# Patient Record
Sex: Male | Born: 1940
Health system: Southern US, Community
[De-identification: ages and names within clinical notes are randomized; demographics above are authoritative.]

## PROBLEM LIST (undated history)

## (undated) DIAGNOSIS — C61 Malignant neoplasm of prostate: Secondary | ICD-10-CM

## (undated) DIAGNOSIS — N200 Calculus of kidney: Secondary | ICD-10-CM

## (undated) DIAGNOSIS — I4891 Unspecified atrial fibrillation: Secondary | ICD-10-CM

## (undated) DIAGNOSIS — G4730 Sleep apnea, unspecified: Secondary | ICD-10-CM

## (undated) DIAGNOSIS — Z720 Tobacco use: Secondary | ICD-10-CM

## (undated) DIAGNOSIS — J449 Chronic obstructive pulmonary disease, unspecified: Secondary | ICD-10-CM

## (undated) DIAGNOSIS — N320 Bladder-neck obstruction: Secondary | ICD-10-CM

## (undated) DIAGNOSIS — Z9889 Other specified postprocedural states: Secondary | ICD-10-CM

## (undated) DIAGNOSIS — I10 Essential (primary) hypertension: Secondary | ICD-10-CM

## (undated) DIAGNOSIS — E7800 Pure hypercholesterolemia, unspecified: Secondary | ICD-10-CM

## (undated) DIAGNOSIS — Z8679 Personal history of other diseases of the circulatory system: Secondary | ICD-10-CM

## (undated) DIAGNOSIS — I7090 Unspecified atherosclerosis: Secondary | ICD-10-CM

## (undated) HISTORY — PX: PROSTATE BIOPSY: SHX241

## (undated) HISTORY — PX: ABDOMINAL AORTIC ANEURYSM REPAIR: SUR1152

## (undated) HISTORY — PX: CHOLECYSTECTOMY: SHX55

---

## 2014-06-04 ENCOUNTER — Ambulatory Visit (HOSPITAL_COMMUNITY)
Admission: RE | Admit: 2014-06-04 | Discharge: 2014-06-04 | Disposition: A | Discharge status: Home / Self Care | Payer: Medicare PPO | Source: Ambulatory Visit | Attending: Family Medicine | Admitting: Family Medicine

## 2014-06-04 ENCOUNTER — Other Ambulatory Visit (HOSPITAL_COMMUNITY): Payer: Self-pay | Admitting: Family Medicine

## 2014-06-04 DIAGNOSIS — Z87891 Personal history of nicotine dependence: Secondary | ICD-10-CM | POA: Insufficient documentation

## 2014-06-04 DIAGNOSIS — R05 Cough: Secondary | ICD-10-CM

## 2014-06-04 DIAGNOSIS — J439 Emphysema, unspecified: Secondary | ICD-10-CM | POA: Diagnosis not present

## 2014-06-04 DIAGNOSIS — F17200 Nicotine dependence, unspecified, uncomplicated: Secondary | ICD-10-CM

## 2014-06-04 DIAGNOSIS — R059 Cough, unspecified: Secondary | ICD-10-CM

## 2014-10-31 DIAGNOSIS — I10 Essential (primary) hypertension: Secondary | ICD-10-CM | POA: Diagnosis not present

## 2014-10-31 DIAGNOSIS — G4730 Sleep apnea, unspecified: Secondary | ICD-10-CM | POA: Diagnosis not present

## 2014-10-31 DIAGNOSIS — J449 Chronic obstructive pulmonary disease, unspecified: Secondary | ICD-10-CM | POA: Diagnosis not present

## 2014-10-31 DIAGNOSIS — F17200 Nicotine dependence, unspecified, uncomplicated: Secondary | ICD-10-CM | POA: Diagnosis not present

## 2014-12-28 DIAGNOSIS — G4733 Obstructive sleep apnea (adult) (pediatric): Secondary | ICD-10-CM | POA: Diagnosis not present

## 2015-01-27 DIAGNOSIS — G4733 Obstructive sleep apnea (adult) (pediatric): Secondary | ICD-10-CM | POA: Diagnosis not present

## 2015-02-06 ENCOUNTER — Emergency Department (HOSPITAL_COMMUNITY): Payer: Commercial Managed Care - HMO

## 2015-02-06 ENCOUNTER — Encounter (HOSPITAL_COMMUNITY): Payer: Self-pay

## 2015-02-06 ENCOUNTER — Emergency Department (HOSPITAL_COMMUNITY)
Admission: EM | Admit: 2015-02-06 | Discharge: 2015-02-06 | Disposition: A | Discharge status: Home / Self Care | Payer: Commercial Managed Care - HMO | Attending: Emergency Medicine | Admitting: Emergency Medicine

## 2015-02-06 DIAGNOSIS — R05 Cough: Secondary | ICD-10-CM | POA: Diagnosis not present

## 2015-02-06 DIAGNOSIS — J40 Bronchitis, not specified as acute or chronic: Secondary | ICD-10-CM | POA: Diagnosis not present

## 2015-02-06 DIAGNOSIS — Z7982 Long term (current) use of aspirin: Secondary | ICD-10-CM | POA: Diagnosis not present

## 2015-02-06 DIAGNOSIS — J449 Chronic obstructive pulmonary disease, unspecified: Secondary | ICD-10-CM | POA: Diagnosis not present

## 2015-02-06 DIAGNOSIS — J441 Chronic obstructive pulmonary disease with (acute) exacerbation: Secondary | ICD-10-CM | POA: Diagnosis not present

## 2015-02-06 DIAGNOSIS — R000 Tachycardia, unspecified: Secondary | ICD-10-CM | POA: Diagnosis not present

## 2015-02-06 DIAGNOSIS — R0602 Shortness of breath: Secondary | ICD-10-CM | POA: Diagnosis not present

## 2015-02-06 DIAGNOSIS — Z79899 Other long term (current) drug therapy: Secondary | ICD-10-CM | POA: Insufficient documentation

## 2015-02-06 DIAGNOSIS — E780 Pure hypercholesterolemia: Secondary | ICD-10-CM | POA: Diagnosis not present

## 2015-02-06 DIAGNOSIS — Z720 Tobacco use: Secondary | ICD-10-CM | POA: Diagnosis not present

## 2015-02-06 DIAGNOSIS — I10 Essential (primary) hypertension: Secondary | ICD-10-CM | POA: Insufficient documentation

## 2015-02-06 DIAGNOSIS — F17210 Nicotine dependence, cigarettes, uncomplicated: Secondary | ICD-10-CM | POA: Diagnosis not present

## 2015-02-06 HISTORY — DX: Essential (primary) hypertension: I10

## 2015-02-06 HISTORY — DX: Chronic obstructive pulmonary disease, unspecified: J44.9

## 2015-02-06 HISTORY — DX: Pure hypercholesterolemia, unspecified: E78.00

## 2015-02-06 LAB — BASIC METABOLIC PANEL
Anion gap: 12 (ref 5–15)
BUN: 40 mg/dL — ABNORMAL HIGH (ref 6–20)
CO2: 33 mmol/L — ABNORMAL HIGH (ref 22–32)
Calcium: 9.3 mg/dL (ref 8.9–10.3)
Chloride: 93 mmol/L — ABNORMAL LOW (ref 101–111)
Creatinine, Ser: 0.79 mg/dL (ref 0.61–1.24)
GFR calc Af Amer: >60 mL/min (ref >60)
GFR calc non Af Amer: >60 mL/min (ref >60)
Glucose, Bld: 132 mg/dL — ABNORMAL HIGH (ref 65–99)
Potassium: 3.7 mmol/L (ref 3.5–5.1)
Sodium: 138 mmol/L (ref 135–145)

## 2015-02-06 LAB — CBC
HCT: 40.6 % (ref 39.0–52.0)
HEMOGLOBIN: 13.5 g/dL (ref 13.0–17.0)
MCH: 30.2 pg (ref 26.0–34.0)
MCHC: 33.3 g/dL (ref 30.0–36.0)
MCV: 90.8 fL (ref 78.0–100.0)
Platelets: 259 10*3/uL (ref 150–400)
RBC: 4.47 MIL/uL (ref 4.22–5.81)
RDW: 13.7 % (ref 11.5–15.5)
WBC: 23.7 10*3/uL — AB (ref 4.0–10.5)

## 2015-02-06 LAB — TROPONIN I: TROPONIN I: 0.03 ng/mL (ref <0.031)

## 2015-02-06 MED ORDER — PREDNISONE 50 MG PO TABS: ORAL_TABLET | ORAL | Status: DC

## 2015-02-06 MED ORDER — IPRATROPIUM-ALBUTEROL 0.5-2.5 (3) MG/3ML IN SOLN
3.0000 mL | Freq: Once | RESPIRATORY_TRACT | Status: AC
  Administered 2015-02-06: 3 mL via RESPIRATORY_TRACT
  Filled 2015-02-06: qty 3

## 2015-02-06 MED ORDER — AZITHROMYCIN 250 MG PO TABS: ORAL_TABLET | ORAL | Status: DC

## 2015-02-06 MED ORDER — ALBUTEROL SULFATE HFA 108 (90 BASE) MCG/ACT IN AERS: 1.0000 | INHALATION_SPRAY | Freq: Four times a day (QID) | RESPIRATORY_TRACT | Status: DC | PRN

## 2015-02-06 NOTE — Discharge Instructions (Signed)
X-ray shows no obvious pneumonia. Stop smoking. Prescription for antibiotic, prednisone, inhaler. Follow-up your care doctor.

## 2015-02-06 NOTE — ED Notes (Signed)
Patient reports that he started coughing Monday. Some SOB. Productive cough. Went to urgent care this am and was sent here due to sats in 80's

## 2015-02-06 NOTE — ED Provider Notes (Signed)
CSN: [id number]     Arrival date & time 02/06/15  1108 History  This chart was scribed for Nat Christen, MD by Rayna Sexton, ED scribe. This patient was seen in room APA03/APA03 and the patient's care was started at 12:01 PM.    Chief Complaint  Patient presents with  . Shortness of Breath  . Cough    The history is provided by the patient and the spouse. No language interpreter was used.    HPI Comments: Steve Gomez is a 74 y.o. male, with a history of COPD, smoking and HTN, who presents to the Emergency Department complaining of an intermittent, moderate, productive cough with onset 4 days ago.  Pt confirms intermittent SOB. Pt's wife notes he uses oxygen when sleeping but denies use of any at home breathing treatments or his prescribed inhalers. Pt's wife additionally mentioned that he is still a regular smoker. Pt denies wheezing or any other associated symptoms.    Past Medical History  Diagnosis Date  . COPD (chronic obstructive pulmonary disease)   . Hypertension   . High cholesterol    History reviewed. No pertinent past surgical history. No family history on file. History  Substance Use Topics  . Smoking status: Current Every Day Smoker -- 1.00 packs/day    Types: Cigarettes  . Smokeless tobacco: Not on file  . Alcohol Use: No    Review of Systems  Respiratory: Positive for cough and shortness of breath.   A complete 10 system review of systems was obtained and all systems are negative except as noted in the HPI and PMH.      Allergies  Review of patient's allergies indicates no known allergies.  Home Medications   Prior to Admission medications   Medication Sig Start Date End Date Taking? Authorizing Provider  aspirin EC 81 MG tablet Take 81 mg by mouth daily.   Yes Historical Provider, MD  chlorthalidone (HYGROTON) 25 MG tablet Take 1 tablet by mouth daily. 12/01/14  Yes Historical Provider, MD  Coenzyme Q10 (COQ10 PO) Take 1 tablet by mouth daily.   Yes  Historical Provider, MD  labetalol (NORMODYNE) 300 MG tablet Take 1 tablet by mouth 2 (two) times daily. 12/01/14  Yes Historical Provider, MD  Multiple Vitamins-Minerals (MULTIVITAMINS THER. W/MINERALS) TABS tablet Take 1 tablet by mouth daily. 11/30/14  Yes Historical Provider, MD  simvastatin (ZOCOR) 40 MG tablet Take 1 tablet by mouth daily. 12/01/14  Yes Historical Provider, MD  albuterol (PROVENTIL HFA;VENTOLIN HFA) 108 (90 BASE) MCG/ACT inhaler Inhale 1-2 puffs into the lungs every 6 (six) hours as needed for wheezing or shortness of breath. 02/06/15   Nat Christen, MD  azithromycin (ZITHROMAX Z-PAK) 250 MG tablet Two tabs day one, one tab day 2-5 02/06/15   Nat Christen, MD  predniSONE (DELTASONE) 50 MG tablet 1 tablet for 5 days, one half tablet for 5 days 02/06/15   Nat Christen, MD   BP 120/64 mmHg  Pulse 75  Temp(Src) 98.4 F (36.9 C) (Oral)  Resp 33  Ht 5\' 4"  (1.626 m)  Wt 130 lb (58.968 kg)  BMI 22.30 kg/m2  SpO2 92% Physical Exam  Constitutional: He is oriented to person, place, and time.  HENT:  Head: Normocephalic and atraumatic.  Eyes: Conjunctivae and EOM are normal. Pupils are equal, round, and reactive to light.  Neck: Normal range of motion. Neck supple.  Cardiovascular: Normal rate and regular rhythm.   Pulmonary/Chest:  Slight tachypnea and dyspnea Scattered rhonchi   Abdominal: Soft.  Bowel sounds are normal.  Musculoskeletal: Normal range of motion.  Neurological: He is alert and oriented to person, place, and time.  Skin: Skin is warm and dry.  Psychiatric: He has a normal mood and affect. His behavior is normal.  Nursing note and vitals reviewed.   ED Course  Procedures  DIAGNOSTIC STUDIES:  Oxygen Saturation is 84% on RA, low by my interpretation.    COORDINATION OF CARE: 12:05 PM Discussed treatment plan with pt at bedside including a breathing treatment and a CXR and pt agreed to plan.  Labs Review Labs Reviewed  BASIC METABOLIC PANEL - Abnormal; Notable for  the following:    Chloride 93 (*)    CO2 33 (*)    Glucose, Bld 132 (*)    BUN 40 (*)    All other components within normal limits  CBC - Abnormal; Notable for the following:    WBC 23.7 (*)    All other components within normal limits  TROPONIN I    Imaging Review Dg Chest 2 View (if Patient Has Fever And/or Copd)  02/06/2015   CLINICAL DATA:  Shortness of breath and productive cough. History of COPD. Current smoker.  EXAM: CHEST - 2 VIEW  COMPARISON:  06/04/2014  FINDINGS: Lungs show marked hyperinflation. Changes of COPD/emphysema appear slightly progressive since the prior chest x-ray. There is no evidence of pulmonary edema, consolidation, pneumothorax, nodule or pleural fluid. The heart size is stable and within normal limits. There is stable tortuosity of the thoracic aorta. Stable spondylosis and scoliosis of the thoracic spine.  IMPRESSION: Marked pulmonary hyperinflation with some probable progression of COPD/emphysema since the prior study. No focal infiltrate or evidence of pneumothorax.   Electronically Signed   By: Aletta Edouard M.D.   On: 02/06/2015 12:38     EKG Interpretation   Date/Time:  Thursday February 06 2015 11:30:24 EDT Ventricular Rate:  81 PR Interval:  171 QRS Duration: 126 QT Interval:  399 QTC Calculation: 463 R Axis:   -81 Text Interpretation:  Sinus rhythm Atrial premature complexes Nonspecific  IVCD with LAD Nonspecific T abnormalities, lateral leads ST elevation,  consider inferior injury Baseline wander in lead(s) II III aVF V3  Confirmed by Tajon Moring  MD, Musette Kisamore ([id number]) on 02/06/2015 12:59:42 PM      MDM   Final diagnoses:  Bronchitis  COPD exacerbation    Patient has known severe COPD. He is normally on home oxygen as needed. Chest x-ray shows no obvious pneumonia. His wife reports him to be at baseline. Will Rx Zithromax, prednisone, albuterol inhaler.   I personally performed the services described in this documentation, which was scribed in my  presence. The recorded information has been reviewed and is accurate.    Nat Christen, MD 02/06/15 1436

## 2015-02-11 DIAGNOSIS — G4733 Obstructive sleep apnea (adult) (pediatric): Secondary | ICD-10-CM | POA: Diagnosis not present

## 2015-02-27 DIAGNOSIS — G4733 Obstructive sleep apnea (adult) (pediatric): Secondary | ICD-10-CM | POA: Diagnosis not present

## 2015-03-24 ENCOUNTER — Other Ambulatory Visit (HOSPITAL_COMMUNITY): Payer: Self-pay | Admitting: Pulmonary Disease

## 2015-03-24 DIAGNOSIS — R0602 Shortness of breath: Secondary | ICD-10-CM

## 2015-03-24 DIAGNOSIS — R634 Abnormal weight loss: Secondary | ICD-10-CM | POA: Diagnosis not present

## 2015-03-27 ENCOUNTER — Encounter (HOSPITAL_COMMUNITY): Payer: Self-pay

## 2015-03-27 ENCOUNTER — Ambulatory Visit (HOSPITAL_COMMUNITY)
Admission: RE | Admit: 2015-03-27 | Discharge: 2015-03-27 | Disposition: A | Discharge status: Home / Self Care | Payer: Commercial Managed Care - HMO | Source: Ambulatory Visit | Attending: Pulmonary Disease | Admitting: Pulmonary Disease

## 2015-03-27 DIAGNOSIS — I2510 Atherosclerotic heart disease of native coronary artery without angina pectoris: Secondary | ICD-10-CM | POA: Diagnosis not present

## 2015-03-27 DIAGNOSIS — I700 Atherosclerosis of aorta: Secondary | ICD-10-CM | POA: Diagnosis not present

## 2015-03-27 DIAGNOSIS — R918 Other nonspecific abnormal finding of lung field: Secondary | ICD-10-CM | POA: Diagnosis not present

## 2015-03-27 DIAGNOSIS — R0602 Shortness of breath: Secondary | ICD-10-CM | POA: Diagnosis not present

## 2015-03-27 DIAGNOSIS — J984 Other disorders of lung: Secondary | ICD-10-CM | POA: Diagnosis not present

## 2015-03-27 HISTORY — DX: Other specified postprocedural states: Z86.79

## 2015-03-27 HISTORY — DX: Other specified postprocedural states: Z98.890

## 2015-03-27 LAB — POCT I-STAT CREATININE: CREATININE: 0.7 mg/dL (ref 0.61–1.24)

## 2015-03-27 MED ORDER — IOHEXOL 300 MG/ML  SOLN
75.0000 mL | Freq: Once | INTRAMUSCULAR | Status: AC | PRN
  Administered 2015-03-27: 75 mL via INTRAVENOUS

## 2015-03-29 DIAGNOSIS — G4733 Obstructive sleep apnea (adult) (pediatric): Secondary | ICD-10-CM | POA: Diagnosis not present

## 2015-04-24 DIAGNOSIS — G4733 Obstructive sleep apnea (adult) (pediatric): Secondary | ICD-10-CM | POA: Diagnosis not present

## 2015-04-29 DIAGNOSIS — G4733 Obstructive sleep apnea (adult) (pediatric): Secondary | ICD-10-CM | POA: Diagnosis not present

## 2015-05-07 DIAGNOSIS — I10 Essential (primary) hypertension: Secondary | ICD-10-CM | POA: Diagnosis not present

## 2015-05-07 DIAGNOSIS — J449 Chronic obstructive pulmonary disease, unspecified: Secondary | ICD-10-CM | POA: Diagnosis not present

## 2015-05-07 DIAGNOSIS — R634 Abnormal weight loss: Secondary | ICD-10-CM | POA: Diagnosis not present

## 2015-05-07 DIAGNOSIS — G4733 Obstructive sleep apnea (adult) (pediatric): Secondary | ICD-10-CM | POA: Diagnosis not present

## 2015-05-30 DIAGNOSIS — G4733 Obstructive sleep apnea (adult) (pediatric): Secondary | ICD-10-CM | POA: Diagnosis not present

## 2015-06-19 DIAGNOSIS — G4733 Obstructive sleep apnea (adult) (pediatric): Secondary | ICD-10-CM | POA: Diagnosis not present

## 2015-06-29 DIAGNOSIS — G4733 Obstructive sleep apnea (adult) (pediatric): Secondary | ICD-10-CM | POA: Diagnosis not present

## 2015-07-07 DIAGNOSIS — S42293A Other displaced fracture of upper end of unspecified humerus, initial encounter for closed fracture: Secondary | ICD-10-CM | POA: Diagnosis not present

## 2015-07-14 ENCOUNTER — Ambulatory Visit (INDEPENDENT_AMBULATORY_CARE_PROVIDER_SITE_OTHER): Payer: Commercial Managed Care - HMO | Admitting: Orthopedic Surgery

## 2015-07-14 ENCOUNTER — Ambulatory Visit (INDEPENDENT_AMBULATORY_CARE_PROVIDER_SITE_OTHER): Payer: Commercial Managed Care - HMO

## 2015-07-14 VITALS — BP 148/87 | Ht 64.0 in | Wt 130.0 lb

## 2015-07-14 DIAGNOSIS — M25512 Pain in left shoulder: Secondary | ICD-10-CM

## 2015-07-14 NOTE — Patient Instructions (Signed)
Do exercises daily   Take sling off Thursday

## 2015-07-14 NOTE — Addendum Note (Signed)
Addended by: Arther Abbott E on: 07/14/2015 12:00 PM   Modules accepted: Level of Service

## 2015-07-14 NOTE — Progress Notes (Signed)
Patient ID: Steve Gomez, male   DOB: [birth year], 74 y.o.   MRN: [id number]  Chief Complaint  Patient presents with  . Shoulder Injury    left shoulder fracture, DOI 07/06/15 referred by Susann Givens    HPI Steve Gomez is a 74 y.o. male.  Left shoulder injury one week ago on November 6. Patient fell while helping his relatives put a roof on a house. He was on the ground fell backwards put his left arm back and felt pain and swelling. He was seen at urgent care and was diagnosed with a nondisplaced proximal humerus fracture of the left shoulder.  Now complains of a dull aching pain 2 out of 10 left proximal humerus with no radiation but pain with movement  Review of systems neurologic denies numbness or tingling skin no lacerations or rashes fever constitutional negative    Review of Systems Review of Systems  Past Medical History  Diagnosis Date  . COPD (chronic obstructive pulmonary disease)   . Hypertension   . High cholesterol   . S/P AAA repair     Social History Social History  Substance Use Topics  . Smoking status: Current Every Day Smoker -- 1.00 packs/day    Types: Cigarettes  . Smokeless tobacco: Not on file  . Alcohol Use: No    No Known Allergies  Current Outpatient Prescriptions  Medication Sig Dispense Refill  . aspirin EC 81 MG tablet Take 81 mg by mouth daily.    . chlorthalidone (HYGROTON) 25 MG tablet Take 1 tablet by mouth daily.    . Coenzyme Q10 (COQ10 PO) Take 1 tablet by mouth daily.    Marland Kitchen labetalol (NORMODYNE) 300 MG tablet Take 1 tablet by mouth 2 (two) times daily.    . Multiple Vitamins-Minerals (MULTIVITAMINS THER. W/MINERALS) TABS tablet Take 1 tablet by mouth daily.    . simvastatin (ZOCOR) 40 MG tablet Take 1 tablet by mouth daily.     No current facility-administered medications for this visit.       Physical Exam Physical Exam Blood pressure 148/87, height 5\' 4"  (1.626 m), weight 130 lb (58.968 kg). Appearance, there are no  abnormalities in terms of appearance the patient was well-developed and well-nourished. The grooming and hygiene were normal.  Mental status orientation, there was normal alertness and orientation Mood pleasant Ambulatory status normal with no assistive devices  Examination of the left shoulder and proximal humerus Inspection small effusion left shoulder Range of motion very limited range of motion in all planes Tests for stability inferior subluxation test was normal Motor strength  no gross motor atrophy Skin warm dry and intact without laceration or ulceration or erythema Neurologic examination normal sensation Vascular examination normal pulses with warm extremity and normal capillary refill  The opposite extremity no tenderness or swelling    Data Reviewed He had 3 x-rays on a disc AP with internal and extra rotation and they showed glenohumeral arthritis, reports this fracture I don't see  Scapular Y view orthogonal views and still don't see a fracture  Assessment  Possible fracture left proximal humerus   Plan  Recommend active assisted range of motion exercises and four-week follow-up, he can remove the sling  Will not charge for fracture

## 2015-07-17 NOTE — Addendum Note (Signed)
Addended by: Carole Civil on: 07/17/2015 07:32 AM   Modules accepted: Level of Service

## 2015-07-18 ENCOUNTER — Other Ambulatory Visit (HOSPITAL_COMMUNITY): Payer: Self-pay | Admitting: Pulmonary Disease

## 2015-07-18 DIAGNOSIS — R911 Solitary pulmonary nodule: Secondary | ICD-10-CM

## 2015-07-20 DIAGNOSIS — G4733 Obstructive sleep apnea (adult) (pediatric): Secondary | ICD-10-CM | POA: Diagnosis not present

## 2015-07-23 ENCOUNTER — Ambulatory Visit (HOSPITAL_COMMUNITY)
Admission: RE | Admit: 2015-07-23 | Discharge: 2015-07-23 | Disposition: A | Discharge status: Home / Self Care | Payer: Commercial Managed Care - HMO | Source: Ambulatory Visit | Attending: Pulmonary Disease | Admitting: Pulmonary Disease

## 2015-07-23 DIAGNOSIS — R911 Solitary pulmonary nodule: Secondary | ICD-10-CM | POA: Diagnosis not present

## 2015-07-23 DIAGNOSIS — I2510 Atherosclerotic heart disease of native coronary artery without angina pectoris: Secondary | ICD-10-CM | POA: Diagnosis not present

## 2015-07-23 DIAGNOSIS — R918 Other nonspecific abnormal finding of lung field: Secondary | ICD-10-CM | POA: Insufficient documentation

## 2015-07-23 DIAGNOSIS — J432 Centrilobular emphysema: Secondary | ICD-10-CM | POA: Diagnosis not present

## 2015-07-23 DIAGNOSIS — N200 Calculus of kidney: Secondary | ICD-10-CM | POA: Diagnosis not present

## 2015-07-30 DIAGNOSIS — G4733 Obstructive sleep apnea (adult) (pediatric): Secondary | ICD-10-CM | POA: Diagnosis not present

## 2015-08-18 ENCOUNTER — Ambulatory Visit (INDEPENDENT_AMBULATORY_CARE_PROVIDER_SITE_OTHER): Payer: Commercial Managed Care - HMO | Admitting: Orthopedic Surgery

## 2015-08-18 ENCOUNTER — Encounter: Payer: Self-pay | Admitting: Orthopedic Surgery

## 2015-08-18 VITALS — BP 132/75 | Ht 64.0 in | Wt 130.0 lb

## 2015-08-18 DIAGNOSIS — M25512 Pain in left shoulder: Secondary | ICD-10-CM | POA: Diagnosis not present

## 2015-08-18 NOTE — Progress Notes (Signed)
Regular follow up shoulder pain left side  Chief Complaint  Patient presents with  . Follow-up    4 WEEK FOLLOW UP LEFT SHOULDER, DOI 07/06/15    The patient is left shoulder back in November was questionable fracture I didn't really think the x-ray showed fracture so we treated him for shoulder pain with injury and contusion  He comes in complaining of no discomfort at this time  Review of systems he denies any numbness or tingling in the left arm  BP 132/75 mmHg  Ht 5\' 4"  (1.626 m)  Wt 130 lb (58.968 kg)  BMI 22.30 kg/m2 He has no tenderness in the left shoulder he has no deformity compared right to left he has regained his preinjury range of motion which includes 150 of forward elevation and 50 of external rotation. His motor exam for his rotator cuff is grade 5 over 5 for the supraspinatus. His shoulder remain stable and his neurovascular exam is normal  Encounter Diagnosis  Name Primary?  . Left shoulder pain Yes    He can resume normal activities and follow-up with [place] as needed

## 2015-08-19 DIAGNOSIS — G4733 Obstructive sleep apnea (adult) (pediatric): Secondary | ICD-10-CM | POA: Diagnosis not present

## 2015-08-29 DIAGNOSIS — G4733 Obstructive sleep apnea (adult) (pediatric): Secondary | ICD-10-CM | POA: Diagnosis not present

## 2015-09-03 DIAGNOSIS — H5210 Myopia, unspecified eye: Secondary | ICD-10-CM | POA: Diagnosis not present

## 2015-09-03 DIAGNOSIS — H524 Presbyopia: Secondary | ICD-10-CM | POA: Diagnosis not present

## 2015-09-19 DIAGNOSIS — G4733 Obstructive sleep apnea (adult) (pediatric): Secondary | ICD-10-CM | POA: Diagnosis not present

## 2015-09-29 DIAGNOSIS — G4733 Obstructive sleep apnea (adult) (pediatric): Secondary | ICD-10-CM | POA: Diagnosis not present

## 2015-10-20 DIAGNOSIS — G4733 Obstructive sleep apnea (adult) (pediatric): Secondary | ICD-10-CM | POA: Diagnosis not present

## 2015-10-28 DIAGNOSIS — G4733 Obstructive sleep apnea (adult) (pediatric): Secondary | ICD-10-CM | POA: Diagnosis not present

## 2015-11-17 DIAGNOSIS — G4733 Obstructive sleep apnea (adult) (pediatric): Secondary | ICD-10-CM | POA: Diagnosis not present

## 2015-11-27 DIAGNOSIS — G4733 Obstructive sleep apnea (adult) (pediatric): Secondary | ICD-10-CM | POA: Diagnosis not present

## 2015-12-18 DIAGNOSIS — G4733 Obstructive sleep apnea (adult) (pediatric): Secondary | ICD-10-CM | POA: Diagnosis not present

## 2015-12-28 DIAGNOSIS — G4733 Obstructive sleep apnea (adult) (pediatric): Secondary | ICD-10-CM | POA: Diagnosis not present

## 2016-01-11 ENCOUNTER — Encounter (HOSPITAL_COMMUNITY): Payer: Self-pay | Admitting: Emergency Medicine

## 2016-01-11 ENCOUNTER — Inpatient Hospital Stay (HOSPITAL_COMMUNITY)
Admission: EM | Admit: 2016-01-11 | Discharge: 2016-01-17 | DRG: 699 | Disposition: A | Discharge status: Home / Self Care | Payer: Commercial Managed Care - HMO | Attending: Internal Medicine | Admitting: Internal Medicine

## 2016-01-11 ENCOUNTER — Emergency Department (HOSPITAL_COMMUNITY): Payer: Commercial Managed Care - HMO

## 2016-01-11 DIAGNOSIS — R338 Other retention of urine: Secondary | ICD-10-CM | POA: Diagnosis not present

## 2016-01-11 DIAGNOSIS — I10 Essential (primary) hypertension: Secondary | ICD-10-CM | POA: Diagnosis not present

## 2016-01-11 DIAGNOSIS — K5710 Diverticulosis of small intestine without perforation or abscess without bleeding: Secondary | ICD-10-CM | POA: Diagnosis present

## 2016-01-11 DIAGNOSIS — N202 Calculus of kidney with calculus of ureter: Secondary | ICD-10-CM | POA: Diagnosis present

## 2016-01-11 DIAGNOSIS — E876 Hypokalemia: Secondary | ICD-10-CM | POA: Diagnosis not present

## 2016-01-11 DIAGNOSIS — R1084 Generalized abdominal pain: Secondary | ICD-10-CM | POA: Diagnosis not present

## 2016-01-11 DIAGNOSIS — F17210 Nicotine dependence, cigarettes, uncomplicated: Secondary | ICD-10-CM | POA: Diagnosis not present

## 2016-01-11 DIAGNOSIS — K559 Vascular disorder of intestine, unspecified: Secondary | ICD-10-CM | POA: Diagnosis present

## 2016-01-11 DIAGNOSIS — R1030 Lower abdominal pain, unspecified: Secondary | ICD-10-CM | POA: Diagnosis not present

## 2016-01-11 DIAGNOSIS — N3090 Cystitis, unspecified without hematuria: Secondary | ICD-10-CM | POA: Diagnosis present

## 2016-01-11 DIAGNOSIS — N320 Bladder-neck obstruction: Secondary | ICD-10-CM | POA: Diagnosis not present

## 2016-01-11 DIAGNOSIS — R109 Unspecified abdominal pain: Secondary | ICD-10-CM | POA: Diagnosis not present

## 2016-01-11 DIAGNOSIS — N200 Calculus of kidney: Secondary | ICD-10-CM | POA: Diagnosis not present

## 2016-01-11 DIAGNOSIS — R1010 Upper abdominal pain, unspecified: Secondary | ICD-10-CM | POA: Diagnosis not present

## 2016-01-11 DIAGNOSIS — I708 Atherosclerosis of other arteries: Secondary | ICD-10-CM | POA: Diagnosis not present

## 2016-01-11 DIAGNOSIS — E7800 Pure hypercholesterolemia, unspecified: Secondary | ICD-10-CM | POA: Diagnosis not present

## 2016-01-11 DIAGNOSIS — R0602 Shortness of breath: Secondary | ICD-10-CM | POA: Diagnosis not present

## 2016-01-11 DIAGNOSIS — Z79899 Other long term (current) drug therapy: Secondary | ICD-10-CM | POA: Diagnosis not present

## 2016-01-11 DIAGNOSIS — J449 Chronic obstructive pulmonary disease, unspecified: Secondary | ICD-10-CM | POA: Diagnosis present

## 2016-01-11 DIAGNOSIS — D72829 Elevated white blood cell count, unspecified: Secondary | ICD-10-CM | POA: Diagnosis not present

## 2016-01-11 DIAGNOSIS — G4733 Obstructive sleep apnea (adult) (pediatric): Secondary | ICD-10-CM | POA: Diagnosis not present

## 2016-01-11 DIAGNOSIS — R1032 Left lower quadrant pain: Secondary | ICD-10-CM | POA: Diagnosis not present

## 2016-01-11 DIAGNOSIS — I959 Hypotension, unspecified: Secondary | ICD-10-CM | POA: Diagnosis not present

## 2016-01-11 DIAGNOSIS — R1011 Right upper quadrant pain: Secondary | ICD-10-CM | POA: Diagnosis present

## 2016-01-11 DIAGNOSIS — R1012 Left upper quadrant pain: Secondary | ICD-10-CM | POA: Diagnosis not present

## 2016-01-11 DIAGNOSIS — R112 Nausea with vomiting, unspecified: Secondary | ICD-10-CM | POA: Diagnosis not present

## 2016-01-11 DIAGNOSIS — I4891 Unspecified atrial fibrillation: Secondary | ICD-10-CM | POA: Diagnosis present

## 2016-01-11 DIAGNOSIS — R5381 Other malaise: Secondary | ICD-10-CM | POA: Diagnosis not present

## 2016-01-11 DIAGNOSIS — Z7982 Long term (current) use of aspirin: Secondary | ICD-10-CM | POA: Diagnosis not present

## 2016-01-11 HISTORY — DX: Bladder-neck obstruction: N32.0

## 2016-01-11 HISTORY — DX: Unspecified atherosclerosis: I70.90

## 2016-01-11 HISTORY — DX: Calculus of kidney: N20.0

## 2016-01-11 HISTORY — DX: Tobacco use: Z72.0

## 2016-01-11 HISTORY — DX: Unspecified atrial fibrillation: I48.91

## 2016-01-11 LAB — CBC WITH DIFFERENTIAL/PLATELET
BASOS ABS: 0 10*3/uL (ref 0.0–0.1)
BASOS PCT: 0 %
EOS ABS: 0.1 10*3/uL (ref 0.0–0.7)
Eosinophils Relative: 1 %
HCT: 32.4 % — ABNORMAL LOW (ref 39.0–52.0)
Hemoglobin: 11 g/dL — ABNORMAL LOW (ref 13.0–17.0)
Lymphocytes Relative: 8 %
Lymphs Abs: 1.6 10*3/uL (ref 0.7–4.0)
MCH: 29.6 pg (ref 26.0–34.0)
MCHC: 34 g/dL (ref 30.0–36.0)
MCV: 87.3 fL (ref 78.0–100.0)
MONOS PCT: 7 %
Monocytes Absolute: 1.4 10*3/uL — ABNORMAL HIGH (ref 0.1–1.0)
NEUTROS ABS: 16.7 10*3/uL — AB (ref 1.7–7.7)
Neutrophils Relative %: 84 %
Platelets: 332 10*3/uL (ref 150–400)
RBC: 3.71 MIL/uL — ABNORMAL LOW (ref 4.22–5.81)
RDW: 14.3 % (ref 11.5–15.5)
WBC: 19.8 10*3/uL — ABNORMAL HIGH (ref 4.0–10.5)

## 2016-01-11 LAB — COMPREHENSIVE METABOLIC PANEL
ALK PHOS: 124 U/L (ref 38–126)
ALT: 12 U/L — AB (ref 17–63)
ANION GAP: 9 (ref 5–15)
AST: 20 U/L (ref 15–41)
Albumin: 3.5 g/dL (ref 3.5–5.0)
BILIRUBIN TOTAL: 0.5 mg/dL (ref 0.3–1.2)
BUN: 24 mg/dL — ABNORMAL HIGH (ref 6–20)
CALCIUM: 9.2 mg/dL (ref 8.9–10.3)
CO2: 31 mmol/L (ref 22–32)
CREATININE: 0.78 mg/dL (ref 0.61–1.24)
Chloride: 94 mmol/L — ABNORMAL LOW (ref 101–111)
GFR calc Af Amer: >60 mL/min (ref >60)
GFR calc non Af Amer: >60 mL/min (ref >60)
Glucose, Bld: 129 mg/dL — ABNORMAL HIGH (ref 65–99)
Potassium: 2.8 mmol/L — ABNORMAL LOW (ref 3.5–5.1)
SODIUM: 134 mmol/L — AB (ref 135–145)
TOTAL PROTEIN: 6.9 g/dL (ref 6.5–8.1)

## 2016-01-11 LAB — URINALYSIS, ROUTINE W REFLEX MICROSCOPIC
BILIRUBIN URINE: NEGATIVE
Glucose, UA: NEGATIVE mg/dL
Hgb urine dipstick: NEGATIVE
KETONES UR: NEGATIVE mg/dL
LEUKOCYTES UA: NEGATIVE
NITRITE: NEGATIVE
Protein, ur: NEGATIVE mg/dL
Specific Gravity, Urine: 1.015 (ref 1.005–1.030)
pH: 6.5 (ref 5.0–8.0)

## 2016-01-11 LAB — PROTIME-INR
INR: 1.24 (ref 0.00–1.49)
Prothrombin Time: 15.8 seconds — ABNORMAL HIGH (ref 11.6–15.2)

## 2016-01-11 LAB — APTT: aPTT: 32 seconds (ref 24–37)

## 2016-01-11 LAB — TROPONIN I

## 2016-01-11 LAB — LIPASE, BLOOD: Lipase: 31 U/L (ref 11–51)

## 2016-01-11 MED ORDER — SODIUM CHLORIDE 0.9 % IV BOLUS (SEPSIS)
500.0000 mL | Freq: Once | INTRAVENOUS | Status: AC
  Administered 2016-01-11: 500 mL via INTRAVENOUS

## 2016-01-11 MED ORDER — SODIUM CHLORIDE 0.9 % IV BOLUS (SEPSIS)
1000.0000 mL | Freq: Once | INTRAVENOUS | Status: AC
  Administered 2016-01-11: 1000 mL via INTRAVENOUS

## 2016-01-11 MED ORDER — IOPAMIDOL (ISOVUE-300) INJECTION 61%
100.0000 mL | Freq: Once | INTRAVENOUS | Status: AC | PRN
  Administered 2016-01-11: 100 mL via INTRAVENOUS

## 2016-01-11 MED ORDER — DILTIAZEM HCL 100 MG IV SOLR
5.0000 mg/h | Freq: Once | INTRAVENOUS | Status: AC
  Administered 2016-01-12: 5 mg/h via INTRAVENOUS
  Filled 2016-01-11: qty 100

## 2016-01-11 MED ORDER — ONDANSETRON HCL 4 MG/2ML IJ SOLN
4.0000 mg | Freq: Once | INTRAMUSCULAR | Status: AC
  Administered 2016-01-11: 4 mg via INTRAVENOUS
  Filled 2016-01-11: qty 2

## 2016-01-11 MED ORDER — MORPHINE SULFATE (PF) 4 MG/ML IV SOLN
4.0000 mg | Freq: Once | INTRAVENOUS | Status: AC
  Administered 2016-01-11: 4 mg via INTRAVENOUS
  Filled 2016-01-11: qty 1

## 2016-01-11 MED ORDER — HEPARIN BOLUS VIA INFUSION: 3400.0000 [IU] | Freq: Once | INTRAVENOUS | Status: DC

## 2016-01-11 MED ORDER — POTASSIUM CHLORIDE 10 MEQ/100ML IV SOLN
10.0000 meq | Freq: Once | INTRAVENOUS | Status: AC
  Administered 2016-01-11: 10 meq via INTRAVENOUS
  Filled 2016-01-11: qty 100

## 2016-01-11 MED ORDER — HEPARIN (PORCINE) IN NACL 100-0.45 UNIT/ML-% IJ SOLN: 850.0000 [IU]/h | INTRAMUSCULAR | Status: DC

## 2016-01-11 MED ORDER — HEPARIN SODIUM (PORCINE) 5000 UNIT/ML IJ SOLN: 60.0000 [IU]/kg | Freq: Once | INTRAMUSCULAR | Status: DC

## 2016-01-11 NOTE — ED Notes (Signed)
Patient complaining of abdominal pain x 1 week. States he vomited x 1 today.

## 2016-01-11 NOTE — ED Provider Notes (Signed)
CSN: [id number]     Arrival date & time 01/11/16  1859 History   First MD Initiated Contact with Patient 01/11/16 1909     Chief Complaint  Patient presents with  . Abdominal Pain   PT SAID THAT HE HAS HAD ABD PAIN FOR THE PAST WEEK.  PT DID VOMIT ONCE TODAY.  PT DENIES ANY PAIN RIGHT NOW.  (Consider location/radiation/quality/duration/timing/severity/associated sxs/prior Treatment) Patient is a 75 y.o. male presenting with abdominal pain. The history is provided by the patient.  Abdominal Pain Pain location:  Generalized Pain quality: aching   Pain radiates to:  Does not radiate Pain severity:  Mild Onset quality:  Gradual Timing:  Intermittent Chronicity:  New Associated symptoms: nausea and vomiting     Past Medical History  Diagnosis Date  . COPD (chronic obstructive pulmonary disease) ([place])   . Hypertension   . High cholesterol   . S/P AAA repair    History reviewed. No pertinent past surgical history. History reviewed. No pertinent family history. Social History  Substance Use Topics  . Smoking status: Current Every Day Smoker -- 1.00 packs/day    Types: Cigarettes  . Smokeless tobacco: None  . Alcohol Use: Yes     Comment: "just a couple of swallows of wine every night"    Review of Systems  Gastrointestinal: Positive for nausea, vomiting and abdominal pain.  All other systems reviewed and are negative.     Allergies  Review of patient's allergies indicates no known allergies.  Home Medications   Prior to Admission medications   Medication Sig Start Date End Date Taking? Authorizing Provider  acetaminophen (TYLENOL) 500 MG tablet Take 500 mg by mouth every 6 (six) hours as needed for mild pain or moderate pain.   Yes Historical Provider, MD  aspirin EC 81 MG tablet Take 81 mg by mouth daily.   Yes Historical Provider, MD  chlorthalidone (HYGROTON) 25 MG tablet Take 1 tablet by mouth daily. 12/01/14  Yes Historical Provider, MD  labetalol (NORMODYNE) 300  MG tablet Take 1 tablet by mouth 2 (two) times daily. 12/01/14  Yes Historical Provider, MD  Multiple Vitamins-Minerals (MULTIVITAMINS THER. W/MINERALS) TABS tablet Take 1 tablet by mouth daily. 11/30/14  Yes Historical Provider, MD  OXYGEN Inhale 2 L into the lungs at bedtime.   Yes Historical Provider, MD  simvastatin (ZOCOR) 40 MG tablet Take 1 tablet by mouth daily. 12/01/14  Yes Historical Provider, MD   BP 122/76 mmHg  Pulse 102  Temp(Src) 98.4 F (36.9 C) (Oral)  Resp 20  Ht 5\' 4"  (1.626 m)  Wt 116 lb 13.5 oz (53 kg)  BMI 20.05 kg/m2  SpO2 95% Physical Exam  Constitutional: He is oriented to person, place, and time. He appears well-developed and well-nourished.  HENT:  Head: Normocephalic and atraumatic.  Right Ear: External ear normal.  Left Ear: External ear normal.  Nose: Nose normal.  Mouth/Throat: Oropharynx is clear and moist.  Eyes: Conjunctivae and EOM are normal. Pupils are equal, round, and reactive to light.  Neck: Normal range of motion. Neck supple.  Cardiovascular: Normal rate, regular rhythm, normal heart sounds and intact distal pulses.   Pulmonary/Chest: Effort normal and breath sounds normal.  Abdominal: Soft. Bowel sounds are normal.  Musculoskeletal: Normal range of motion.  Neurological: He is alert and oriented to person, place, and time.  Skin: Skin is warm and dry.  Psychiatric: He has a normal mood and affect. His behavior is normal. Judgment and thought content normal.  Nursing note and vitals reviewed.   ED Course  Procedures (including critical care time) Labs Review Labs Reviewed  COMPREHENSIVE METABOLIC PANEL - Abnormal; Notable for the following:    Sodium 134 (*)    Potassium 2.8 (*)    Chloride 94 (*)    Glucose, Bld 129 (*)    BUN 24 (*)    ALT 12 (*)    All other components within normal limits  CBC WITH DIFFERENTIAL/PLATELET - Abnormal; Notable for the following:    WBC 19.8 (*)    RBC 3.71 (*)    Hemoglobin 11.0 (*)    HCT 32.4  (*)    Neutro Abs 16.7 (*)    Monocytes Absolute 1.4 (*)    All other components within normal limits  PROTIME-INR - Abnormal; Notable for the following:    Prothrombin Time 15.8 (*)    All other components within normal limits  BASIC METABOLIC PANEL - Abnormal; Notable for the following:    Potassium 3.4 (*)    Glucose, Bld 104 (*)    Calcium 8.5 (*)    All other components within normal limits  CBC - Abnormal; Notable for the following:    WBC 26.7 (*)    RBC 3.69 (*)    Hemoglobin 10.2 (*)    HCT 32.1 (*)    All other components within normal limits  HEPARIN LEVEL (UNFRACTIONATED) - Abnormal; Notable for the following:    Heparin Unfractionated 0.18 (*)    All other components within normal limits  HEPARIN LEVEL (UNFRACTIONATED) - Abnormal; Notable for the following:    Heparin Unfractionated 0.19 (*)    All other components within normal limits  CBC - Abnormal; Notable for the following:    WBC 37.1 (*)    RBC 3.64 (*)    Hemoglobin 10.1 (*)    HCT 31.6 (*)    All other components within normal limits  BASIC METABOLIC PANEL - Abnormal; Notable for the following:    Potassium 3.1 (*)    Chloride 99 (*)    Glucose, Bld 106 (*)    BUN 26 (*)    GFR calc non Af Amer 60 (*)    All other components within normal limits  URINALYSIS, ROUTINE W REFLEX MICROSCOPIC (NOT AT [facility]) - Abnormal; Notable for the following:    Hgb urine dipstick SMALL (*)    All other components within normal limits  HEPARIN LEVEL (UNFRACTIONATED) - Abnormal; Notable for the following:    Heparin Unfractionated 0.13 (*)    All other components within normal limits  URINE MICROSCOPIC-ADD ON - Abnormal; Notable for the following:    Squamous Epithelial / LPF 6-30 (*)    Bacteria, UA FEW (*)    All other components within normal limits  HEPARIN LEVEL (UNFRACTIONATED) - Abnormal; Notable for the following:    Heparin Unfractionated 0.29 (*)    All other components within normal limits  BASIC METABOLIC  PANEL - Abnormal; Notable for the following:    Potassium 3.0 (*)    BUN 32 (*)    Calcium 8.6 (*)    All other components within normal limits  CBC - Abnormal; Notable for the following:    WBC 26.8 (*)    RBC 3.51 (*)    Hemoglobin 9.7 (*)    HCT 30.1 (*)    All other components within normal limits  HEPARIN LEVEL (UNFRACTIONATED) - Abnormal; Notable for the following:    Heparin Unfractionated 0.22 (*)  All other components within normal limits  BASIC METABOLIC PANEL - Abnormal; Notable for the following:    Potassium 3.0 (*)    BUN 21 (*)    Calcium 8.3 (*)    All other components within normal limits  MAGNESIUM - Abnormal; Notable for the following:    Magnesium 1.6 (*)    All other components within normal limits  CBC - Abnormal; Notable for the following:    WBC 18.3 (*)    RBC 3.30 (*)    Hemoglobin 9.1 (*)    HCT 28.2 (*)    All other components within normal limits  HEPARIN LEVEL (UNFRACTIONATED) - Abnormal; Notable for the following:    Heparin Unfractionated 0.27 (*)    All other components within normal limits  LIPID PANEL - Abnormal; Notable for the following:    HDL 20 (*)    All other components within normal limits  CBC - Abnormal; Notable for the following:    WBC 12.8 (*)    RBC 3.41 (*)    Hemoglobin 9.3 (*)    HCT 30.0 (*)    All other components within normal limits  BASIC METABOLIC PANEL - Abnormal; Notable for the following:    Glucose, Bld 137 (*)    Calcium 8.3 (*)    All other components within normal limits  MRSA PCR SCREENING  C DIFFICILE QUICK SCREEN W PCR REFLEX  URINALYSIS, ROUTINE W REFLEX MICROSCOPIC (NOT AT [facility])  LIPASE, BLOOD  TROPONIN I  LACTIC ACID, PLASMA  APTT  MAGNESIUM  HEPARIN LEVEL (UNFRACTIONATED)  HEPARIN LEVEL (UNFRACTIONATED)  TSH  HEPARIN LEVEL (UNFRACTIONATED)  HEPARIN LEVEL (UNFRACTIONATED)    Imaging Review No results found. I have personally reviewed and evaluated these images and lab results as  part of my medical decision-making.   EKG Interpretation   Date/Time:  Sunday Jan 11 2016 19:29:17 EDT Ventricular Rate:  128 PR Interval:  107 QRS Duration: 122 QT Interval:  347 QTC Calculation: 506 R Axis:   -78 Text Interpretation:  Atrial fibrillation with rvr RBBB and LAFB LVH with  secondary repolarization abnormality Baseline wander in lead(s) V3 V4  Confirmed by POLLINA  MD, CHRISTOPHER [phone]) on 01/12/2016 3:09:49 PM      MDM   Final diagnoses:  Hypokalemia  New onset atrial fibrillation (HCC)  Leukocytosis  Generalized abdominal pain        Isla Pence, MD 01/16/16 1307

## 2016-01-11 NOTE — ED Provider Notes (Signed)
Patient noted to be in atrial fibrillation with RVR. Rate ranges from 110 to 130. Blood pressure stable. Repeat abdominal exam with no peritoneal signs. CT with evidence of venous portal gas. Question ischemic bowel. Patient also has a right ureteral stone. Elevation in white blood cell counts. Lactic acid is pending. Discussed with Dr. Arnoldo Morale, general surgery. He thinks that the patient is too complicated to be admitted in [place]. He suggests transfer to [facility]. Discussed with Dr. Hulen Skains. Agrees with transfer but asked to have medicine admit and they will consult. States heparin can be started but does not believe that it is emergent at this point. Would not start any longer-term anticoagulants. Discussed with Dr. Arnoldo Morale, hospitalist. Will admit and facilitate transport. Dr. Hal Hope will be accepting physician. Patient given heparin bolus and started on diltiazem drip in the emergency department.  Julianne Rice, MD 01/11/16 [phone]

## 2016-01-11 NOTE — Progress Notes (Addendum)
ANTICOAGULATION CONSULT NOTE - Preliminary  Pharmacy Consult for heparin Indication: atrial fibrillation  No Known Allergies  Patient Measurements: Height: 5\' 4"  (162.6 cm) Weight: 125 lb (56.7 kg) IBW/kg (Calculated) : 59.2 HEPARIN DW (KG): 56.7   Vital Signs: Temp: 98 F (36.7 C) (05/14 2144) Temp Source: Oral (05/14 2144) BP: 122/89 mmHg (05/14 2230) Pulse Rate: 100 (05/14 2230)  Labs:  Recent Labs  01/11/16 1933  HGB 11.0*  HCT 32.4*  PLT 332  CREATININE 0.78  TROPONINI <0.03   Estimated Creatinine Clearance: 65 mL/min (by C-G formula based on Cr of 0.78).  Medical History: Past Medical History  Diagnosis Date  . COPD (chronic obstructive pulmonary disease) ([place])   . Hypertension   . High cholesterol   . S/P AAA repair     Medications:  Scheduled:  . heparin  3,400 Units Intravenous Once   Infusions:  . diltiazem (CARDIZEM) infusion    . heparin    . sodium chloride 500 mL (01/11/16 2327)   PRN:  Anti-infectives    None      Assessment: Pt c/o abdominal pain with N/V. Pt noted to be in atrial fibrillation, starting heparin. Pt is being transferred to [facility].  Goal of Therapy:  Heparin level 0.3-0.7 units/ml   Plan:  Give 3400 units bolus x 1 Start heparin infusion at 850 units/hr Check anti-Xa level in 8 hours and daily while on heparin Continue to monitor H&H and platelets Preliminary review of pertinent patient information completed.  Forestine Na clinical pharmacist will complete review during morning rounds to assess the patient and finalize treatment regimen.  Nyra Capes, RPH 01/11/2016,11:42 PM   Cancelled bolus from infusion and infusion after speaking with [facility].  Physician wishes to only give bolus of heparin 3400 units IV x 1 at [facility] prior to [facility] picking up patient for transfer to [facility].  Thank you.

## 2016-01-12 ENCOUNTER — Encounter (HOSPITAL_COMMUNITY): Payer: Self-pay | Admitting: *Deleted

## 2016-01-12 DIAGNOSIS — K559 Vascular disorder of intestine, unspecified: Secondary | ICD-10-CM | POA: Diagnosis present

## 2016-01-12 DIAGNOSIS — I10 Essential (primary) hypertension: Secondary | ICD-10-CM | POA: Diagnosis present

## 2016-01-12 DIAGNOSIS — Z720 Tobacco use: Secondary | ICD-10-CM

## 2016-01-12 DIAGNOSIS — E7800 Pure hypercholesterolemia, unspecified: Secondary | ICD-10-CM | POA: Diagnosis present

## 2016-01-12 DIAGNOSIS — R1084 Generalized abdominal pain: Secondary | ICD-10-CM | POA: Insufficient documentation

## 2016-01-12 DIAGNOSIS — J449 Chronic obstructive pulmonary disease, unspecified: Secondary | ICD-10-CM | POA: Diagnosis present

## 2016-01-12 DIAGNOSIS — E876 Hypokalemia: Secondary | ICD-10-CM | POA: Diagnosis present

## 2016-01-12 DIAGNOSIS — I4891 Unspecified atrial fibrillation: Secondary | ICD-10-CM | POA: Diagnosis present

## 2016-01-12 LAB — CBC
HCT: 32.1 % — ABNORMAL LOW (ref 39.0–52.0)
Hemoglobin: 10.2 g/dL — ABNORMAL LOW (ref 13.0–17.0)
MCH: 27.6 pg (ref 26.0–34.0)
MCHC: 31.8 g/dL (ref 30.0–36.0)
MCV: 87 fL (ref 78.0–100.0)
PLATELETS: 300 10*3/uL (ref 150–400)
RBC: 3.69 MIL/uL — AB (ref 4.22–5.81)
RDW: 14.4 % (ref 11.5–15.5)
WBC: 26.7 10*3/uL — AB (ref 4.0–10.5)

## 2016-01-12 LAB — BASIC METABOLIC PANEL
Anion gap: 9 (ref 5–15)
BUN: 13 mg/dL (ref 6–20)
CHLORIDE: 102 mmol/L (ref 101–111)
CO2: 26 mmol/L (ref 22–32)
CREATININE: 0.65 mg/dL (ref 0.61–1.24)
Calcium: 8.5 mg/dL — ABNORMAL LOW (ref 8.9–10.3)
Glucose, Bld: 104 mg/dL — ABNORMAL HIGH (ref 65–99)
POTASSIUM: 3.4 mmol/L — AB (ref 3.5–5.1)
SODIUM: 137 mmol/L (ref 135–145)

## 2016-01-12 LAB — MAGNESIUM: MAGNESIUM: 1.7 mg/dL (ref 1.7–2.4)

## 2016-01-12 LAB — HEPARIN LEVEL (UNFRACTIONATED)
HEPARIN UNFRACTIONATED: 0.18 [IU]/mL — AB (ref 0.30–0.70)
HEPARIN UNFRACTIONATED: 0.33 [IU]/mL (ref 0.30–0.70)

## 2016-01-12 LAB — MRSA PCR SCREENING: MRSA by PCR: NEGATIVE

## 2016-01-12 LAB — LACTIC ACID, PLASMA: Lactic Acid, Venous: 1.3 mmol/L (ref 0.5–2.0)

## 2016-01-12 MED ORDER — POTASSIUM CHLORIDE 10 MEQ/100ML IV SOLN: 10.0000 meq | INTRAVENOUS | Status: DC

## 2016-01-12 MED ORDER — POTASSIUM CHLORIDE 10 MEQ/100ML IV SOLN
INTRAVENOUS | Status: AC
  Administered 2016-01-12: 10 meq via INTRAVENOUS
  Filled 2016-01-12: qty 100

## 2016-01-12 MED ORDER — HYDROMORPHONE HCL 1 MG/ML IJ SOLN
0.5000 mg | INTRAMUSCULAR | Status: DC | PRN
  Administered 2016-01-12: 0.5 mg via INTRAVENOUS
  Administered 2016-01-12: 1 mg via INTRAVENOUS
  Administered 2016-01-13 (×3): 0.5 mg via INTRAVENOUS
  Filled 2016-01-12 (×5): qty 1

## 2016-01-12 MED ORDER — ONDANSETRON HCL 4 MG/2ML IJ SOLN: 4.0000 mg | Freq: Four times a day (QID) | INTRAMUSCULAR | Status: DC | PRN

## 2016-01-12 MED ORDER — LABETALOL HCL 200 MG PO TABS
300.0000 mg | ORAL_TABLET | Freq: Two times a day (BID) | ORAL | Status: DC
  Administered 2016-01-13: 300 mg via ORAL
  Filled 2016-01-12: qty 1

## 2016-01-12 MED ORDER — ACETAMINOPHEN 650 MG RE SUPP: 650.0000 mg | Freq: Four times a day (QID) | RECTAL | Status: DC | PRN

## 2016-01-12 MED ORDER — HEPARIN (PORCINE) IN NACL 100-0.45 UNIT/ML-% IJ SOLN
1550.0000 [IU]/h | INTRAMUSCULAR | Status: AC
  Administered 2016-01-12: 850 [IU]/h via INTRAVENOUS
  Administered 2016-01-13: 950 [IU]/h via INTRAVENOUS
  Administered 2016-01-15: 1550 [IU]/h via INTRAVENOUS
  Filled 2016-01-12 (×5): qty 250

## 2016-01-12 MED ORDER — ONDANSETRON HCL 4 MG PO TABS: 4.0000 mg | ORAL_TABLET | Freq: Four times a day (QID) | ORAL | Status: DC | PRN

## 2016-01-12 MED ORDER — OFF THE BEAT BOOK
Freq: Once | Status: AC
  Administered 2016-01-12: 07:00:00
  Filled 2016-01-12: qty 1

## 2016-01-12 MED ORDER — SODIUM CHLORIDE 0.9 % IV SOLN: 250.0000 mL | INTRAVENOUS | Status: DC | PRN

## 2016-01-12 MED ORDER — SODIUM CHLORIDE 0.9% FLUSH: 3.0000 mL | INTRAVENOUS | Status: DC | PRN

## 2016-01-12 MED ORDER — HEPARIN SODIUM (PORCINE) 5000 UNIT/ML IJ SOLN: 1000.0000 [IU] | Freq: Once | INTRAMUSCULAR | Status: DC

## 2016-01-12 MED ORDER — HEPARIN SODIUM (PORCINE) 5000 UNIT/ML IJ SOLN
60.0000 [IU]/kg | Freq: Once | INTRAMUSCULAR | Status: AC
  Administered 2016-01-12: 3400 [IU] via INTRAVENOUS
  Filled 2016-01-12: qty 1

## 2016-01-12 MED ORDER — DEXTROSE 5 % IV SOLN
5.0000 mg/h | INTRAVENOUS | Status: DC
  Administered 2016-01-12 – 2016-01-13 (×5): 12.5 mg/h via INTRAVENOUS
  Filled 2016-01-12 (×5): qty 100

## 2016-01-12 MED ORDER — DILTIAZEM HCL 100 MG IV SOLR
INTRAVENOUS | Status: AC
  Filled 2016-01-12: qty 100

## 2016-01-12 MED ORDER — SODIUM CHLORIDE 0.9% FLUSH
3.0000 mL | Freq: Two times a day (BID) | INTRAVENOUS | Status: DC
  Administered 2016-01-15 – 2016-01-17 (×5): 3 mL via INTRAVENOUS

## 2016-01-12 MED ORDER — METOPROLOL TARTRATE 5 MG/5ML IV SOLN: 2.5000 mg | Freq: Four times a day (QID) | INTRAVENOUS | Status: DC

## 2016-01-12 MED ORDER — HEPARIN BOLUS VIA INFUSION
1000.0000 [IU] | Freq: Once | INTRAVENOUS | Status: AC
  Administered 2016-01-12: 1000 [IU] via INTRAVENOUS
  Filled 2016-01-12: qty 1000

## 2016-01-12 MED ORDER — ACETAMINOPHEN 325 MG PO TABS: 650.0000 mg | ORAL_TABLET | Freq: Four times a day (QID) | ORAL | Status: DC | PRN

## 2016-01-12 MED ORDER — SODIUM CHLORIDE 0.9 % IV BOLUS (SEPSIS)
250.0000 mL | Freq: Once | INTRAVENOUS | Status: AC
  Administered 2016-01-12: 250 mL via INTRAVENOUS

## 2016-01-12 MED ORDER — NICOTINE 14 MG/24HR TD PT24
14.0000 mg | MEDICATED_PATCH | Freq: Every day | TRANSDERMAL | Status: DC
  Filled 2016-01-12 (×3): qty 1

## 2016-01-12 MED ORDER — POTASSIUM CHLORIDE 10 MEQ/100ML IV SOLN
10.0000 meq | Freq: Once | INTRAVENOUS | Status: AC
  Administered 2016-01-12: 10 meq via INTRAVENOUS
  Filled 2016-01-12: qty 100

## 2016-01-12 MED ORDER — PIPERACILLIN-TAZOBACTAM 3.375 G IVPB
3.3750 g | Freq: Three times a day (TID) | INTRAVENOUS | Status: DC
  Administered 2016-01-12 – 2016-01-17 (×16): 3.375 g via INTRAVENOUS
  Filled 2016-01-12 (×18): qty 50

## 2016-01-12 MED ORDER — POTASSIUM CHLORIDE 10 MEQ/100ML IV SOLN
10.0000 meq | INTRAVENOUS | Status: AC
  Administered 2016-01-12 (×4): 10 meq via INTRAVENOUS
  Filled 2016-01-12 (×3): qty 100

## 2016-01-12 NOTE — Consult Note (Signed)
[facility] Surgery Consult Note  Steve Gomez Apr 23, 1941  [id number].    Requesting MD: Dr. Arnoldo Morale Chief Complaint/Reason for Consult: Abdominal pain with N/V  HPI:  75 y/o white male with PMH COPD, HTN, HLD, s/p AAA repair who presented to [name] yesterday due to acute onset of upper abdominal pain with N/V.  The vomiting occurred several hours after lunch and included food, NB/NB.  He hasn't felt anything like this since his AAA repair.  8/10 pain originally.  Of note the patient was pending an appointment with his primary secondary to N/V/D over the last few weeks.  No CP, has some SOB at baseline, some weight loss and anorexia over the last year.  No hematochezia, melena, or hematemesis.  No radicular pain, no precipitating/alleviating factors.  No h/o abdominal problems.  Pain and N/V much improved today.  H/o laparoscopic hernia repair with mesh and cholecystectomy.    CT findings concerning for portal venous gas, Dr. Arnoldo Morale at [facility] deteremined that the patient need a higher level of care thus the patient was transferred here from [facility] for further evaluation/management.  The hospitalist have admitted the patient and we have been asked to determine if the patient has a surgical problem.  The patient is in AFIB with RVR on cardizem/heparin drip.  ROS: All systems reviewed and otherwise negative except for as above  History reviewed. No pertinent family history.  Past Medical History  Diagnosis Date  . COPD (chronic obstructive pulmonary disease) ([place])   . Hypertension   . High cholesterol   . S/P AAA repair     History reviewed. No pertinent past surgical history.  Social History:  reports that he has been smoking Cigarettes.  He has been smoking about 1.00 pack per day. He does not have any smokeless tobacco history on file. He reports that he drinks alcohol. He reports that he does not use illicit drugs.  Allergies: No Known Allergies  Medications Prior to Admission   Medication Sig Dispense Refill  . acetaminophen (TYLENOL) 500 MG tablet Take 500 mg by mouth every 6 (six) hours as needed for mild pain or moderate pain.    Marland Kitchen aspirin EC 81 MG tablet Take 81 mg by mouth daily.    . chlorthalidone (HYGROTON) 25 MG tablet Take 1 tablet by mouth daily.    Marland Kitchen labetalol (NORMODYNE) 300 MG tablet Take 1 tablet by mouth 2 (two) times daily.    . Multiple Vitamins-Minerals (MULTIVITAMINS THER. W/MINERALS) TABS tablet Take 1 tablet by mouth daily.    . OXYGEN Inhale 2 L into the lungs at bedtime.    . simvastatin (ZOCOR) 40 MG tablet Take 1 tablet by mouth daily.      Blood pressure 106/56, pulse 45, temperature 98.6 F (37 C), temperature source Oral, resp. rate 20, height 5' 4"  (1.626 m), weight 117 lb 3.2 oz (53.162 kg), SpO2 97 %. Physical Exam: General: pleasant, WD/WN white male who is laying in bed in NAD HEENT: head is normocephalic, atraumatic.  Sclera are noninjected.  PERRL.  Ears and nose without any masses or lesions.  Mouth is pink and moist Heart: tachycardic with irregular rhythm.  No obvious murmurs, gallops, or rubs noted.  Palpable pedal pulses bilaterally Lungs: CTAB, no wheezes, rhonchi, or rales noted.  Respiratory effort nonlabored, [place] O2 on. Abd: soft, mildly distended/not tender, +BS, no masses, hernias, or organomegaly, midline surgical scar noted which is well healed, laparoscopic scars are not viable on my exam MS: all 4  extremities are symmetrical with no cyanosis, clubbing, or edema, B/L LE nearly hairless Skin: warm and dry with no masses, lesions, or rashes Psych: A&Ox3 with an appropriate affect.   Results for orders placed or performed during the hospital encounter of 01/11/16 (from the past 48 hour(s))  Comprehensive metabolic panel     Status: Abnormal   Collection Time: 01/11/16  7:33 PM  Result Value Ref Range   Sodium 134 (L) 135 - 145 mmol/L   Potassium 2.8 (L) 3.5 - 5.1 mmol/L   Chloride 94 (L) 101 - 111 mmol/L   CO2 31  22 - 32 mmol/L   Glucose, Bld 129 (H) 65 - 99 mg/dL   BUN 24 (H) 6 - 20 mg/dL   Creatinine, Ser 0.78 0.61 - 1.24 mg/dL   Calcium 9.2 8.9 - 10.3 mg/dL   Total Protein 6.9 6.5 - 8.1 g/dL   Albumin 3.5 3.5 - 5.0 g/dL   AST 20 15 - 41 U/L   ALT 12 (L) 17 - 63 U/L   Alkaline Phosphatase 124 38 - 126 U/L   Total Bilirubin 0.5 0.3 - 1.2 mg/dL   GFR calc non Af Amer >60 >60 mL/min   GFR calc Af Amer >60 >60 mL/min    Comment: (NOTE) The eGFR has been calculated using the CKD EPI equation. This calculation has not been validated in all clinical situations. eGFR's persistently <60 mL/min signify possible Chronic Kidney Disease.    Anion gap 9 5 - 15  CBC with Differential     Status: Abnormal   Collection Time: 01/11/16  7:33 PM  Result Value Ref Range   WBC 19.8 (H) 4.0 - 10.5 K/uL   RBC 3.71 (L) 4.22 - 5.81 MIL/uL   Hemoglobin 11.0 (L) 13.0 - 17.0 g/dL   HCT 32.4 (L) 39.0 - 52.0 %   MCV 87.3 78.0 - 100.0 fL   MCH 29.6 26.0 - 34.0 pg   MCHC 34.0 30.0 - 36.0 g/dL   RDW 14.3 11.5 - 15.5 %   Platelets 332 150 - 400 K/uL   Neutrophils Relative % 84 %   Neutro Abs 16.7 (H) 1.7 - 7.7 K/uL   Lymphocytes Relative 8 %   Lymphs Abs 1.6 0.7 - 4.0 K/uL   Monocytes Relative 7 %   Monocytes Absolute 1.4 (H) 0.1 - 1.0 K/uL   Eosinophils Relative 1 %   Eosinophils Absolute 0.1 0.0 - 0.7 K/uL   Basophils Relative 0 %   Basophils Absolute 0.0 0.0 - 0.1 K/uL  Lipase, blood     Status: None   Collection Time: 01/11/16  7:33 PM  Result Value Ref Range   Lipase 31 11 - 51 U/L  Troponin I     Status: None   Collection Time: 01/11/16  7:33 PM  Result Value Ref Range   Troponin I <0.03 <0.031 ng/mL    Comment:        NO INDICATION OF MYOCARDIAL INJURY.   APTT     Status: None   Collection Time: 01/11/16  7:33 PM  Result Value Ref Range   aPTT 32 24 - 37 seconds  Protime-INR     Status: Abnormal   Collection Time: 01/11/16  7:33 PM  Result Value Ref Range   Prothrombin Time 15.8 (H) 11.6 -  15.2 seconds   INR 1.24 0.00 - 1.49  Magnesium     Status: None   Collection Time: 01/11/16  7:33 PM  Result Value Ref Range  Magnesium 1.7 1.7 - 2.4 mg/dL  Urinalysis, Routine w reflex microscopic     Status: None   Collection Time: 01/11/16  8:00 PM  Result Value Ref Range   Color, Urine YELLOW YELLOW   APPearance CLEAR CLEAR   Specific Gravity, Urine 1.015 1.005 - 1.030   pH 6.5 5.0 - 8.0   Glucose, UA NEGATIVE NEGATIVE mg/dL   Hgb urine dipstick NEGATIVE NEGATIVE   Bilirubin Urine NEGATIVE NEGATIVE   Ketones, ur NEGATIVE NEGATIVE mg/dL   Protein, ur NEGATIVE NEGATIVE mg/dL   Nitrite NEGATIVE NEGATIVE   Leukocytes, UA NEGATIVE NEGATIVE    Comment: MICROSCOPIC NOT DONE ON URINES WITH NEGATIVE PROTEIN, BLOOD, LEUKOCYTES, NITRITE, OR GLUCOSE <1000 mg/dL.  Lactic acid, plasma     Status: None   Collection Time: 01/11/16 11:10 PM  Result Value Ref Range   Lactic Acid, Venous 1.3 0.5 - 2.0 mmol/L  MRSA PCR Screening     Status: None   Collection Time: 01/12/16  2:32 AM  Result Value Ref Range   MRSA by PCR NEGATIVE NEGATIVE    Comment:        The GeneXpert MRSA Assay (FDA approved for NASAL specimens only), is one component of a comprehensive MRSA colonization surveillance program. It is not intended to diagnose MRSA infection nor to guide or monitor treatment for MRSA infections.   Basic metabolic panel     Status: Abnormal   Collection Time: 01/12/16  6:14 AM  Result Value Ref Range   Sodium 137 135 - 145 mmol/L   Potassium 3.4 (L) 3.5 - 5.1 mmol/L   Chloride 102 101 - 111 mmol/L   CO2 26 22 - 32 mmol/L   Glucose, Bld 104 (H) 65 - 99 mg/dL   BUN 13 6 - 20 mg/dL   Creatinine, Ser 0.65 0.61 - 1.24 mg/dL   Calcium 8.5 (L) 8.9 - 10.3 mg/dL   GFR calc non Af Amer >60 >60 mL/min   GFR calc Af Amer >60 >60 mL/min    Comment: (NOTE) The eGFR has been calculated using the CKD EPI equation. This calculation has not been validated in all clinical situations. eGFR's  persistently <60 mL/min signify possible Chronic Kidney Disease.    Anion gap 9 5 - 15  CBC     Status: Abnormal   Collection Time: 01/12/16  6:14 AM  Result Value Ref Range   WBC 26.7 (H) 4.0 - 10.5 K/uL   RBC 3.69 (L) 4.22 - 5.81 MIL/uL   Hemoglobin 10.2 (L) 13.0 - 17.0 g/dL   HCT 32.1 (L) 39.0 - 52.0 %   MCV 87.0 78.0 - 100.0 fL   MCH 27.6 26.0 - 34.0 pg   MCHC 31.8 30.0 - 36.0 g/dL   RDW 14.4 11.5 - 15.5 %   Platelets 300 150 - 400 K/uL   Ct Abdomen Pelvis W Contrast  01/11/2016  ADDENDUM REPORT: 01/11/2016 22:37 ADDENDUM: These results were called by telephone at the time of interpretation on <CurrentDate> at <CurrentTime> to Dr. Shanon Brow , who verbally acknowledged these results. Electronically Signed   By: Anner Crete M.D.   On: 01/11/2016 22:37  01/11/2016  CLINICAL DATA:  75 year old male with bilateral lower quadrant abdominal pain. EXAM: CT ABDOMEN AND PELVIS WITH CONTRAST TECHNIQUE: Multidetector CT imaging of the abdomen and pelvis was performed using the standard protocol following bolus administration of intravenous contrast. CONTRAST:  166m ISOVUE-300 IOPAMIDOL (ISOVUE-300) INJECTION 61% COMPARISON:  None. FINDINGS: The visualized lung bases are clear. There is  coronary vascular calcification. No intra-abdominal free air or free fluid. Cholecystectomy. Linear and branching air noted within the peripheral left lobe of the liver concerning for of the portal venous gas. The liver is otherwise unremarkable. The pancreas, spleen, and the right adrenal gland appear unremarkable. There is mild thickening of the left adrenal gland. Multiple small nonobstructing bilateral renal calculi measuring up to 4 mm. There is mild fullness of the renal collecting systems bilaterally. There is a 5 mm calculus in the proximal right ureter. There is a 2.6 cm exophytic hypodense lesion arising from the posterior cortex of the right kidney most compatible with a cyst. The urinary bladder is distended.  There is trabecular appearance of the bladder wall compatible with chronic bladder outlet obstruction. The prostate gland is mildly enlarged and measures 4.5 cm in transverse diameter. There is apparent thickening of the distal stomach in the region of the antrum and pylorus which may be related to underdistention or represent gastritis. Clinical correlation is recommended. Multiple small duodenal diverticula without definite active inflammation. There is no evidence of bowel obstruction. The visualized appendix appears unremarkable. There is aortoiliac atherosclerotic disease. There is focal area of advanced atherosclerotic plaque with narrowing of the aorta just inferior to the origins of the renal arteries. The aorta is tortuous. There is atherosclerotic calcifications of the origins of the celiac axis and SMA. The origins the celiac axis, SMA appear patent. The SMV, and main portal vein are patent. No portal venous gas identified. There is no adenopathy. Set the abdominal wall soft tissues appear unremarkable. There is osteopenia with degenerative changes of the spine. No acute fracture. IMPRESSION: Linear branching air in the left lobe of the liver concerning for portal venous gas. This is of indeterminate etiology, however the possibility of ischemic bowel is not excluded. Clinical correlation is recommended. Underdistention of the stomach versus gastritis. No evidence of bowel obstruction. No CT evidence for acute appendicitis. A 5 mm proximal right ureteral calculus with small nonobstructing bilateral renal calculi. Mild fullness of the renal collecting systems bilaterally. Trabecular appearance of the urinary bladder compatible with chronic bladder outlet obstruction. Electronically Signed: By: Anner Crete M.D. On: 01/11/2016 22:26      Assessment/Plan Abdominal pain - CT findings concerning for ?Portal venous gas, ?Antrum/pylorus gastritis Nausea/vomiting Duodenal diverticula without  inflammation Celiac and SMA athersclerosis - non obstructive Leukocytosis with normal lactic acid - 26.7 AFIB with RVR Hypokalemia - supplemented  HTN/HLD H/o hernia repair with mesh, cholecystectomy, and AAA repair  Plan: 1.  The patients pain has much improved since admission.  He does not have an acute abdomen.  His vitals are stable.  Would recommend conservative management for now with serial exams. 2.  NPO, bowel rest, IVF, pain control, antiemetics 3.  SCD's and on heparin drip 4.  Ambulate and IS  5.  Will follow for now   Nat Christen, [facility] Surgery 01/12/2016, 8:32 AM Pager: [phone] (7am - 4:30pm M-F; 7am - 11:30am Sa/Su)

## 2016-01-12 NOTE — Progress Notes (Signed)
[facility] for heparin Indication: atrial fibrillation  No Known Allergies  Patient Measurements: Height: 5\' 4"  (162.6 cm) Weight: 117 lb 3.2 oz (53.162 kg) IBW/kg (Calculated) : 59.2 HEPARIN DW (KG): 53.2   Vital Signs: Temp: 98.2 F (36.8 C) (05/15 0227) Temp Source: Oral (05/15 0227) BP: 122/78 mmHg (05/15 0030) Pulse Rate: 100 (05/15 0030)  Labs:  Recent Labs  01/11/16 1933  HGB 11.0*  HCT 32.4*  PLT 332  APTT 32  LABPROT 15.8*  INR 1.24  CREATININE 0.78  TROPONINI <0.03   Estimated Creatinine Clearance: 61 mL/min (by C-G formula based on Cr of 0.78).  Assessment: 75 y.o. male with new onset Afib for heparin.  Heparin 3400 units IV bolus given at [facility] at 0030  Goal of Therapy:  Heparin level 0.3-0.7 units/ml   Plan:  Start heparin 850 units/hr Check heparin level in 8 hours.  Phillis Knack, PharmD, BCPS

## 2016-01-12 NOTE — Progress Notes (Signed)
Patient seen and evaluated earlier this a.m. but my associate. Please refer to H&P for details regarding assessment and plan  In elevation and WBC will place on Zosyn for now  General surgery has been consulted secondary to CT of abdomen findings.  Gen.: Patient in no acute distress, alert and awake Pulmonary: No increased work of breathing, equal chest rise, no wheezes Abdomen: No guarding, nondistended  Will reassess next am or sooner should patient's medical condition change  Eveny Anastas, Linward Foster

## 2016-01-12 NOTE — H&P (Addendum)
[facility] Admission History and Physical       Steve Gomez [id number] DOB: Feb 10, 1941 DOA: 01/11/2016  Referring physician: EDP PCP: Alonza Bogus, MD  Specialists:   Chief Complaint: ABD Pain  HPI: Steve Gomez is a 75 y.o. male with a history of COPD, HTN, Hyperlipidemia who presents to the ED with complaints of 8/10 RUQ ABD Pain for 2 weeks worsening over the past 24 hours with Nauses and vomiting today.   He was evaluated in the ED and found to be in Atrial fibrillation with RVR rate in the 130's, and a CT scan of the ABD Pelvis revealed Findings of Gas in the Left Lobe of Liver concerning for Portal Venous gas.    CCS surgery Dr Hulen Skains was consulted and arrangements were made to transfer to [facility] to an SDU Bed.      Review of Systems:  Constitutional: No Weight Loss, No Weight Gain, Night Sweats, Fevers, Chills, Dizziness, Light Headedness, Fatigue, or Generalized Weakness HEENT: No Headaches, Difficulty Swallowing,Tooth/Dental Problems,Sore Throat,  No Sneezing, Rhinitis, Ear Ache, Nasal Congestion, or Post Nasal Drip,  Cardio-vascular:  No Chest pain, Orthopnea, PND, Edema in Lower Extremities, Anasarca, Dizziness, Palpitations  Resp: No Dyspnea, No DOE, No Productive Cough, No Non-Productive Cough, No Hemoptysis, No Wheezing.    GI: No Heartburn, Indigestion, +Abdominal Pain, +Nausea, +Vomiting, Diarrhea, Constipation, Hematemesis, Hematochezia, Melena, Change in Bowel Habits,  Loss of Appetite  GU: No Dysuria, No Change in Color of Urine, No Urgency or Urinary Frequency, No Flank pain.  Musculoskeletal: No Joint Pain or Swelling, No Decreased Range of Motion, No Back Pain.  Neurologic: No Syncope, No Seizures, Muscle Weakness, Paresthesia, Vision Disturbance or Loss, No Diplopia, No Vertigo, No Difficulty Walking,  Skin: No Rash or Lesions. Psych: No Change in Mood or Affect, No Depression or Anxiety, No Memory loss, No Confusion, or  Hallucinations   Past Medical History  Diagnosis Date  . COPD (chronic obstructive pulmonary disease) ([place])   . Hypertension   . High cholesterol   . S/P AAA repair      History reviewed. No pertinent past surgical history.    Prior to Admission medications   Medication Sig Start Date End Date Taking? Authorizing Provider  acetaminophen (TYLENOL) 500 MG tablet Take 500 mg by mouth every 6 (six) hours as needed for mild pain or moderate pain.   Yes Historical Provider, MD  aspirin EC 81 MG tablet Take 81 mg by mouth daily.   Yes Historical Provider, MD  chlorthalidone (HYGROTON) 25 MG tablet Take 1 tablet by mouth daily. 12/01/14  Yes Historical Provider, MD  labetalol (NORMODYNE) 300 MG tablet Take 1 tablet by mouth 2 (two) times daily. 12/01/14  Yes Historical Provider, MD  Multiple Vitamins-Minerals (MULTIVITAMINS THER. W/MINERALS) TABS tablet Take 1 tablet by mouth daily. 11/30/14  Yes Historical Provider, MD  OXYGEN Inhale 2 L into the lungs at bedtime.   Yes Historical Provider, MD  simvastatin (ZOCOR) 40 MG tablet Take 1 tablet by mouth daily. 12/01/14  Yes Historical Provider, MD     No Known Allergies  Social History:  reports that he has been smoking Cigarettes.  He has been smoking about 1.00 pack per day. He does not have any smokeless tobacco history on file. He reports that he drinks alcohol. He reports that he does not use illicit drugs.    History reviewed. No pertinent family history.     Physical Exam:  GEN:  Pleasant Well Nourished and Well  Developed 75 y.o. Caucasian male examined and in no acute distress; cooperative with exam Filed Vitals:   01/11/16 2100 01/11/16 2130 01/11/16 2144 01/11/16 2230  BP: 129/88 125/77 125/77 122/89  Pulse:  95 88 100  Temp:   98 F (36.7 C)   TempSrc:   Oral   Resp: 23 23 19 22   Height:      Weight:      SpO2:  97% 98% 96%   Blood pressure 122/89, pulse 100, temperature 98 F (36.7 C), temperature source Oral, resp. rate  22, height 5\' 4"  (1.626 m), weight 56.7 kg (125 lb), SpO2 96 %. PSYCH: He is alert and oriented x4; does not appear anxious does not appear depressed; affect is normal HEENT: Normocephalic and Atraumatic, Mucous membranes pink; PERRLA; EOM intact; Fundi:  Benign;  No scleral icterus, Nares: Patent, Oropharynx: Clear, Edentulous,    Neck:  FROM, No Cervical Lymphadenopathy nor Thyromegaly or Carotid Bruit; No JVD; Breasts:: Not examined CHEST WALL: No tenderness CHEST: Normal respiration, clear to auscultation bilaterally HEART: Regular rate and rhythm; no murmurs rubs or gallops BACK: No kyphosis or scoliosis; No CVA tenderness ABDOMEN: Positive Bowel Sounds, Soft +tenderness in RUQ, No Rebound NO Guarding, No Hepatosplenomegaly Rectal Exam: Not done EXTREMITIES: No cyanosis , Clubbing or Edema Genitalia: not examined PULSES: 2+ and symmetric SKIN: Normal hydration no rash or ulceration CNS:  Alert and Oriented x 4, No Focal Deficits Vascular: pulses palpable throughout    Labs on Admission:  Basic Metabolic Panel:  Recent Labs Lab 01/11/16 1933  NA 134*  K 2.8*  CL 94*  CO2 31  GLUCOSE 129*  BUN 24*  CREATININE 0.78  CALCIUM 9.2   Liver Function Tests:  Recent Labs Lab 01/11/16 1933  AST 20  ALT 12*  ALKPHOS 124  BILITOT 0.5  PROT 6.9  ALBUMIN 3.5    Recent Labs Lab 01/11/16 1933  LIPASE 31   No results for input(s): AMMONIA in the last 168 hours. CBC:  Recent Labs Lab 01/11/16 1933  WBC 19.8*  NEUTROABS 16.7*  HGB 11.0*  HCT 32.4*  MCV 87.3  PLT 332   Cardiac Enzymes:  Recent Labs Lab 01/11/16 1933  TROPONINI <0.03    BNP (last 3 results) No results for input(s): BNP in the last 8760 hours.  ProBNP (last 3 results) No results for input(s): PROBNP in the last 8760 hours.  CBG: No results for input(s): GLUCAP in the last 168 hours.  Radiological Exams on Admission: Ct Abdomen Pelvis W Contrast  01/11/2016  ADDENDUM REPORT: 01/11/2016  22:37 ADDENDUM: These results were called by telephone at the time of interpretation on <CurrentDate> at <CurrentTime> to Dr. Shanon Brow , who verbally acknowledged these results. Electronically Signed   By: Anner Crete M.D.   On: 01/11/2016 22:37  01/11/2016  CLINICAL DATA:  75 year old male with bilateral lower quadrant abdominal pain. EXAM: CT ABDOMEN AND PELVIS WITH CONTRAST TECHNIQUE: Multidetector CT imaging of the abdomen and pelvis was performed using the standard protocol following bolus administration of intravenous contrast. CONTRAST:  179mL ISOVUE-300 IOPAMIDOL (ISOVUE-300) INJECTION 61% COMPARISON:  None. FINDINGS: The visualized lung bases are clear. There is coronary vascular calcification. No intra-abdominal free air or free fluid. Cholecystectomy. Linear and branching air noted within the peripheral left lobe of the liver concerning for of the portal venous gas. The liver is otherwise unremarkable. The pancreas, spleen, and the right adrenal gland appear unremarkable. There is mild thickening of the left adrenal gland. Multiple small  nonobstructing bilateral renal calculi measuring up to 4 mm. There is mild fullness of the renal collecting systems bilaterally. There is a 5 mm calculus in the proximal right ureter. There is a 2.6 cm exophytic hypodense lesion arising from the posterior cortex of the right kidney most compatible with a cyst. The urinary bladder is distended. There is trabecular appearance of the bladder wall compatible with chronic bladder outlet obstruction. The prostate gland is mildly enlarged and measures 4.5 cm in transverse diameter. There is apparent thickening of the distal stomach in the region of the antrum and pylorus which may be related to underdistention or represent gastritis. Clinical correlation is recommended. Multiple small duodenal diverticula without definite active inflammation. There is no evidence of bowel obstruction. The visualized appendix appears  unremarkable. There is aortoiliac atherosclerotic disease. There is focal area of advanced atherosclerotic plaque with narrowing of the aorta just inferior to the origins of the renal arteries. The aorta is tortuous. There is atherosclerotic calcifications of the origins of the celiac axis and SMA. The origins the celiac axis, SMA appear patent. The SMV, and main portal vein are patent. No portal venous gas identified. There is no adenopathy. Set the abdominal wall soft tissues appear unremarkable. There is osteopenia with degenerative changes of the spine. No acute fracture. IMPRESSION: Linear branching air in the left lobe of the liver concerning for portal venous gas. This is of indeterminate etiology, however the possibility of ischemic bowel is not excluded. Clinical correlation is recommended. Underdistention of the stomach versus gastritis. No evidence of bowel obstruction. No CT evidence for acute appendicitis. A 5 mm proximal right ureteral calculus with small nonobstructing bilateral renal calculi. Mild fullness of the renal collecting systems bilaterally. Trabecular appearance of the urinary bladder compatible with chronic bladder outlet obstruction. Electronically Signed: By: Anner Crete M.D. On: 01/11/2016 22:26     EKG: Independently reviewed. Atrial Fibrillation rate = 128, +RBBB, +LAFB    Assessment/Plan:     75 y.o. male with  Active Problems:    Atrial fibrillation with RVR ([place])- with ChadS2Vasc Score of 2.     IV Cardizem drip   Cardiac Monitoring   IV Heparin drip        Ischemia of small intestine ([place])- rule out, ? Etiology of Portal Venous Gas of Left Liver Lobe   CCS consulted, Dr Hulen Skains to see      Abdominal pain- due to Ischemia of SMA    CCS Surgery Consulted   Pain Control with IV Dilaudid PRN   NPO exept for Sips with Meds      Hypokalemia   Replace K+   Check Magnesium , Replace PRN      COPD (chronic obstructive pulmonary disease) ([facility])   DuoNebs  PRN      Hypertension   On Normadyne Rx      High cholesterol   On Simvastatin Rx    Tobacco Abuse   Nicotine Patch daily    DVT Prophylaxis   IV Heparin Bolus x 1  Given prior to Transport   Heparin per Pharmacy on arrival to [employer] SDU    Code Status:     FULL CODE  Family Communication:   Wife at Bedside  Disposition Plan:    Inpatient  Status        Time spent: [facility] Pager [phone]   If 7AM -7PM Please Contact the Day Rounding Team MD for [facility]  If 7PM-7AM, Please Contact Night-Floor Coverage  [email or web address] Password [email or web address] 01/12/2016, 12:07 AM     ADDENDUM:   Patient was seen and examined on 01/12/2016

## 2016-01-12 NOTE — Progress Notes (Signed)
ANTICOAGULATION CONSULT NOTE - Follow Up Consult  Pharmacy Consult for heparin Indication: atrial fibrillation  No Known Allergies  Patient Measurements: Height: 5\' 4"  (162.6 cm) Weight: 117 lb 3.2 oz (53.162 kg) IBW/kg (Calculated) : 59.2 Heparin Dosing Weight: 53 kg  Vital Signs: Temp: 98.9 F (37.2 C) (05/15 1251) Temp Source: Oral (05/15 0800) BP: 120/67 mmHg (05/15 1200) Pulse Rate: 62 (05/15 1200)  Labs:  Recent Labs  01/11/16 1933 01/12/16 0614 01/12/16 1101  HGB 11.0* 10.2*  --   HCT 32.4* 32.1*  --   PLT 332 300  --   APTT 32  --   --   LABPROT 15.8*  --   --   INR 1.24  --   --   HEPARINUNFRC  --   --  0.18*  CREATININE 0.78 0.65  --   TROPONINI <0.03  --   --     Estimated Creatinine Clearance: 61 mL/min (by C-G formula based on Cr of 0.65).  Assessment: 75 yo male transferred from [facility] for abd pain x 2 weeks  PMH: COPD, HTN, HLD  Anticoagulation: new onset afib? On iv hep gtt. On 850 units/hr, initial HL low at 0.18  Nephrology: SCr 0.65  Heme: H&H 10.2/32.1, Plt 300  Goal of Therapy:  Heparin level 0.3-0.7 units/ml Monitor platelets by anticoagulation protocol: Yes   Plan:  Bolus 1000 units/hr Increase drip to Heparin 950 units/hr 2200 HL Daily HL, CBC Monitor s/sx of bleeding F/U plans for [facility]  Levester Fresh, PharmD, BCPS, [facility] Clinical Pharmacist Pager [phone] 01/12/2016 1:29 PM

## 2016-01-12 NOTE — Care Management Important Message (Signed)
Important Message  Patient Details  Name: Steve Gomez MRN: [id number] Date of Birth: 1941-01-26   Medicare Important Message Given:  Yes    Sharin Mons, RN 01/12/2016, 2:17 PM

## 2016-01-12 NOTE — Progress Notes (Signed)
Pharmacy Antibiotic Note  Steve Gomez is a 75 y.o. male admitted on 01/11/2016 with possible abd infection.  Pharmacy has been consulted for zosyn dosing.  Plan: Zosyn 3.375g IV q8h (4 hour infusion).  Height: 5\' 4"  (162.6 cm) Weight: 117 lb 3.2 oz (53.162 kg) IBW/kg (Calculated) : 59.2  Temp (24hrs), Avg:98.4 F (36.9 C), Min:98 F (36.7 C), Max:98.9 F (37.2 C)   Recent Labs Lab 01/11/16 1933 01/11/16 2310 01/12/16 0614  WBC 19.8*  --  26.7*  CREATININE 0.78  --  0.65  LATICACIDVEN  --  1.3  --     Estimated Creatinine Clearance: 61 mL/min (by C-G formula based on Cr of 0.65).    No Known Allergies  Antimicrobials this admission: Zosyn 5/15>>  Dose adjustments this admission: None  Microbiology results: MRSA PCR neg  Levester Fresh, PharmD, BCPS, [facility] Clinical Pharmacist Pager [phone] 01/12/2016 8:59 AM

## 2016-01-12 NOTE — Progress Notes (Signed)
Pt heart rate 105-120 while in bed. With any activity to stand up/urinate, heart rate increases to 150s nonsustained.  IV diltiazem currently at 12.5 mg/ hr with SBP 102-104.  Will continue to monitor closely.

## 2016-01-12 NOTE — Progress Notes (Signed)
Pt arrived from [facility] via [facility] in no acute distress.  Pt without c/o.  VSS, afib.  Wife at bedside.  [facility] notified of pt arrival.

## 2016-01-12 NOTE — Progress Notes (Signed)
ANTICOAGULATION CONSULT NOTE - Follow Up Consult  Pharmacy Consult for heparin Indication: atrial fibrillation  No Known Allergies  Patient Measurements: Height: 5\' 4"  (162.6 cm) Weight: 117 lb 3.2 oz (53.162 kg) IBW/kg (Calculated) : 59.2 Heparin Dosing Weight: 53 kg  Vital Signs: Temp: 100 F (37.8 C) (05/15 2012) Temp Source: Oral (05/15 2012) BP: 114/81 mmHg (05/15 2034) Pulse Rate: 78 (05/15 2034)  Labs:  Recent Labs  01/11/16 1933 01/12/16 0614 01/12/16 1101 01/12/16 2130  HGB 11.0* 10.2*  --   --   HCT 32.4* 32.1*  --   --   PLT 332 300  --   --   APTT 32  --   --   --   LABPROT 15.8*  --   --   --   INR 1.24  --   --   --   HEPARINUNFRC  --   --  0.18* 0.33  CREATININE 0.78 0.65  --   --   TROPONINI <0.03  --   --   --     Estimated Creatinine Clearance: 61 mL/min (by C-G formula based on Cr of 0.65).  Assessment: 75 yo male transferred from [facility] for abd pain x 2 weeks, He is noted with afib on heparin at 950 units/hr and heparin level is at goal (HL= 0.33)  PMH: COPD, HTN, HLD  Goal of Therapy:  Heparin level 0.3-0.7 units/ml Monitor platelets by anticoagulation protocol: Yes   Plan:   -No heparin changes needed -Daily heparin level and CBC  Hildred Laser, Pharm D 01/12/2016 10:31 PM

## 2016-01-13 ENCOUNTER — Inpatient Hospital Stay (HOSPITAL_COMMUNITY): Payer: Commercial Managed Care - HMO

## 2016-01-13 DIAGNOSIS — R109 Unspecified abdominal pain: Secondary | ICD-10-CM

## 2016-01-13 LAB — URINE MICROSCOPIC-ADD ON

## 2016-01-13 LAB — URINALYSIS, ROUTINE W REFLEX MICROSCOPIC
BILIRUBIN URINE: NEGATIVE
Glucose, UA: NEGATIVE mg/dL
KETONES UR: NEGATIVE mg/dL
LEUKOCYTES UA: NEGATIVE
NITRITE: NEGATIVE
PROTEIN: NEGATIVE mg/dL
Specific Gravity, Urine: 1.019 (ref 1.005–1.030)
pH: 6 (ref 5.0–8.0)

## 2016-01-13 LAB — CBC
HEMATOCRIT: 31.6 % — AB (ref 39.0–52.0)
Hemoglobin: 10.1 g/dL — ABNORMAL LOW (ref 13.0–17.0)
MCH: 27.7 pg (ref 26.0–34.0)
MCHC: 32 g/dL (ref 30.0–36.0)
MCV: 86.8 fL (ref 78.0–100.0)
PLATELETS: 274 10*3/uL (ref 150–400)
RBC: 3.64 MIL/uL — ABNORMAL LOW (ref 4.22–5.81)
RDW: 14.6 % (ref 11.5–15.5)
WBC: 37.1 10*3/uL — AB (ref 4.0–10.5)

## 2016-01-13 LAB — HEPARIN LEVEL (UNFRACTIONATED)
HEPARIN UNFRACTIONATED: 0.19 [IU]/mL — AB (ref 0.30–0.70)
Heparin Unfractionated: 0.13 IU/mL — ABNORMAL LOW (ref 0.30–0.70)

## 2016-01-13 LAB — BASIC METABOLIC PANEL
ANION GAP: 14 (ref 5–15)
BUN: 26 mg/dL — ABNORMAL HIGH (ref 6–20)
CALCIUM: 9 mg/dL (ref 8.9–10.3)
CO2: 26 mmol/L (ref 22–32)
CREATININE: 1.17 mg/dL (ref 0.61–1.24)
Chloride: 99 mmol/L — ABNORMAL LOW (ref 101–111)
GFR, EST NON AFRICAN AMERICAN: 60 mL/min — AB (ref >60)
Glucose, Bld: 106 mg/dL — ABNORMAL HIGH (ref 65–99)
Potassium: 3.1 mmol/L — ABNORMAL LOW (ref 3.5–5.1)
SODIUM: 139 mmol/L (ref 135–145)

## 2016-01-13 MED ORDER — LIDOCAINE HCL 2 % EX GEL
1.0000 "application " | Freq: Once | CUTANEOUS | Status: AC
  Administered 2016-01-13: 1 via URETHRAL
  Filled 2016-01-13: qty 5

## 2016-01-13 MED ORDER — LABETALOL HCL 100 MG PO TABS
100.0000 mg | ORAL_TABLET | Freq: Two times a day (BID) | ORAL | Status: DC
  Administered 2016-01-14: 100 mg via ORAL
  Filled 2016-01-13: qty 1

## 2016-01-13 MED ORDER — HEPARIN BOLUS VIA INFUSION
1500.0000 [IU] | Freq: Once | INTRAVENOUS | Status: AC
  Administered 2016-01-13: 1500 [IU] via INTRAVENOUS
  Filled 2016-01-13: qty 1500

## 2016-01-13 MED ORDER — SODIUM CHLORIDE 0.9 % IV BOLUS (SEPSIS)
250.0000 mL | Freq: Once | INTRAVENOUS | Status: AC
  Administered 2016-01-13: 250 mL via INTRAVENOUS

## 2016-01-13 MED ORDER — LEVALBUTEROL HCL 1.25 MG/0.5ML IN NEBU
1.2500 mg | INHALATION_SOLUTION | Freq: Four times a day (QID) | RESPIRATORY_TRACT | Status: DC | PRN
  Administered 2016-01-13: 1.25 mg via RESPIRATORY_TRACT
  Filled 2016-01-13: qty 0.5

## 2016-01-13 MED ORDER — SODIUM CHLORIDE 0.9 % IV BOLUS (SEPSIS)
250.0000 mL | Freq: Once | INTRAVENOUS | Status: AC
Start: 2016-01-13 — End: 2016-01-13
  Administered 2016-01-13: 250 mL via INTRAVENOUS

## 2016-01-13 MED ORDER — OFF THE BEAT BOOK
Freq: Once | Status: AC
  Administered 2016-01-13: 14:00:00
  Filled 2016-01-13: qty 1

## 2016-01-13 MED ORDER — POTASSIUM CHLORIDE 10 MEQ/100ML IV SOLN
10.0000 meq | INTRAVENOUS | Status: AC
  Administered 2016-01-13 (×4): 10 meq via INTRAVENOUS
  Filled 2016-01-13 (×4): qty 100

## 2016-01-13 NOTE — Progress Notes (Signed)
ANTICOAGULATION CONSULT NOTE  Pharmacy Consult for heparin Indication: atrial fibrillation  No Known Allergies  Patient Measurements: Height: 5\' 4"  (162.6 cm) Weight: 115 lb 12.8 oz (52.527 kg) IBW/kg (Calculated) : 59.2 HEPARIN DW (KG): 53.2   Vital Signs: Temp: 98.9 F (37.2 C) (05/16 0353) Temp Source: Oral (05/16 0353) BP: 104/62 mmHg (05/16 0600) Pulse Rate: 63 (05/16 0600)  Labs:  Recent Labs  01/11/16 1933 01/12/16 0614 01/12/16 1101 01/12/16 2130 01/13/16 0542 01/13/16 0543  HGB 11.0* 10.2*  --   --   --  10.1*  HCT 32.4* 32.1*  --   --   --  31.6*  PLT 332 300  --   --   --  274  APTT 32  --   --   --   --   --   LABPROT 15.8*  --   --   --   --   --   INR 1.24  --   --   --   --   --   HEPARINUNFRC  --   --  0.18* 0.33 0.19*  --   CREATININE 0.78 0.65  --   --   --   --   TROPONINI <0.03  --   --   --   --   --    Estimated Creatinine Clearance: 60.2 mL/min (by C-G formula based on Cr of 0.65).  Assessment: 75 y.o. male with new onset Afib for heparin.  Goal of Therapy:  Heparin level 0.3-0.7 units/ml   Plan:  Heparin 2000 units IV bolus, then increase heparin 1100 units/hr Check heparin level in 8 hours.  Phillis Knack, PharmD, BCPS

## 2016-01-13 NOTE — Progress Notes (Signed)
Attempted to place foley x2, with 77f & 146f Met resistance each time. Pt stated "it was very painful when inserting catheter." Notified MD. Will continue to monitor.    WhRuben ReasonRN

## 2016-01-13 NOTE — Progress Notes (Signed)
ANTICOAGULATION CONSULT NOTE  Pharmacy Consult for heparin Indication: atrial fibrillation  No Known Allergies  Patient Measurements: Height: 5\' 4"  (162.6 cm) Weight: 115 lb 12.8 oz (52.527 kg) IBW/kg (Calculated) : 59.2 HEPARIN DW (KG): 53.2   Vital Signs: Temp: 99 F (37.2 C) (05/16 1100) Temp Source: Oral (05/16 1100) BP: 110/67 mmHg (05/16 1100) Pulse Rate: 107 (05/16 1100)  Labs:  Recent Labs  01/11/16 1933 01/12/16 0614 01/12/16 1101 01/12/16 2130 01/13/16 0542 01/13/16 0543 01/13/16 0814  HGB 11.0* 10.2*  --   --   --  10.1*  --   HCT 32.4* 32.1*  --   --   --  31.6*  --   PLT 332 300  --   --   --  274  --   APTT 32  --   --   --   --   --   --   LABPROT 15.8*  --   --   --   --   --   --   INR 1.24  --   --   --   --   --   --   HEPARINUNFRC  --   --  0.18* 0.33 0.19*  --   --   CREATININE 0.78 0.65  --   --   --   --  1.17  TROPONINI <0.03  --   --   --   --   --   --    Estimated Creatinine Clearance: 41.1 mL/min (by C-G formula based on Cr of 1.17).  Assessment: 75 yo male transferred from [facility] for abd pain x 2 weeks  PMH: COPD, HTN, HLD  Anticoagulation: new onset afib? On iv hep gtt. On 950 units/hr level was 0.33, then 0.19  Orders to change dose were not entered from AM level this morning, please disregard previous pharmacy note from Phillis Knack, PharmD  Nephrology: SCr 1.17  Heme: H&H 10.1/31.6, Plt 274  Goal of Therapy:  Heparin level 0.3-0.7 units/ml   Plan:  Bolus 1500 units/hr Increase drip to Heparin 1100 units/hr 1800 HL Daily HL, CBC Monitor s/sx of bleeding F/U plans for [facility]  Levester Fresh, PharmD, BCPS, [facility] Clinical Pharmacist Pager [phone] 01/13/2016 11:05 AM

## 2016-01-13 NOTE — Progress Notes (Signed)
PROGRESS NOTE                                                                                                                                                                                                             Patient Demographics:    Steve Gomez, is a 75 y.o. male, DOB - 23-Jul-1941, [id number]  Admit date - 01/11/2016   Admitting Physician Theressa Millard, MD  Outpatient Primary MD for the patient is Alonza Bogus, MD  LOS - 2  Chief Complaint  Patient presents with  . Abdominal Pain       Brief Narrative   75 y.o. male with a history of COPD, HTN, Hyperlipidemia who presents to the ED with complaints of 8/10 RUQ ABD Pain for 2 weeks worsening over the past 24 hours with Nauses and vomiting today. He was evaluated in the ED and found to be in Atrial fibrillation with RVR rate in the 130's, and a CT scan of the ABD Pelvis revealed Findings of Gas in the Left Lobe of Liver concerning for Portal Venous gas. [facility] surgery Dr Hulen Skains was consulted and arrangements were made to transfer to [facility] to an SDU Bed   Subjective:    [place] :    Active Problems:   Abdominal pain -CT scan of abdomen reporting linear branching air in the left lobe of liver concerning for portal venous gas. Also reporting under distention of stomach versus gastritis and multiple nonobstructing bilateral renal calculi. Pain seems to be suprapubic I have consulted urology to evaluate and see if there is anything from their standpoint that can be done to assist the patient. - placed on Zosyn more so for leukocytosis.    Atrial fibrillation with RVR (HCC) - Heart rate currently well controlled on beta blocker. We'll discontinue Cardizem drip. As patient became hypotensive on this. - Patient currently anticoagulated on heparin    Ischemia of small intestine (HCC) - CT scan reported  findings that were suspicious for this such Gen. surgery consulted and no plans for operation    COPD (chronic obstructive pulmonary disease) ([place]) - Currently on Xopenex as needed for shortness of breath   Hypertension   High cholesterol   Hypokalemia - We'll IV by Gen surgery   Code Status : Full  Family Communication  : Discussed with patient and spouse  Disposition Plan  : Pending further recommendations from specialist  Consults  : Gen. surgery Urology  Procedures  : None currently  DVT Prophylaxis  :Heparin   Lab Results  Component Value Date   PLT 274 01/13/2016    Antibiotics  :  Placed on Zosyn  Anti-infectives    Start     Dose/Rate Route Frequency Ordered Stop   01/12/16 1000  piperacillin-tazobactam (ZOSYN) IVPB 3.375 g     3.375 g 12.5 mL/hr over 240 Minutes Intravenous Every 8 hours 01/12/16 0857          Objective:   Filed Vitals:   01/13/16 1400 01/13/16 1500 01/13/16 1530 01/13/16 1549  BP: 84/58 84/59 97/66    Pulse: 79 99 95   Temp:    98.6 F (37 C)  TempSrc:    Axillary  Resp: 23 21 25    Height:      Weight:      SpO2: 93% 90% 93%     Wt Readings from Last 3 Encounters:  01/13/16 52.527 kg (115 lb 12.8 oz)  08/18/15 58.968 kg (130 lb)  07/14/15 58.968 kg (130 lb)     Intake/Output Summary (Last 24 hours) at 01/13/16 1726 Last data filed at 01/13/16 0600  Gross per 24 hour  Intake 764.28 ml  Output    325 ml  Net 439.28 ml     Physical Exam  Awake Alert,  In no acute distress Supple Neck,No JVD, No cervical lymphadenopathy appreciated.  Symmetrical Chest wall movement, Good air movement bilaterally, CTAB RRR,No Gallops,Rubs or new Murmurs, No Parasternal Heave +ve B.Sounds, Abd Soft, positive suprapubic discomfort, No organomegaly appreciated, No rebound - guarding or rigidity. No Cyanosis, Clubbing or edema, No new Rash or bruise    Data Review:    CBC  Recent Labs Lab 01/11/16 1933 01/12/16 0614 01/13/16 0543   WBC 19.8* 26.7* 37.1*  HGB 11.0* 10.2* 10.1*  HCT 32.4* 32.1* 31.6*  PLT 332 300 274  MCV 87.3 87.0 86.8  MCH 29.6 27.6 27.7  MCHC 34.0 31.8 32.0  RDW 14.3 14.4 14.6  LYMPHSABS 1.6  --   --   MONOABS 1.4*  --   --   EOSABS 0.1  --   --   BASOSABS 0.0  --   --     Chemistries   Recent Labs Lab 01/11/16 1933 01/12/16 0614 01/13/16 0814  NA 134* 137 139  K 2.8* 3.4* 3.1*  CL 94* 102 99*  CO2 31 26 26   GLUCOSE 129* 104* 106*  BUN 24* 13 26*  CREATININE 0.78 0.65 1.17  CALCIUM 9.2 8.5* 9.0  MG 1.7  --   --   AST 20  --   --   ALT 12*  --   --   ALKPHOS 124  --   --   BILITOT 0.5  --   --    ------------------------------------------------------------------------------------------------------------------ No results for input(s): CHOL, HDL, LDLCALC, TRIG, CHOLHDL, LDLDIRECT in the last 72 hours.  No results found for: HGBA1C ------------------------------------------------------------------------------------------------------------------ No results for input(s): TSH, T4TOTAL, T3FREE, THYROIDAB in the last 72 hours.  Invalid input(s): FREET3 ------------------------------------------------------------------------------------------------------------------ No results for input(s): VITAMINB12, FOLATE, FERRITIN, TIBC, IRON, RETICCTPCT in the last 72 hours.  Coagulation profile  Recent Labs Lab 01/11/16 1933  INR 1.24    No results for input(s): DDIMER in the last 72 hours.  Cardiac Enzymes  Recent Labs Lab 01/11/16 1933  TROPONINI <0.03   ------------------------------------------------------------------------------------------------------------------  No results found for: BNP  Inpatient Medications  Scheduled Meds: . labetalol  100 mg Oral BID  . nicotine  14 mg Transdermal Daily  . piperacillin-tazobactam (ZOSYN)  IV  3.375 g Intravenous Q8H  . potassium chloride  10 mEq Intravenous Q1 Hr x 4  . sodium chloride flush  3 mL Intravenous Q12H    Continuous Infusions: . heparin 1,100 Units/hr (01/13/16 1102)   PRN Meds:.sodium chloride, acetaminophen **OR** acetaminophen, HYDROmorphone (DILAUDID) injection, levalbuterol, ondansetron **OR** ondansetron (ZOFRAN) IV, sodium chloride flush  Micro Results Recent Results (from the past 240 hour(s))  MRSA PCR Screening     Status: None   Collection Time: 01/12/16  2:32 AM  Result Value Ref Range Status   MRSA by PCR NEGATIVE NEGATIVE Final    Comment:        The GeneXpert MRSA Assay (FDA approved for NASAL specimens only), is one component of a comprehensive MRSA colonization surveillance program. It is not intended to diagnose MRSA infection nor to guide or monitor treatment for MRSA infections.     Radiology Reports Ct Abdomen Pelvis W Contrast  01/11/2016  ADDENDUM REPORT: 01/11/2016 22:37 ADDENDUM: These results were called by telephone at the time of interpretation on <CurrentDate> at <CurrentTime> to Dr. Shanon Brow , who verbally acknowledged these results. Electronically Signed   By: Anner Crete M.D.   On: 01/11/2016 22:37  01/11/2016  CLINICAL DATA:  76 year old male with bilateral lower quadrant abdominal pain. EXAM: CT ABDOMEN AND PELVIS WITH CONTRAST TECHNIQUE: Multidetector CT imaging of the abdomen and pelvis was performed using the standard protocol following bolus administration of intravenous contrast. CONTRAST:  16mL ISOVUE-300 IOPAMIDOL (ISOVUE-300) INJECTION 61% COMPARISON:  None. FINDINGS: The visualized lung bases are clear. There is coronary vascular calcification. No intra-abdominal free air or free fluid. Cholecystectomy. Linear and branching air noted within the peripheral left lobe of the liver concerning for of the portal venous gas. The liver is otherwise unremarkable. The pancreas, spleen, and the right adrenal gland appear unremarkable. There is mild thickening of the left adrenal gland. Multiple small nonobstructing bilateral renal calculi measuring up  to 4 mm. There is mild fullness of the renal collecting systems bilaterally. There is a 5 mm calculus in the proximal right ureter. There is a 2.6 cm exophytic hypodense lesion arising from the posterior cortex of the right kidney most compatible with a cyst. The urinary bladder is distended. There is trabecular appearance of the bladder wall compatible with chronic bladder outlet obstruction. The prostate gland is mildly enlarged and measures 4.5 cm in transverse diameter. There is apparent thickening of the distal stomach in the region of the antrum and pylorus which may be related to underdistention or represent gastritis. Clinical correlation is recommended. Multiple small duodenal diverticula without definite active inflammation. There is no evidence of bowel obstruction. The visualized appendix appears unremarkable. There is aortoiliac atherosclerotic disease. There is focal area of advanced atherosclerotic plaque with narrowing of the aorta just inferior to the origins of the renal arteries. The aorta is tortuous. There is atherosclerotic calcifications of the origins of the celiac axis and SMA. The origins the celiac axis, SMA appear patent. The SMV, and main portal vein are patent. No portal venous gas identified. There is no adenopathy. Set the abdominal wall soft tissues appear unremarkable. There is osteopenia with degenerative changes of the spine. No acute fracture. IMPRESSION: Linear branching air in the left lobe of the liver concerning for portal venous gas. This is of indeterminate etiology,  however the possibility of ischemic bowel is not excluded. Clinical correlation is recommended. Underdistention of the stomach versus gastritis. No evidence of bowel obstruction. No CT evidence for acute appendicitis. A 5 mm proximal right ureteral calculus with small nonobstructing bilateral renal calculi. Mild fullness of the renal collecting systems bilaterally. Trabecular appearance of the urinary bladder  compatible with chronic bladder outlet obstruction. Electronically Signed: By: Anner Crete M.D. On: 01/11/2016 22:26   Dg Chest Port 1 View  01/13/2016  CLINICAL DATA:  R06.02 (ICD-10-CM) - SOB (shortness of breath)COPD (chronic obstructive pulmonary disease) ([place]) J44.9Smokes 1ppd/60 yrs EXAM: PORTABLE CHEST 1 VIEW COMPARISON:  Chest CT, 07/23/2015.  Chest radiograph, 02/06/2015. FINDINGS: There is new opacity at the right lung base when compared to the prior studies silhouetting the right hemidiaphragm. Mild opacities noted in the medial left lung base. Remainder of the lungs is clear. Left lung is hyperexpanded. No pneumothorax. Cardiac silhouette is normal in size. No mediastinal or hilar masses or convincing adenopathy. Bony thorax is demineralized but grossly intact. IMPRESSION: 1. New bilateral lung base opacity, greater on the right. This may be atelectasis, pneumonia or a combination. A central obstructing lesion is not excluded. Consider followup chest CT with contrast for further assessment. 2. No evidence of pulmonary edema. Electronically Signed   By: Lajean Manes M.D.   On: 01/13/2016 11:21    Time Spent in minutes  35   Velvet Bathe M.D on 01/13/2016 at 5:26 PM  Between 7am to 7pm - Pager - [phone]  After 7pm go to [email or web address] - password [facility]  [facility] -  Office  [phone]

## 2016-01-13 NOTE — Progress Notes (Signed)
ANTICOAGULATION CONSULT NOTE  Pharmacy Consult for heparin Indication: atrial fibrillation  No Known Allergies  Patient Measurements: Height: 5\' 4"  (162.6 cm) Weight: 115 lb 12.8 oz (52.527 kg) IBW/kg (Calculated) : 59.2 HEPARIN DW (KG): 53.2   Vital Signs: Temp: 98.6 F (37 C) (05/16 1549) Temp Source: Axillary (05/16 1549) BP: 91/69 mmHg (05/16 1700) Pulse Rate: 103 (05/16 1700)  Labs:  Recent Labs  01/11/16 1933 01/12/16 0614  01/12/16 2130 01/13/16 0542 01/13/16 0543 01/13/16 0814 01/13/16 1849  HGB 11.0* 10.2*  --   --   --  10.1*  --   --   HCT 32.4* 32.1*  --   --   --  31.6*  --   --   PLT 332 300  --   --   --  274  --   --   APTT 32  --   --   --   --   --   --   --   LABPROT 15.8*  --   --   --   --   --   --   --   INR 1.24  --   --   --   --   --   --   --   HEPARINUNFRC  --   --   < > 0.33 0.19*  --   --  0.13*  CREATININE 0.78 0.65  --   --   --   --  1.17  --   TROPONINI <0.03  --   --   --   --   --   --   --   < > = values in this interval not displayed. Estimated Creatinine Clearance: 41.1 mL/min (by C-G formula based on Cr of 1.17).  Assessment: 75 yo male transferred from [facility] for abd pain x 2 weeks. He is noted with new onset afib on heparin at 1100 units/hr and heparin level is below goal on 1100 units/hr   Goal of Therapy:  Heparin level 0.3-0.7 units/ml   Plan:   -heparin 1500 units IV bolus and increase infusion to 1250 units/hr -Heparin level in 8 hours and daily wth CBC daily  Hildred Laser, Pharm D 01/13/2016 8:26 PM

## 2016-01-13 NOTE — Progress Notes (Signed)
[facility] Progress Note     Subjective: He notes spinal back pain, no abdominal or flank pain, but does admit to discomfort suprapubically over his bladder.  No N/V, thirsty and hungry.  Condom catheter came off last night because it was irritating him.  He says its difficult to urinate, he has to strain.  He has the feeling to urinate when I pushed on him suprapubically.  Up OOB some.  Still has chronic SOB.  Objective: Vital signs in last 24 hours: Temp:  [98.9 F (37.2 C)-100 F (37.8 C)] 98.9 F (37.2 C) (05/16 0353) Pulse Rate:  [62-152] 63 (05/16 0600) Resp:  [17-33] 24 (05/16 0600) BP: (102-149)/(58-134) 104/62 mmHg (05/16 0600) SpO2:  [87 %-96 %] 90 % (05/16 0600) Weight:  [115 lb 12.8 oz (52.527 kg)] 115 lb 12.8 oz (52.527 kg) (05/16 0353) Last BM Date: 01/12/16  Intake/Output from previous day: 05/15 0701 - 05/16 0700 In: 1014.3 [I.V.:614.3; IV Piggyback:400] Out: 325 [Urine:325] Intake/Output this shift:    PE: Gen:  Alert, NAD, pleasant Card:  Tachycardic, irr rhythm, no M/G/R heard Pulm:  CTA, no W/R/R, good effort Abd: Soft, mild distension, tenderness suprapubically, few BS, no HSM   Lab Results:   Recent Labs  01/12/16 0614 01/13/16 0543  WBC 26.7* 37.1*  HGB 10.2* 10.1*  HCT 32.1* 31.6*  PLT 300 274   BMET  Recent Labs  01/11/16 1933 01/12/16 0614  NA 134* 137  K 2.8* 3.4*  CL 94* 102  CO2 31 26  GLUCOSE 129* 104*  BUN 24* 13  CREATININE 0.78 0.65  CALCIUM 9.2 8.5*   PT/INR  Recent Labs  01/11/16 1933  LABPROT 15.8*  INR 1.24   CMP     Component Value Date/Time   NA 137 01/12/2016 0614   K 3.4* 01/12/2016 0614   CL 102 01/12/2016 0614   CO2 26 01/12/2016 0614   GLUCOSE 104* 01/12/2016 0614   BUN 13 01/12/2016 0614   CREATININE 0.65 01/12/2016 0614   CALCIUM 8.5* 01/12/2016 0614   PROT 6.9 01/11/2016 1933   ALBUMIN 3.5 01/11/2016 1933   AST 20 01/11/2016 1933   ALT 12* 01/11/2016 1933   ALKPHOS 124  01/11/2016 1933   BILITOT 0.5 01/11/2016 1933   GFRNONAA >60 01/12/2016 0614   GFRAA >60 01/12/2016 0614   Lipase     Component Value Date/Time   LIPASE 31 01/11/2016 1933       Studies/Results: Ct Abdomen Pelvis W Contrast  01/11/2016  ADDENDUM REPORT: 01/11/2016 22:37 ADDENDUM: These results were called by telephone at the time of interpretation on <CurrentDate> at <CurrentTime> to Dr. Shanon Brow , who verbally acknowledged these results. Electronically Signed   By: Anner Crete M.D.   On: 01/11/2016 22:37  01/11/2016  CLINICAL DATA:  75 year old male with bilateral lower quadrant abdominal pain. EXAM: CT ABDOMEN AND PELVIS WITH CONTRAST TECHNIQUE: Multidetector CT imaging of the abdomen and pelvis was performed using the standard protocol following bolus administration of intravenous contrast. CONTRAST:  191mL ISOVUE-300 IOPAMIDOL (ISOVUE-300) INJECTION 61% COMPARISON:  None. FINDINGS: The visualized lung bases are clear. There is coronary vascular calcification. No intra-abdominal free air or free fluid. Cholecystectomy. Linear and branching air noted within the peripheral left lobe of the liver concerning for of the portal venous gas. The liver is otherwise unremarkable. The pancreas, spleen, and the right adrenal gland appear unremarkable. There is mild thickening of the left adrenal gland. Multiple small nonobstructing bilateral renal calculi measuring up to  4 mm. There is mild fullness of the renal collecting systems bilaterally. There is a 5 mm calculus in the proximal right ureter. There is a 2.6 cm exophytic hypodense lesion arising from the posterior cortex of the right kidney most compatible with a cyst. The urinary bladder is distended. There is trabecular appearance of the bladder wall compatible with chronic bladder outlet obstruction. The prostate gland is mildly enlarged and measures 4.5 cm in transverse diameter. There is apparent thickening of the distal stomach in the region of  the antrum and pylorus which may be related to underdistention or represent gastritis. Clinical correlation is recommended. Multiple small duodenal diverticula without definite active inflammation. There is no evidence of bowel obstruction. The visualized appendix appears unremarkable. There is aortoiliac atherosclerotic disease. There is focal area of advanced atherosclerotic plaque with narrowing of the aorta just inferior to the origins of the renal arteries. The aorta is tortuous. There is atherosclerotic calcifications of the origins of the celiac axis and SMA. The origins the celiac axis, SMA appear patent. The SMV, and main portal vein are patent. No portal venous gas identified. There is no adenopathy. Set the abdominal wall soft tissues appear unremarkable. There is osteopenia with degenerative changes of the spine. No acute fracture. IMPRESSION: Linear branching air in the left lobe of the liver concerning for portal venous gas. This is of indeterminate etiology, however the possibility of ischemic bowel is not excluded. Clinical correlation is recommended. Underdistention of the stomach versus gastritis. No evidence of bowel obstruction. No CT evidence for acute appendicitis. A 5 mm proximal right ureteral calculus with small nonobstructing bilateral renal calculi. Mild fullness of the renal collecting systems bilaterally. Trabecular appearance of the urinary bladder compatible with chronic bladder outlet obstruction. Electronically Signed: By: Anner Crete M.D. On: 01/11/2016 22:26    Anti-infectives: Anti-infectives    Start     Dose/Rate Route Frequency Ordered Stop   01/12/16 1000  piperacillin-tazobactam (ZOSYN) IVPB 3.375 g     3.375 g 12.5 mL/hr over 240 Minutes Intravenous Every 8 hours 01/12/16 0857         Assessment/Plan Abdominal pain - CT findings concerning for ?Portal venous gas, ?Antrum/pylorus gastritis Nausea/vomiting -The patients pain has much improved since  admission. He does not have an acute abdomen. His vitals are stable. Would recommend conservative management for now with serial exams.  -I'm concerned he may have obstructing kidney stones given fullness in collecting system on CT on 5/14, may be worse now given rising WBC.   -IVF, pain control, antiemetics -SCD's and on heparin drip -Ambulate and IS  -Ordered bmet, consider repeat lactic acid if pain returns -Allow water, ice, and sips with meds.  May be able to advance to clears later. -Will follow Duodenal diverticula without inflammation Celiac and SMA athersclerosis - non obstructive Leukocytosis - up to 37.1 today ?urinary source, possible hydro on CT from kidney stones, ordered foley catheter for decompression.  On Zosyn Day #2. B/L renal calculi with fullness of B/L renal collecting systems - may need urology, may be source of leukocytosis AFIB with RVR Hypokalemia - supplemented  HTN/HLD H/o hernia repair with mesh, cholecystectomy, and AAA repair    LOS: 2 days    Nat Christen 01/13/2016, 7:28 AM Pager: [phone]  (7am - 4:30pm M-F; 7am - 11:30am Sa/Su)

## 2016-01-13 NOTE — Progress Notes (Signed)
Patient's oxygen saturations 84-86% on 4 liters nasal cannula. Patient stated that he did not feel short of breath and patient did not appear to be in any distress. Oxygen increased to 6 liters; patient's oxygen saturations now 88-90%. Will continue to monitor.

## 2016-01-13 NOTE — Progress Notes (Signed)
Notified MD of SBP in 80's after 250 ml bolus. New orders received. Will continue to monitor.    Ruben Reason, RN

## 2016-01-13 NOTE — Consult Note (Signed)
Urology Consult  Referring physician:   Velvet Bathe, MD                             cc:        Dr. Jana Hakim Reason for referral:  Acute Urinary Retention  Impression/Assessment:   Acute urinary Retention: 1000cc, drained, coude catheter.   Plan:   Coude cath passed: 1000cc obtained.  Recommend leave foley catheter until pt is strong enough to stand to void/walk to bathroom. Would begin flomax 0.52m/day, but only when pt is able to tolerate alpha blocker therapy: NOTE: can cause hypotention/dizziness. F/u Urology as necessary. Dr. HJana Hakimis Primary physician.    History of Present Illness:   75y.o. Male, seen emergently for acute urinary retention.  He was admitted with : 1.  COPD, 2.  HTN,  3. Hyperlipidemia 4.  2 weeks of RUQ pain, 8/10  worsening over the 24 hours pta on 01/13/16, with Nausea and vomiting .Evaluation in the ED showed  Atrial fibrillation with RVR rate in the 130's;  and a CT scan of the ABD Pelvis revealed gas in the Left Lobe of Liver concerning for Portal Venous gas. [facility] surgery Dr WHulen Skainswas consulted [facility]  Past Medical History  Diagnosis Date  . COPD (chronic obstructive pulmonary disease) ([facility]   . Hypertension   . High cholesterol   . S/P AAA repair    Past Urologic Hx: Pt denies hx of difficulty voiding.   Medications:Hospital medications:                        1. Heparin,                        2. Zosyn, 3.375 g                         3. Cardizem  Prior to Admission medications   Medication Sig Start Date End Date Taking? Authorizing Provider  acetaminophen (TYLENOL) 500 MG tablet Take 500 mg by mouth every 6 (six) hours as needed for mild pain or moderate pain.   Yes Historical Provider, MD  aspirin EC 81 MG tablet Take 81 mg by mouth daily.   Yes Historical Provider, MD  chlorthalidone (HYGROTON) 25 MG tablet Take 1 tablet by mouth  daily. 12/01/14  Yes Historical Provider, MD  labetalol (NORMODYNE) 300 MG tablet Take 1 tablet by mouth 2 (two) times daily. 12/01/14  Yes Historical Provider, MD  Multiple Vitamins-Minerals (MULTIVITAMINS THER. W/MINERALS) TABS tablet Take 1 tablet by mouth daily. 11/30/14  Yes Historical Provider, MD  OXYGEN Inhale 2 L into the lungs at bedtime.   Yes Historical Provider, MD  simvastatin (ZOCOR) 40 MG tablet Take 1 tablet by mouth daily. 12/01/14  Yes Historical Provider, MD         Allergies: No Known Allergies  History reviewed. No pertinent family history.  Social History:  reports that he has been smoking Cigarettes.  He has been smoking about 1.00 pack per day. He does not have any smokeless tobacco history on file. He reports that he drinks alcohol. He reports that he does not use illicit drugs.  ROS:  Constitutional: No Weight Loss, No Weight Gain, Night Sweats, Fevers, Chills, Dizziness, Light Headedness, Fatigue, or Generalized Weakness HEENT:  No Headaches, Difficulty Swallowing,Tooth/Dental Problems,Sore Throat,  No Sneezing, Rhinitis, Ear Ache, Nasal Congestion, or Post Nasal Drip,  Cardio-vascular: No Chest pain, Orthopnea, PND, Edema in Lower Extremities, Anasarca, Dizziness, Palpitations  Resp: No Dyspnea, No DOE, No Productive Cough, No Non-Productive Cough, No Hemoptysis, No Wheezing.  GI: No Heartburn, Indigestion, +Abdominal Pain, +Nausea, +Vomiting, Diarrhea, Constipation, Hematemesis, Hematochezia, Melena, Change in Bowel Habits, Loss of Appetite  GU: No Dysuria, No Change in Color of Urine, No Urgency or Urinary Frequency, No Flank pain.  Musculoskeletal: No Joint Pain or Swelling, No Decreased Range of Motion, No Back Pain.  Neurologic: No Syncope, No Seizures, Muscle Weakness, Paresthesia, Vision Disturbance or Loss, No Diplopia, No Vertigo, No Difficulty Walking,  Skin: No Rash or Lesions. Psych: No Change in Mood or Affect, No  Depression or Anxiety, No Memory loss, No Confusion, or Hallucinations  Physical Exam:  Vital signs in last 24 hours: Temp:  [98.6 F (37 C)-99.3 F (37.4 C)] 98.6 F (37 C) (05/16 1549) Pulse Rate:  [63-152] 103 (05/16 1700) Resp:  [17-33] 24 (05/16 1700) BP: (80-127)/(48-70) 91/69 mmHg (05/16 1700) SpO2:  [87 %-96 %] 92 % (05/16 1700) Weight:  [52.527 kg (115 lb 12.8 oz)] 52.527 kg (115 lb 12.8 oz) (05/16 0353) Physical Exam: General: Thin male, alert, and cooperative. CHEST WALL: No tenderness CHEST: Normal respiration, clear to auscultation bilaterally HEART: Regular rate and rhythm; no murmurs rubs or gallops BACK: No kyphosis or scoliosis; No CVA tenderness ABDOMEN: Suprapubic distention, c/w distended bladder. Positive Bowel Sounds, Soft +tenderness in RUQ, No Rebound NO Guarding, No Hepatosplenomegaly Rectal Exam: Not done EXTREMITIES: No cyanosis , Clubbing or Edema Genitalia: not examined PULSES: 2+ and symmetric SKIN: Normal hydration no rash or ulceration CNS: Alert and Oriented x 4, No Focal Deficits Vascular: pulses palpable throughout  Laboratory Data:  Results for orders placed or performed during the hospital encounter of 01/11/16 (from the past 72 hour(s))  Comprehensive metabolic panel     Status: Abnormal   Collection Time: 01/11/16  7:33 PM  Result Value Ref Range   Sodium 134 (L) 135 - 145 mmol/L   Potassium 2.8 (L) 3.5 - 5.1 mmol/L   Chloride 94 (L) 101 - 111 mmol/L   CO2 31 22 - 32 mmol/L   Glucose, Bld 129 (H) 65 - 99 mg/dL   BUN 24 (H) 6 - 20 mg/dL   Creatinine, Ser 0.78 0.61 - 1.24 mg/dL   Calcium 9.2 8.9 - 10.3 mg/dL   Total Protein 6.9 6.5 - 8.1 g/dL   Albumin 3.5 3.5 - 5.0 g/dL   AST 20 15 - 41 U/L   ALT 12 (L) 17 - 63 U/L   Alkaline Phosphatase 124 38 - 126 U/L   Total Bilirubin 0.5 0.3 - 1.2 mg/dL   GFR calc non Af Amer >60 >60 mL/min   GFR calc Af Amer >60 >60 mL/min    Comment: (NOTE) The eGFR has been calculated using the CKD EPI  equation. This calculation has not been validated in all clinical situations. eGFR's persistently <60 mL/min signify possible Chronic Kidney Disease.    Anion gap 9 5 - 15  CBC with Differential     Status: Abnormal   Collection Time: 01/11/16  7:33 PM  Result Value Ref Range   WBC 19.8 (H) 4.0 - 10.5 K/uL   RBC 3.71 (L) 4.22 - 5.81 MIL/uL   Hemoglobin 11.0 (L) 13.0 - 17.0 g/dL   HCT 32.4 (L) 39.0 - 52.0 %  MCV 87.3 78.0 - 100.0 fL   MCH 29.6 26.0 - 34.0 pg   MCHC 34.0 30.0 - 36.0 g/dL   RDW 14.3 11.5 - 15.5 %   Platelets 332 150 - 400 K/uL   Neutrophils Relative % 84 %   Neutro Abs 16.7 (H) 1.7 - 7.7 K/uL   Lymphocytes Relative 8 %   Lymphs Abs 1.6 0.7 - 4.0 K/uL   Monocytes Relative 7 %   Monocytes Absolute 1.4 (H) 0.1 - 1.0 K/uL   Eosinophils Relative 1 %   Eosinophils Absolute 0.1 0.0 - 0.7 K/uL   Basophils Relative 0 %   Basophils Absolute 0.0 0.0 - 0.1 K/uL  Lipase, blood     Status: None   Collection Time: 01/11/16  7:33 PM  Result Value Ref Range   Lipase 31 11 - 51 U/L  Troponin I     Status: None   Collection Time: 01/11/16  7:33 PM  Result Value Ref Range   Troponin I <0.03 <0.031 ng/mL    Comment:        NO INDICATION OF MYOCARDIAL INJURY.   APTT     Status: None   Collection Time: 01/11/16  7:33 PM  Result Value Ref Range   aPTT 32 24 - 37 seconds  Protime-INR     Status: Abnormal   Collection Time: 01/11/16  7:33 PM  Result Value Ref Range   Prothrombin Time 15.8 (H) 11.6 - 15.2 seconds   INR 1.24 0.00 - 1.49  Magnesium     Status: None   Collection Time: 01/11/16  7:33 PM  Result Value Ref Range   Magnesium 1.7 1.7 - 2.4 mg/dL  Urinalysis, Routine w reflex microscopic     Status: None   Collection Time: 01/11/16  8:00 PM  Result Value Ref Range   Color, Urine YELLOW YELLOW   APPearance CLEAR CLEAR   Specific Gravity, Urine 1.015 1.005 - 1.030   pH 6.5 5.0 - 8.0   Glucose, UA NEGATIVE NEGATIVE mg/dL   Hgb urine dipstick NEGATIVE NEGATIVE    Bilirubin Urine NEGATIVE NEGATIVE   Ketones, ur NEGATIVE NEGATIVE mg/dL   Protein, ur NEGATIVE NEGATIVE mg/dL   Nitrite NEGATIVE NEGATIVE   Leukocytes, UA NEGATIVE NEGATIVE    Comment: MICROSCOPIC NOT DONE ON URINES WITH NEGATIVE PROTEIN, BLOOD, LEUKOCYTES, NITRITE, OR GLUCOSE <1000 mg/dL.  Lactic acid, plasma     Status: None   Collection Time: 01/11/16 11:10 PM  Result Value Ref Range   Lactic Acid, Venous 1.3 0.5 - 2.0 mmol/L  MRSA PCR Screening     Status: None   Collection Time: 01/12/16  2:32 AM  Result Value Ref Range   MRSA by PCR NEGATIVE NEGATIVE    Comment:        The GeneXpert MRSA Assay (FDA approved for NASAL specimens only), is one component of a comprehensive MRSA colonization surveillance program. It is not intended to diagnose MRSA infection nor to guide or monitor treatment for MRSA infections.   Basic metabolic panel     Status: Abnormal   Collection Time: 01/12/16  6:14 AM  Result Value Ref Range   Sodium 137 135 - 145 mmol/L   Potassium 3.4 (L) 3.5 - 5.1 mmol/L   Chloride 102 101 - 111 mmol/L   CO2 26 22 - 32 mmol/L   Glucose, Bld 104 (H) 65 - 99 mg/dL   BUN 13 6 - 20 mg/dL   Creatinine, Ser 0.65 0.61 - 1.24 mg/dL  Calcium 8.5 (L) 8.9 - 10.3 mg/dL   GFR calc non Af Amer >60 >60 mL/min   GFR calc Af Amer >60 >60 mL/min    Comment: (NOTE) The eGFR has been calculated using the CKD EPI equation. This calculation has not been validated in all clinical situations. eGFR's persistently <60 mL/min signify possible Chronic Kidney Disease.    Anion gap 9 5 - 15  CBC     Status: Abnormal   Collection Time: 01/12/16  6:14 AM  Result Value Ref Range   WBC 26.7 (H) 4.0 - 10.5 K/uL   RBC 3.69 (L) 4.22 - 5.81 MIL/uL   Hemoglobin 10.2 (L) 13.0 - 17.0 g/dL   HCT 32.1 (L) 39.0 - 52.0 %   MCV 87.0 78.0 - 100.0 fL   MCH 27.6 26.0 - 34.0 pg   MCHC 31.8 30.0 - 36.0 g/dL   RDW 14.4 11.5 - 15.5 %   Platelets 300 150 - 400 K/uL  Heparin level (unfractionated)      Status: Abnormal   Collection Time: 01/12/16 11:01 AM  Result Value Ref Range   Heparin Unfractionated 0.18 (L) 0.30 - 0.70 IU/mL    Comment:        IF HEPARIN RESULTS ARE BELOW EXPECTED VALUES, AND PATIENT DOSAGE HAS BEEN CONFIRMED, SUGGEST FOLLOW UP TESTING OF ANTITHROMBIN III LEVELS.   Heparin level (unfractionated)     Status: None   Collection Time: 01/12/16  9:30 PM  Result Value Ref Range   Heparin Unfractionated 0.33 0.30 - 0.70 IU/mL    Comment:        IF HEPARIN RESULTS ARE BELOW EXPECTED VALUES, AND PATIENT DOSAGE HAS BEEN CONFIRMED, SUGGEST FOLLOW UP TESTING OF ANTITHROMBIN III LEVELS.   Heparin level (unfractionated)     Status: Abnormal   Collection Time: 01/13/16  5:42 AM  Result Value Ref Range   Heparin Unfractionated 0.19 (L) 0.30 - 0.70 IU/mL    Comment:        IF HEPARIN RESULTS ARE BELOW EXPECTED VALUES, AND PATIENT DOSAGE HAS BEEN CONFIRMED, SUGGEST FOLLOW UP TESTING OF ANTITHROMBIN III LEVELS.   CBC     Status: Abnormal   Collection Time: 01/13/16  5:43 AM  Result Value Ref Range   WBC 37.1 (H) 4.0 - 10.5 K/uL   RBC 3.64 (L) 4.22 - 5.81 MIL/uL   Hemoglobin 10.1 (L) 13.0 - 17.0 g/dL   HCT 31.6 (L) 39.0 - 52.0 %   MCV 86.8 78.0 - 100.0 fL   MCH 27.7 26.0 - 34.0 pg   MCHC 32.0 30.0 - 36.0 g/dL   RDW 14.6 11.5 - 15.5 %   Platelets 274 150 - 400 K/uL  Basic metabolic panel     Status: Abnormal   Collection Time: 01/13/16  8:14 AM  Result Value Ref Range   Sodium 139 135 - 145 mmol/L   Potassium 3.1 (L) 3.5 - 5.1 mmol/L   Chloride 99 (L) 101 - 111 mmol/L   CO2 26 22 - 32 mmol/L   Glucose, Bld 106 (H) 65 - 99 mg/dL   BUN 26 (H) 6 - 20 mg/dL   Creatinine, Ser 1.17 0.61 - 1.24 mg/dL   Calcium 9.0 8.9 - 10.3 mg/dL   GFR calc non Af Amer 60 (L) >60 mL/min   GFR calc Af Amer >60 >60 mL/min    Comment: (NOTE) The eGFR has been calculated using the CKD EPI equation. This calculation has not been validated in all clinical situations. eGFR's  persistently <60 mL/min signify possible Chronic Kidney Disease.    Anion gap 14 5 - 15  Urinalysis, Routine w reflex microscopic (not at [facility])     Status: Abnormal   Collection Time: 01/13/16 11:46 AM  Result Value Ref Range   Color, Urine YELLOW YELLOW   APPearance CLEAR CLEAR   Specific Gravity, Urine 1.019 1.005 - 1.030   pH 6.0 5.0 - 8.0   Glucose, UA NEGATIVE NEGATIVE mg/dL   Hgb urine dipstick SMALL (A) NEGATIVE   Bilirubin Urine NEGATIVE NEGATIVE   Ketones, ur NEGATIVE NEGATIVE mg/dL   Protein, ur NEGATIVE NEGATIVE mg/dL   Nitrite NEGATIVE NEGATIVE   Leukocytes, UA NEGATIVE NEGATIVE  Urine microscopic-add on     Status: Abnormal   Collection Time: 01/13/16 11:46 AM  Result Value Ref Range   Squamous Epithelial / LPF 6-30 (A) NONE SEEN   WBC, UA 6-30 0 - 5 WBC/hpf   RBC / HPF 0-5 0 - 5 RBC/hpf   Bacteria, UA FEW (A) NONE SEEN   Urine-Other SPERM PRESENT   Heparin level (unfractionated)     Status: Abnormal   Collection Time: 01/13/16  6:49 PM  Result Value Ref Range   Heparin Unfractionated 0.13 (L) 0.30 - 0.70 IU/mL    Comment:        IF HEPARIN RESULTS ARE BELOW EXPECTED VALUES, AND PATIENT DOSAGE HAS BEEN CONFIRMED, SUGGEST FOLLOW UP TESTING OF ANTITHROMBIN III LEVELS.    Recent Results (from the past 240 hour(s))  MRSA PCR Screening     Status: None   Collection Time: 01/12/16  2:32 AM  Result Value Ref Range Status   MRSA by PCR NEGATIVE NEGATIVE Final    Comment:        The GeneXpert MRSA Assay (FDA approved for NASAL specimens only), is one component of a comprehensive MRSA colonization surveillance program. It is not intended to diagnose MRSA infection nor to guide or monitor treatment for MRSA infections.    Creatinine:  Recent Labs  01/11/16 1933 01/12/16 0614 01/13/16 0814  CREATININE 0.78 0.65 1.17   Baseline Creatinine:    Caylah Plouff I Nahlia Hellmann 01/13/2016, 8:37 PM

## 2016-01-13 NOTE — Progress Notes (Addendum)
Notified MD of pt SBP in 80's. Cardizem turned off at this time. New orders received. Will continue to monitor.    Ruben Reason, RN

## 2016-01-13 NOTE — Progress Notes (Signed)
Bladder scanned pt and amount was greater than 926ml. Notified Dr. Wendee Beavers. MD stated "that urology has been consulted and should see pt." Will continue to monitor.    Ruben Reason, RN

## 2016-01-14 ENCOUNTER — Inpatient Hospital Stay (HOSPITAL_COMMUNITY): Payer: Commercial Managed Care - HMO

## 2016-01-14 DIAGNOSIS — E876 Hypokalemia: Secondary | ICD-10-CM

## 2016-01-14 DIAGNOSIS — I4891 Unspecified atrial fibrillation: Secondary | ICD-10-CM

## 2016-01-14 DIAGNOSIS — I10 Essential (primary) hypertension: Secondary | ICD-10-CM

## 2016-01-14 DIAGNOSIS — K559 Vascular disorder of intestine, unspecified: Secondary | ICD-10-CM

## 2016-01-14 DIAGNOSIS — R1030 Lower abdominal pain, unspecified: Secondary | ICD-10-CM

## 2016-01-14 DIAGNOSIS — J449 Chronic obstructive pulmonary disease, unspecified: Secondary | ICD-10-CM

## 2016-01-14 DIAGNOSIS — E7800 Pure hypercholesterolemia, unspecified: Secondary | ICD-10-CM

## 2016-01-14 DIAGNOSIS — N200 Calculus of kidney: Secondary | ICD-10-CM

## 2016-01-14 DIAGNOSIS — N320 Bladder-neck obstruction: Secondary | ICD-10-CM | POA: Diagnosis present

## 2016-01-14 HISTORY — DX: Calculus of kidney: N20.0

## 2016-01-14 HISTORY — DX: Bladder-neck obstruction: N32.0

## 2016-01-14 LAB — BASIC METABOLIC PANEL
Anion gap: 12 (ref 5–15)
BUN: 32 mg/dL — AB (ref 6–20)
CO2: 24 mmol/L (ref 22–32)
CREATININE: 1.06 mg/dL (ref 0.61–1.24)
Calcium: 8.6 mg/dL — ABNORMAL LOW (ref 8.9–10.3)
Chloride: 104 mmol/L (ref 101–111)
GFR calc Af Amer: >60 mL/min (ref >60)
GLUCOSE: 89 mg/dL (ref 65–99)
POTASSIUM: 3 mmol/L — AB (ref 3.5–5.1)
SODIUM: 140 mmol/L (ref 135–145)

## 2016-01-14 LAB — ECHOCARDIOGRAM COMPLETE
HEIGHTINCHES: 64 in
WEIGHTICAEL: 1843.2 [oz_av]

## 2016-01-14 LAB — CBC
HEMATOCRIT: 30.1 % — AB (ref 39.0–52.0)
Hemoglobin: 9.7 g/dL — ABNORMAL LOW (ref 13.0–17.0)
MCH: 27.6 pg (ref 26.0–34.0)
MCHC: 32.2 g/dL (ref 30.0–36.0)
MCV: 85.8 fL (ref 78.0–100.0)
PLATELETS: 271 10*3/uL (ref 150–400)
RBC: 3.51 MIL/uL — ABNORMAL LOW (ref 4.22–5.81)
RDW: 14.8 % (ref 11.5–15.5)
WBC: 26.8 10*3/uL — ABNORMAL HIGH (ref 4.0–10.5)

## 2016-01-14 LAB — HEPARIN LEVEL (UNFRACTIONATED)
HEPARIN UNFRACTIONATED: 0.29 [IU]/mL — AB (ref 0.30–0.70)
HEPARIN UNFRACTIONATED: 0.37 [IU]/mL (ref 0.30–0.70)
Heparin Unfractionated: 0.22 IU/mL — ABNORMAL LOW (ref 0.30–0.70)

## 2016-01-14 LAB — TSH: TSH: 1.248 u[IU]/mL (ref 0.350–4.500)

## 2016-01-14 MED ORDER — HEPARIN BOLUS VIA INFUSION
750.0000 [IU] | Freq: Once | INTRAVENOUS | Status: AC
  Administered 2016-01-14: 750 [IU] via INTRAVENOUS
  Filled 2016-01-14: qty 750

## 2016-01-14 MED ORDER — POTASSIUM CHLORIDE 10 MEQ/100ML IV SOLN
10.0000 meq | INTRAVENOUS | Status: AC
  Administered 2016-01-14 (×4): 10 meq via INTRAVENOUS
  Filled 2016-01-14 (×4): qty 100

## 2016-01-14 MED ORDER — PROPRANOLOL HCL 10 MG PO TABS
10.0000 mg | ORAL_TABLET | Freq: Three times a day (TID) | ORAL | Status: DC
  Administered 2016-01-14: 10 mg via ORAL
  Filled 2016-01-14 (×3): qty 1

## 2016-01-14 MED ORDER — DIGOXIN 125 MCG PO TABS
0.1250 mg | ORAL_TABLET | Freq: Every day | ORAL | Status: DC
  Administered 2016-01-15 – 2016-01-17 (×3): 0.125 mg via ORAL
  Filled 2016-01-14 (×3): qty 1

## 2016-01-14 MED ORDER — DIGOXIN 0.25 MG/ML IJ SOLN
0.2500 mg | Freq: Four times a day (QID) | INTRAMUSCULAR | Status: AC
  Administered 2016-01-14 (×2): 0.25 mg via INTRAVENOUS
  Filled 2016-01-14 (×2): qty 2

## 2016-01-14 NOTE — Care Management Note (Addendum)
Case Management Note  Patient Details  Name: Steve Gomez MRN: [id number] Date of Birth: December 12, 1940  Subjective/Objective:  Pt admitted for Abdominal Pain- A fib RVR and dizziness. Pt is from home with wife. Per wife pt has DME Concentrator and cpap at home. Per wife pt ambulates well and will not need HH Services at d/c.                 Action/Plan: No further needs from CM at this time.    Expected Discharge Date:  01/14/16               Expected Discharge Plan:  Home/Self Care  In-House Referral:  NA  Discharge planning Services  CM Consult  Post Acute Care Choice:  NA Choice offered to:  NA  DME Arranged:  N/A (Pt has a concentrator and cpap for night use at home. ) DME Agency:  NA  HH Arranged:  NA HH Agency:  NA  Status of Service:  Completed, signed off  Medicare Important Message Given:  Yes Date Medicare IM Given:    Medicare IM give by:    Date Additional Medicare IM Given:    Additional Medicare Important Message give by:     If discussed at [place] of Stay Meetings, dates discussed:    Additional Comments: 1451 01-16-16 Jacqlyn Krauss, RN,BSN [phone] CM did provide pt with 30 day free card for Xarelto. Co pay will be 47.00 and pt is aware. No further needs at this time. Pt would like 90 day supply sent to [facility]. Thanks  Bethena Roys, RN 01/14/2016, 4:23 PM

## 2016-01-14 NOTE — Progress Notes (Signed)
ANTICOAGULATION CONSULT NOTE  Pharmacy Consult for heparin Indication: atrial fibrillation  No Known Allergies  Patient Measurements: Height: 5\' 4"  (162.6 cm) Weight: 115 lb 3.2 oz (52.254 kg) IBW/kg (Calculated) : 59.2 HEPARIN DW (KG): 53.2   Vital Signs: Temp: 99 F (37.2 C) (05/17 2000) Temp Source: Oral (05/17 2000) BP: 111/72 mmHg (05/17 2116) Pulse Rate: 114 (05/17 2116)  Labs:  Recent Labs  01/12/16 [id number]  01/13/16 0543 01/13/16 0814  01/14/16 0516 01/14/16 1230 01/14/16 2054  HGB 10.2*  --  10.1*  --   --  9.7*  --   --   HCT 32.1*  --  31.6*  --   --  30.1*  --   --   PLT 300  --  274  --   --  271  --   --   HEPARINUNFRC  --   < >  --   --   < > 0.29* 0.22* 0.37  CREATININE 0.65  --   --  1.17  --  1.06  --   --   < > = values in this interval not displayed. Estimated Creatinine Clearance: 45.2 mL/min (by C-G formula based on Cr of 1.06).  Assessment: 75 yo male with new onset AFib PMH: COPD, HTN, HLD, AAA repair Pharmacy consulted for heparin dosing/monitoring. Heparin level this evening is therapeutic x 1 on heparin 1450 units/hr. Will continue with current rate and check a confirmatory level in the morning  Goal of Therapy:  Heparin level 0.3-0.7 units/ml   Plan:  Continue heparin drip at 1450 units/hr Check anti-Xa level in 8 hours and daily while on heparin Continue to monitor H&H and platelets F/U plans for [facility]   Thank you for allowing [place] to participate in this patients care. Jens Som, PharmD Pager: [phone]  01/14/2016 9:30 PM

## 2016-01-14 NOTE — Progress Notes (Signed)
ANTICOAGULATION CONSULT NOTE  Pharmacy Consult for heparin Indication: atrial fibrillation  No Known Allergies  Patient Measurements: Height: 5\' 4"  (162.6 cm) Weight: 115 lb 3.2 oz (52.254 kg) IBW/kg (Calculated) : 59.2 HEPARIN DW (KG): 53.2   Vital Signs: Temp: 98 F (36.7 C) (05/17 1307) Temp Source: Oral (05/17 1307) BP: 90/67 mmHg (05/17 1200) Pulse Rate: 95 (05/17 1200)  Labs:  Recent Labs  01/11/16 1933 01/12/16 0614  01/13/16 0543 01/13/16 0814 01/13/16 1849 01/14/16 0516 01/14/16 1230  HGB 11.0* 10.2*  --  10.1*  --   --  9.7*  --   HCT 32.4* 32.1*  --  31.6*  --   --  30.1*  --   PLT 332 300  --  274  --   --  271  --   APTT 32  --   --   --   --   --   --   --   LABPROT 15.8*  --   --   --   --   --   --   --   INR 1.24  --   --   --   --   --   --   --   HEPARINUNFRC  --   --   < >  --   --  0.13* 0.29* 0.22*  CREATININE 0.78 0.65  --   --  1.17  --  1.06  --   TROPONINI <0.03  --   --   --   --   --   --   --   < > = values in this interval not displayed. Estimated Creatinine Clearance: 45.2 mL/min (by C-G formula based on Cr of 1.06).  Assessment: 75 yo male transferred from [facility] for abd pain x 2 weeks  PMH: COPD, HTN, HLD  Anticoagulation: new onset afib? On iv hep gtt. On 1350 units/hr level 0.22  Infectious Disease: Abx for abd infection. WBC 26.8, AF  CV: still in afib to add digoxin for rate control  Nephrology: SCr 1.06  Heme: H&H 9.7/30.1, Plt 271  Goal of Therapy:  Heparin level 0.3-0.7 units/ml   Plan:  Bolus 750 units heparin x 1 Increase drip to Heparin 1450 units/hr 2100 HL Daily HL, CBC Monitor s/sx of bleeding F/U plans for [facility]  Levester Fresh, PharmD, BCPS, [facility] Clinical Pharmacist Pager [phone] 01/14/2016 1:26 PM

## 2016-01-14 NOTE — Progress Notes (Signed)
ANTICOAGULATION CONSULT NOTE  Pharmacy Consult for heparin Indication: atrial fibrillation  No Known Allergies  Patient Measurements: Height: 5\' 4"  (162.6 cm) Weight: 115 lb 12.8 oz (52.527 kg) IBW/kg (Calculated) : 59.2 HEPARIN DW (KG): 53.2   Vital Signs: Temp: 98.4 F (36.9 C) (05/16 2100) Temp Source: Oral (05/16 2100) BP: 95/45 mmHg (05/17 0500) Pulse Rate: 106 (05/17 0500)  Labs:  Recent Labs  01/11/16 1933 01/12/16 [id number]  01/13/16 0542 01/13/16 0543 01/13/16 0814 01/13/16 1849 01/14/16 0516  HGB 11.0* 10.2*  --   --  10.1*  --   --  9.7*  HCT 32.4* 32.1*  --   --  31.6*  --   --  30.1*  PLT 332 300  --   --  274  --   --  271  APTT 32  --   --   --   --   --   --   --   LABPROT 15.8*  --   --   --   --   --   --   --   INR 1.24  --   --   --   --   --   --   --   HEPARINUNFRC  --   --   < > 0.19*  --   --  0.13* 0.29*  CREATININE 0.78 0.65  --   --   --  1.17  --  1.06  TROPONINI <0.03  --   --   --   --   --   --   --   < > = values in this interval not displayed. Estimated Creatinine Clearance: 45.4 mL/min (by C-G formula based on Cr of 1.06).  Assessment: 75 yo male on heparin for new onset afib. Heparin level remains slightly subthereapeutic on 1250 units/hr. Hgb down a bit. No issues with line or bleeding reported per RN.  Goal of Therapy:  Heparin level 0.3-0.7 units/ml   Plan:   -Increase heparin to 1350 units/hr -Heparin level in 8 hours  Sherlon Handing, PharmD, BCPS Clinical pharmacist, pager [phone]  01/14/2016 6:12 AM

## 2016-01-14 NOTE — Progress Notes (Signed)
Progress Note    Steve Gomez  [id number] DOB: [birth year]  DOA: 01/11/2016 PCP: Alonza Bogus, MD    Brief Narrative:   Steve Gomez is an 75 y.o. male with a PMH of COPD, hypertension and hyperlipidemia who was admitted 01/11/16 with a chief complaint of a two-week history of right upper quadrant pain and nausea/vomiting over the past 24 hours. Upon initial evaluation in the ED, he was found to be in atrial fibrillation with RVR (heart rate in the 130s). CT scan of abdomen/pelvis showed gas in the left lobe of the liver concerning for portal venous gas. Gen. surgery subsequently consulted.  Assessment/Plan:   Principal Problem:   Abdominal pain, rule out ischemia of small intestine/leukocytosis Evaluated by general surgery with no indication for operative management. Treating conservatively with IV fluids, pain control and antinausea medications. Also on empiric Zosyn which we will continue given very high white blood cell count. Diet advanced to clear liquids. Multiple possible etiologies for abdominal pain including urinary retention, gastritis, mesenteric/intestinal ischemia, nephrolithiasis. At this point, would monitor closely. Lactic acid not elevated, which is reassuring. Currently on a full liquid diet.  Active Problems:   Atrial fibrillation with RVR ([place]) Initially treated with Cardizem drip which was discontinued 01/13/16 due to hypotension. Currently on labetalol. We'll start digoxin. Discussed case with Dr. Johnsie Cancel of cardiology who recommended further evaluation with 2-D echo and oral anticoagulation. He has a CHADSVASC score of at least 3 (HTN, age, vasc dx (AAA and atherosclerosis of celiac and SMA on CT scan). Labetalol discontinued and placed on low-dose propranolol per cardiology recommendations.     COPD (chronic obstructive pulmonary disease) ([place]) with ongoing tobacco abuse Counseled regarding cessation. Continue nicotine patch.    Hypertension Managed  with labetalol 300 mg twice a day at home. Blood pressures soft here. The beta low discontinued and started on low-dose propranolol per cardiology recommendations.    High cholesterol Will check lipid panel in the morning.    Hypokalemia Given 4 runs of IV potassium today. Check magnesium and potassium again in the morning.    Atherosclerosis/AAA Status post AAA repair over 20 years ago. Imaging shows celiac and SMA atherosclerosis.    Renal calculi/urinary retention Evaluated by urology. Coud catheter placed for urinary retention with recommendations to leave the catheter in until the patient can void/walk to the bathroom. Flomax cautiously recommended due to potential side effects of hypotension/dizziness. We'll consider adding this tomorrow if his blood pressure is stable.   Family Communication/Anticipated D/C date and plan/Code Status   DVT prophylaxis: On IV heparin. Code Status: Full Code.  Family Communication: No family currently at the bedside. Disposition Plan: Home with spouse when heart rate controlled and transitioned to oral anticoagulation. Likely will need another 24-48 hours in the hospital.   Medical Consultants:    Cardiology  Procedures:   2D echo [id number]: Systolic function was normal. The estimated ejection fraction was in the range of 55% to 60%. Wall motion was normal; there were no regional wall motion abnormalities.  Anti-Infectives:   Zosyn 01/12/16--->  Subjective:   Steve Gomez denies chest pain, dyspnea, and nausea/vomiting. Continues to have some abdominal pain.  Objective:    Filed Vitals:   01/14/16 1200 01/14/16 1307 01/14/16 1400 01/14/16 1630  BP: 90/67  106/62 99/68  Pulse: 95  98 99  Temp:  98 F (36.7 C)    TempSrc:  Oral    Resp: 23  27 24   Height:  Weight:      SpO2: 91%  94% 99%    Intake/Output Summary (Last 24 hours) at 01/14/16 1706 Last data filed at 01/14/16 1442  Gross per 24 hour  Intake 834.16 ml    Output   2475 ml  Net -1640.84 ml   Filed Weights   01/12/16 0227 01/13/16 0353 01/14/16 0600  Weight: 53.162 kg (117 lb 3.2 oz) 52.527 kg (115 lb 12.8 oz) 52.254 kg (115 lb 3.2 oz)    Exam: General exam: Appears calm and comfortable. Frail-appearing. Respiratory system: Clear to auscultation. Respiratory effort normal. Cardiovascular system: S1 & S2 heard, rhythm irregularly irregular. No JVD,  rubs, gallops or clicks. II/VI systolic murmur. Gastrointestinal system: Abdomen is nondistended, soft and nontender. No organomegaly or masses felt. Normal bowel sounds heard. Central nervous system: Alert and oriented. No focal neurological deficits. Extremities: No clubbing, edema, or cyanosis. Skin: No rashes, lesions or ulcers Psychiatry: Judgement and insight appear normal. Mood & affect appropriate.   Data Reviewed:   I have personally reviewed following labs and imaging studies:  Labs: Basic Metabolic Panel:  Recent Labs Lab 01/11/16 1933 01/12/16 0614 01/13/16 0814 01/14/16 0516  NA 134* 137 139 140  K 2.8* 3.4* 3.1* 3.0*  CL 94* 102 99* 104  CO2 31 26 26 24   GLUCOSE 129* 104* 106* 89  BUN 24* 13 26* 32*  CREATININE 0.78 0.65 1.17 1.06  CALCIUM 9.2 8.5* 9.0 8.6*  MG 1.7  --   --   --    GFR Estimated Creatinine Clearance: 45.2 mL/min (by C-G formula based on Cr of 1.06). Liver Function Tests:  Recent Labs Lab 01/11/16 1933  AST 20  ALT 12*  ALKPHOS 124  BILITOT 0.5  PROT 6.9  ALBUMIN 3.5    Recent Labs Lab 01/11/16 1933  LIPASE 31   Coagulation profile  Recent Labs Lab 01/11/16 1933  INR 1.24    CBC:  Recent Labs Lab 01/11/16 1933 01/12/16 0614 01/13/16 0543 01/14/16 0516  WBC 19.8* 26.7* 37.1* 26.8*  NEUTROABS 16.7*  --   --   --   HGB 11.0* 10.2* 10.1* 9.7*  HCT 32.4* 32.1* 31.6* 30.1*  MCV 87.3 87.0 86.8 85.8  PLT 332 300 274 271   Cardiac Enzymes:  Recent Labs Lab 01/11/16 1933  TROPONINI <0.03   Sepsis Labs:  Recent  Labs Lab 01/11/16 1933 01/11/16 2310 01/12/16 0614 01/13/16 0543 01/14/16 0516  WBC 19.8*  --  26.7* 37.1* 26.8*  LATICACIDVEN  --  1.3  --   --   --    Urine analysis:    Component Value Date/Time   COLORURINE YELLOW 01/13/2016 [place] 01/13/2016 1146   LABSPEC 1.019 01/13/2016 1146   PHURINE 6.0 01/13/2016 1146   GLUCOSEU NEGATIVE 01/13/2016 1146   HGBUR SMALL* 01/13/2016 1146   [place] 01/13/2016 [place] 01/13/2016 [place] 01/13/2016 1146   NITRITE NEGATIVE 01/13/2016 1146   [place] 01/13/2016 1146   Microbiology Recent Results (from the past 240 hour(s))  MRSA PCR Screening     Status: None   Collection Time: 01/12/16  2:32 AM  Result Value Ref Range Status   MRSA by PCR NEGATIVE NEGATIVE Final    Comment:        The GeneXpert MRSA Assay (FDA approved for NASAL specimens only), is one component of a comprehensive MRSA colonization surveillance program. It is not intended to diagnose MRSA infection nor to  guide or monitor treatment for MRSA infections.     Radiology: Dg Chest Port 1 View  01/13/2016  CLINICAL DATA:  R06.02 (ICD-10-CM) - SOB (shortness of breath)COPD (chronic obstructive pulmonary disease) ([place]) J44.9Smokes 1ppd/60 yrs EXAM: PORTABLE CHEST 1 VIEW COMPARISON:  Chest CT, 07/23/2015.  Chest radiograph, 02/06/2015. FINDINGS: There is new opacity at the right lung base when compared to the prior studies silhouetting the right hemidiaphragm. Mild opacities noted in the medial left lung base. Remainder of the lungs is clear. Left lung is hyperexpanded. No pneumothorax. Cardiac silhouette is normal in size. No mediastinal or hilar masses or convincing adenopathy. Bony thorax is demineralized but grossly intact. IMPRESSION: 1. New bilateral lung base opacity, greater on the right. This may be atelectasis, pneumonia or a combination. A central obstructing lesion is not excluded.  Consider followup chest CT with contrast for further assessment. 2. No evidence of pulmonary edema. Electronically Signed   By: Lajean Manes M.D.   On: 01/13/2016 11:21    Medications:   . [START ON 01/15/2016] digoxin  0.125 mg Oral Daily  . nicotine  14 mg Transdermal Daily  . piperacillin-tazobactam (ZOSYN)  IV  3.375 g Intravenous Q8H  . propranolol  10 mg Oral TID  . sodium chloride flush  3 mL Intravenous Q12H   Continuous Infusions: . heparin 1,450 Units/hr (01/14/16 1351)    Time spent: 35 minutes.  The patient is medically complex with multiple co-morbidities and is at high risk for clinical deterioration and requires high complexity decision making and coordination of care with cardiology.    LOS: 3 days   [facility] Pager [phone]. If unable to reach me by pager, please call my cell phone at [phone].  *Please refer to [email or web address], password [email or web address] to get updated schedule on who will round on this patient, as hospitalists switch teams weekly. If 7PM-7AM, please contact night-coverage at [email or web address], password [email or web address] for any overnight needs.  01/14/2016, 5:06 PM

## 2016-01-14 NOTE — Evaluation (Addendum)
Physical Therapy Evaluation Patient Details Name: Steve Gomez MRN: [id number] DOB: 1941-06-19 Today's Date: 01/14/2016   History of Present Illness  Pt adm with abd pain of ?etiology and afib with rvr. Pt with acute urinary retention with catheter placed. PMH - AAA s/p repair, HTN and COPD  Clinical Impression  Pt admitted with above diagnosis and presents to PT with functional limitations due to deficits listed below (See PT problem list). Pt needs skilled PT to maximize independence and safety to allow discharge to home with family. Expect pt will be close to baseline with mobility fairly quickly.     Follow Up Recommendations No PT follow up    Equipment Recommendations  None recommended by PT    Recommendations for Other Services       Precautions / Restrictions Precautions Precautions: Fall      Mobility  Bed Mobility Overal bed mobility: Modified Independent                Transfers Overall transfer level: Needs assistance Equipment used: None Transfers: Sit to/from Stand Sit to Stand: Min guard         General transfer comment: Assist for balance and safety  Ambulation/Gait Ambulation/Gait assistance: Min guard Ambulation Distance (Feet): 170 Feet Assistive device: None Gait Pattern/deviations: Step-through pattern;Decreased stride length;Drifts right/left     General Gait Details: Slightly unsteady but no overt loss of balance. HR 110's to 130's.  Stairs            Wheelchair Mobility    Modified Rankin (Stroke Patients Only)       Balance Overall balance assessment: Needs assistance Sitting-balance support: No upper extremity supported;Feet supported Sitting balance-Leahy Scale: Normal     Standing balance support: No upper extremity supported Standing balance-Leahy Scale: Fair                               Pertinent Vitals/Pain      Home Living Family/patient expects to be discharged to:: Private  residence Living Arrangements: Spouse/significant other Available Help at Discharge: Family Type of Home: House Home Access: Stairs to enter Entrance Stairs-Rails: Right Entrance Stairs-Number of Steps: 3-4 Home Layout: One level Home Equipment: None      Prior Function Level of Independence: Independent               Hand Dominance        Extremity/Trunk Assessment   Upper Extremity Assessment: Overall WFL for tasks assessed           Lower Extremity Assessment: Generalized weakness         Communication   Communication: No difficulties  Cognition Arousal/Alertness: Awake/alert Behavior During Therapy: WFL for tasks assessed/performed Overall Cognitive Status: Within Functional Limits for tasks assessed                      General Comments      Exercises        Assessment/Plan    PT Assessment Patient needs continued PT services  PT Diagnosis Difficulty walking;Generalized weakness   PT Problem List Decreased strength;Decreased activity tolerance;Decreased balance;Decreased mobility  PT Treatment Interventions DME instruction;Gait training;Functional mobility training;Therapeutic exercise;Therapeutic activities;Balance training;Patient/family education   PT Goals (Current goals can be found in the Care Plan section) Acute Rehab PT Goals Patient Stated Goal: return home PT Goal Formulation: With patient Time For Goal Achievement: 01/21/16 Potential to Achieve Goals: Good    Frequency  Min 3X/week   Barriers to discharge        Co-evaluation               End of Session Equipment Utilized During Treatment: Oxygen Activity Tolerance: Patient tolerated treatment well Patient left: in bed;with call bell/phone within reach;with family/visitor present Nurse Communication: Mobility status         Time: [id number] PT Time Calculation (min) (ACUTE ONLY): 15 min   Charges:   PT Evaluation $PT Eval Moderate Complexity: 1  Procedure     PT G Codes:        Lilliah Priego 01/23/16, 3:52 PM [facility] PT [phone]

## 2016-01-14 NOTE — Progress Notes (Signed)
[facility] Progress Note     Subjective: Pt's suprapubic discomfort is much improved since foley catheter placed.  Feels SOB, no CP.  Not been OOB yet.  Therapy eval pending.  Thirsty/hungry.  Tolerating water and ice.  Last BM on 5/15.  Passing flatus.  Feels less distended.  Objective: Vital signs in last 24 hours: Temp:  [98.4 F (36.9 C)-99 F (37.2 C)] 99 F (37.2 C) (05/17 0600) Pulse Rate:  [67-130] 69 (05/17 0600) Resp:  [18-25] 23 (05/17 0600) BP: (80-118)/(45-70) 101/66 mmHg (05/17 0600) SpO2:  [88 %-94 %] 92 % (05/17 0600) Weight:  [115 lb 3.2 oz (52.254 kg)] 115 lb 3.2 oz (52.254 kg) (05/17 0600) Last BM Date: 01/12/16  Intake/Output from previous day: 05/16 0701 - 05/17 0700 In: 527 [I.V.:77; IV Piggyback:450] Out: 1900 [Urine:1900] Intake/Output this shift:    PE: Gen:  Alert, NAD, pleasant Card:  RRR, no M/G/R heard Pulm:  CTA, course breath sounds at base, no W/R/R, good effort Abd: Soft, NT/ND, +BS, no HSM   Lab Results:   Recent Labs  01/13/16 0543 01/14/16 0516  WBC 37.1* 26.8*  HGB 10.1* 9.7*  HCT 31.6* 30.1*  PLT 274 271   BMET  Recent Labs  01/13/16 0814 01/14/16 0516  NA 139 140  K 3.1* 3.0*  CL 99* 104  CO2 26 24  GLUCOSE 106* 89  BUN 26* 32*  CREATININE 1.17 1.06  CALCIUM 9.0 8.6*   PT/INR  Recent Labs  01/11/16 1933  LABPROT 15.8*  INR 1.24   CMP     Component Value Date/Time   NA 140 01/14/2016 0516   K 3.0* 01/14/2016 0516   CL 104 01/14/2016 0516   CO2 24 01/14/2016 0516   GLUCOSE 89 01/14/2016 0516   BUN 32* 01/14/2016 0516   CREATININE 1.06 01/14/2016 0516   CALCIUM 8.6* 01/14/2016 0516   PROT 6.9 01/11/2016 1933   ALBUMIN 3.5 01/11/2016 1933   AST 20 01/11/2016 1933   ALT 12* 01/11/2016 1933   ALKPHOS 124 01/11/2016 1933   BILITOT 0.5 01/11/2016 1933   GFRNONAA >60 01/14/2016 0516   GFRAA >60 01/14/2016 0516   Lipase     Component Value Date/Time   LIPASE 31 01/11/2016 1933        Studies/Results: Dg Chest Port 1 View  01/13/2016  CLINICAL DATA:  R06.02 (ICD-10-CM) - SOB (shortness of breath)COPD (chronic obstructive pulmonary disease) ([facility]) J44.9Smokes 1ppd/60 yrs EXAM: PORTABLE CHEST 1 VIEW COMPARISON:  Chest CT, 07/23/2015.  Chest radiograph, 02/06/2015. FINDINGS: There is new opacity at the right lung base when compared to the prior studies silhouetting the right hemidiaphragm. Mild opacities noted in the medial left lung base. Remainder of the lungs is clear. Left lung is hyperexpanded. No pneumothorax. Cardiac silhouette is normal in size. No mediastinal or hilar masses or convincing adenopathy. Bony thorax is demineralized but grossly intact. IMPRESSION: 1. New bilateral lung base opacity, greater on the right. This may be atelectasis, pneumonia or a combination. A central obstructing lesion is not excluded. Consider followup chest CT with contrast for further assessment. 2. No evidence of pulmonary edema. Electronically Signed   By: Lajean Manes M.D.   On: 01/13/2016 11:21    Anti-infectives: Anti-infectives    Start     Dose/Rate Route Frequency Ordered Stop   01/12/16 1000  piperacillin-tazobactam (ZOSYN) IVPB 3.375 g     3.375 g 12.5 mL/hr over 240 Minutes Intravenous Every 8 hours 01/12/16 0857  Assessment/Plan Abdominal pain - CT findings concerning for ?Portal venous gas, ?Antrum/pylorus gastritis Nausea/vomiting -The patients pain has much improved since admission. He does not have an acute abdomen. His vitals are stable. Continue conservative management. -IVF, pain control, antiemetics -SCD's and on heparin drip -Ambulate and IS  -Advance to clears, fulls at dinner Duodenal diverticula without inflammation Celiac and SMA athersclerosis - non obstructive Leukocytosis - down to 26.8 today ?urinary source, possible hydro on CT from kidney stones, has coude catheter. On Zosyn Day #3. B/L lung base opacity ?atelectasis vs pneumonia  - will leave the treatment of this up to medicine Acute urinary retention & B/L renal calculi with fullness of B/L renal collecting systems - urology following, foley to stay in until getting up and moving around more AFIB with RVR - may need cards Hypokalemia - supplemented again today HTN/HLD H/o hernia repair with mesh, cholecystectomy, and AAA repair  Deconditioning - PT eval    LOS: 3 days    Nat Christen 01/14/2016, 7:17 AM Pager: [phone]  (7am - 4:30pm M-F; 7am - 11:30am Sa/Su)

## 2016-01-14 NOTE — Consult Note (Signed)
CARDIOLOGY CONSULT NOTE   Patient ID: Steve Gomez MRN: [id number] DOB/AGE: [birth year] 75 y.o.  Admit date: 01/11/2016  Requesting Physician: Dr. Rockne Menghini Primary Physician:   Alonza Bogus, MD Primary Cardiologist:   New Reason for Consultation:   afib with RVR  HPI: Steve Gomez is a 75 y.o. male with a history of AAA s/p repair, HLD, HTN and COPD/tobacco abuse who presented to [facility] ED on 01/12/16 with abdominal pain with N/V. CT findings concerning for portal venous gas and found to be in afib with RVR and he was transferred to [facility] for further evaluation and treatment.   He presented with abdominal pain x2 weeks and n/v prompting him to seek medical attention. Found to be in afib with RVR and started on IV heparin and cardizem gtt. Cardizem later discontinued due to hypotension. Now only on labetelol 100mg  BID and digoxin 0.125mg  daily. CT scan of abdomen reporting linear branching air in the left lobe of liver concerning for portal venous gas. Also reporting underdistention of stomach versus gastritis and multiple nonobstructing bilateral renal calculi. Surgery was consulted. He has been treated with NPO, IVFs and pain meds. Abdominal pain has improved with conservative measures but he developed suprapubic pain and urology has been consulted. He had acute urinary retention and he released 1000 cc urine after coude cath. They recommend keeping foley in until patient can void and walk to the bathroom by himself. He has been on IV abx and white count is improving.   Cardiology consulted for help with afib. He has no past cardiac history and has never seen a cardiologist. Patient has no awareness of dysrhythmia even with fast rates. No palpitations, CP or SOB. No LE edema, orthopnea or PND. No dizziness or syncope. He does get some mild SOB with exertion. He has a 40 pack year history of smoking. He says he has had no appetite and significant weight loss over the past year.    Past  Medical History  Diagnosis Date  . COPD (chronic obstructive pulmonary disease) ([place])   . Hypertension   . High cholesterol   . S/P AAA repair      History reviewed. No pertinent past surgical history.  No Known Allergies  I have reviewed the patient's current medications . digoxin  0.25 mg Intravenous Q6H  . [START ON 01/15/2016] digoxin  0.125 mg Oral Daily  . heparin  750 Units Intravenous Once  . labetalol  100 mg Oral BID  . nicotine  14 mg Transdermal Daily  . piperacillin-tazobactam (ZOSYN)  IV  3.375 g Intravenous Q8H  . sodium chloride flush  3 mL Intravenous Q12H   . heparin 1,350 Units/hr (01/14/16 0616)   sodium chloride, acetaminophen **OR** acetaminophen, HYDROmorphone (DILAUDID) injection, levalbuterol, ondansetron **OR** ondansetron (ZOFRAN) IV, sodium chloride flush  Prior to Admission medications   Medication Sig Start Date End Date Taking? Authorizing Provider  acetaminophen (TYLENOL) 500 MG tablet Take 500 mg by mouth every 6 (six) hours as needed for mild pain or moderate pain.   Yes Historical Provider, MD  aspirin EC 81 MG tablet Take 81 mg by mouth daily.   Yes Historical Provider, MD  chlorthalidone (HYGROTON) 25 MG tablet Take 1 tablet by mouth daily. 12/01/14  Yes Historical Provider, MD  labetalol (NORMODYNE) 300 MG tablet Take 1 tablet by mouth 2 (two) times daily. 12/01/14  Yes Historical Provider, MD  Multiple Vitamins-Minerals (MULTIVITAMINS THER. W/MINERALS) TABS tablet Take 1 tablet by mouth daily. 11/30/14  Yes Historical Provider, MD  OXYGEN Inhale 2 L into the lungs at bedtime.   Yes Historical Provider, MD  simvastatin (ZOCOR) 40 MG tablet Take 1 tablet by mouth daily. 12/01/14  Yes Historical Provider, MD     Social History   Social History  . Marital Status: Married    Spouse Name: N/A  . Number of Children: N/A  . Years of Education: N/A   Occupational History  . Not on file.   Social History Main Topics  . Smoking status: Current Every  Day Smoker -- 1.00 packs/day    Types: Cigarettes  . Smokeless tobacco: Not on file  . Alcohol Use: Yes     Comment: "just a couple of swallows of wine every night"  . Drug Use: No  . Sexual Activity: Not on file   Other Topics Concern  . Not on file   Social History Narrative    No family status information on file.   History reviewed. No pertinent family history.   ROS:  Full 14 point review of systems complete and found to be negative unless listed above.  Physical Exam: Blood pressure 90/67, pulse 95, temperature 98 F (36.7 C), temperature source Oral, resp. rate 23, height 5\' 4"  (1.626 m), weight 115 lb 3.2 oz (52.254 kg), SpO2 91 %.  General: Well developed, well nourished, male in no acute distress. Elderly and frail appearing.  Head: Eyes PERRLA, No xanthomas.   Normocephalic and atraumatic, oropharynx without edema or exudate.   Lungs: CTAB Heart: HRRR S1 S2, no rub/gallop, Heart irregular rate and tachy with S1, S2  ++murmur loudest at apex. pulses are 2+ extrem.   Neck: No carotid bruits. No lymphadenopathy.  No JVD. Abdomen: Bowel sounds present, abdomen soft and non-tender without masses or hernias noted. Msk:  No spine or cva tenderness. No weakness, no joint deformities or effusions. Extremities: No clubbing or cyanosis. No LE  edema.  Neuro: Alert and oriented X 3. No focal deficits noted. Psych:  Good affect, responds appropriately Skin: No rashes or lesions noted.  Labs:   Lab Results  Component Value Date   WBC 26.8* 01/14/2016   HGB 9.7* 01/14/2016   HCT 30.1* 01/14/2016   MCV 85.8 01/14/2016   PLT 271 01/14/2016    Recent Labs  01/11/16 1933  INR 1.24    Recent Labs Lab 01/11/16 1933  01/14/16 0516  NA 134*  < > 140  K 2.8*  < > 3.0*  CL 94*  < > 104  CO2 31  < > 24  BUN 24*  < > 32*  CREATININE 0.78  < > 1.06  CALCIUM 9.2  < > 8.6*  PROT 6.9  --   --   BILITOT 0.5  --   --   ALKPHOS 124  --   --   ALT 12*  --   --   AST 20  --    --   GLUCOSE 129*  < > 89  ALBUMIN 3.5  --   --   < > = values in this interval not displayed. MAGNESIUM  Date Value Ref Range Status  01/11/2016 1.7 1.7 - 2.4 mg/dL Final    Recent Labs  01/11/16 1933  TROPONINI <0.03    LIPASE  Date/Time Value Ref Range Status  01/11/2016 07:33 PM 31 11 - 51 U/L Final    Echo: pending.  ECG:  none  Radiology:  Dg Chest Port 1 View  01/13/2016  CLINICAL DATA:  R06.02 (ICD-10-CM) - SOB (shortness of breath)COPD (chronic obstructive pulmonary disease) ([place]) J44.9Smokes 1ppd/60 yrs EXAM: PORTABLE CHEST 1 VIEW COMPARISON:  Chest CT, 07/23/2015.  Chest radiograph, 02/06/2015. FINDINGS: There is new opacity at the right lung base when compared to the prior studies silhouetting the right hemidiaphragm. Mild opacities noted in the medial left lung base. Remainder of the lungs is clear. Left lung is hyperexpanded. No pneumothorax. Cardiac silhouette is normal in size. No mediastinal or hilar masses or convincing adenopathy. Bony thorax is demineralized but grossly intact. IMPRESSION: 1. New bilateral lung base opacity, greater on the right. This may be atelectasis, pneumonia or a combination. A central obstructing lesion is not excluded. Consider followup chest CT with contrast for further assessment. 2. No evidence of pulmonary edema. Electronically Signed   By: Lajean Manes M.D.   On: 01/13/2016 11:21    ASSESSMENT AND PLAN:    Principal Problem:   Abdominal pain Active Problems:   Atrial fibrillation with RVR (HCC)   Ischemia of small intestine (HCC)   COPD (chronic obstructive pulmonary disease) (HCC)   Hypertension   High cholesterol   Hypokalemia   Renal calculi  Steve Gomez is a 75 y.o. male with a history of AAA s/p repair, HLD, HTN and COPD/tobacco abuse who presented to [facility] ED on 01/12/16 with abdominal pain with N/V. CT findings concerning for portal venous gas and found to be in afib with RVR and he was transferred to [facility] for further  evaluation and treatment.   New onset afib with RVR: Found to be in afib with RVR and started on IV heparin and cardizem gtt. Cardizem later discontinued due to hypotension. Now only on labetelol 100mg  BID (home dose 300mg  BID) and digoxin 0.125mg  daily. Rate not well controlled and BPs still soft. Will stop labetelol and start inderal 10mg  TID and continue digoxin. -- CHADSVASC score of at least 3 (HTN, age, vasc dx (AAA and atherosclerosis of celiac and SMA on CT scan)). Will need oral anticoagulation. Could convert him to NOAC  -- Will order 2D ECHO to evaluate cardiac structure and function and TSH. Likely precipitated by acute illness. -- Plan for rate control. Consider outpatient DCCV if he remains in afib and has been compliant with anticoagulation  AAA s/p repair: over 20 years ago in [place]  HTN: BP currently soft.   HLD: continue statin.   Celiac and SMA atherosclerosis: non obstructive  Leukocytosis: down to 26.8 today ?urinary source, possible hydro on CT from kidney stones, has coude catheter. On Zosyn Day #3. Per IM, surgery and urology  Acute urinary retention & B/L renal calculi with fullness of B/L renal collecting systems: urology following, foley to stay in until getting up and moving around more  Tobacco abuse: counseled on the importance of quitting. Continue nicotine patch.   Signed: Angelena Form, PA-C 01/14/2016 1:35 PM  Pager [phone]  Co-Sign MD  Patient examined chart reviewed. Mild abdominal pain. Loud MR murmur on exam Afib of unknown duration. Continue heparin. Would not start oral anticoagulation Until abdominal pathology further defined. Rate control limited by low BP. Continue Digoxin and start low dose inderal 10 tid.  Echo has been ordered to see EF LA size And degree of valve disease. Continue antibiotics impressive leukocytosis but source Not clear  Jenkins Rouge

## 2016-01-15 DIAGNOSIS — N320 Bladder-neck obstruction: Principal | ICD-10-CM

## 2016-01-15 DIAGNOSIS — R1032 Left lower quadrant pain: Secondary | ICD-10-CM

## 2016-01-15 DIAGNOSIS — R0602 Shortness of breath: Secondary | ICD-10-CM

## 2016-01-15 DIAGNOSIS — I4891 Unspecified atrial fibrillation: Secondary | ICD-10-CM | POA: Diagnosis present

## 2016-01-15 DIAGNOSIS — D72829 Elevated white blood cell count, unspecified: Secondary | ICD-10-CM

## 2016-01-15 LAB — HEPARIN LEVEL (UNFRACTIONATED)
HEPARIN UNFRACTIONATED: 0.43 [IU]/mL (ref 0.30–0.70)
Heparin Unfractionated: 0.27 IU/mL — ABNORMAL LOW (ref 0.30–0.70)

## 2016-01-15 LAB — CBC
HEMATOCRIT: 28.2 % — AB (ref 39.0–52.0)
Hemoglobin: 9.1 g/dL — ABNORMAL LOW (ref 13.0–17.0)
MCH: 27.6 pg (ref 26.0–34.0)
MCHC: 32.3 g/dL (ref 30.0–36.0)
MCV: 85.5 fL (ref 78.0–100.0)
PLATELETS: 253 10*3/uL (ref 150–400)
RBC: 3.3 MIL/uL — ABNORMAL LOW (ref 4.22–5.81)
RDW: 14.7 % (ref 11.5–15.5)
WBC: 18.3 10*3/uL — ABNORMAL HIGH (ref 4.0–10.5)

## 2016-01-15 LAB — BASIC METABOLIC PANEL
Anion gap: 9 (ref 5–15)
BUN: 21 mg/dL — AB (ref 6–20)
CALCIUM: 8.3 mg/dL — AB (ref 8.9–10.3)
CHLORIDE: 103 mmol/L (ref 101–111)
CO2: 26 mmol/L (ref 22–32)
CREATININE: 0.7 mg/dL (ref 0.61–1.24)
GFR calc non Af Amer: >60 mL/min (ref >60)
Glucose, Bld: 86 mg/dL (ref 65–99)
Potassium: 3 mmol/L — ABNORMAL LOW (ref 3.5–5.1)
SODIUM: 138 mmol/L (ref 135–145)

## 2016-01-15 LAB — LIPID PANEL
Cholesterol: 77 mg/dL (ref 0–200)
HDL: 20 mg/dL — ABNORMAL LOW (ref >40)
LDL CALC: 36 mg/dL (ref 0–99)
Total CHOL/HDL Ratio: 3.9 RATIO
Triglycerides: 107 mg/dL (ref <150)
VLDL: 21 mg/dL (ref 0–40)

## 2016-01-15 LAB — MAGNESIUM: Magnesium: 1.6 mg/dL — ABNORMAL LOW (ref 1.7–2.4)

## 2016-01-15 LAB — C DIFFICILE QUICK SCREEN W PCR REFLEX
C DIFFICILE (CDIFF) INTERP: NEGATIVE
C Diff antigen: NEGATIVE
C Diff toxin: NEGATIVE

## 2016-01-15 MED ORDER — PROPRANOLOL HCL 20 MG PO TABS
20.0000 mg | ORAL_TABLET | Freq: Three times a day (TID) | ORAL | Status: DC
  Administered 2016-01-15 – 2016-01-17 (×8): 20 mg via ORAL
  Filled 2016-01-15 (×10): qty 1

## 2016-01-15 MED ORDER — POTASSIUM CHLORIDE CRYS ER 20 MEQ PO TBCR
40.0000 meq | EXTENDED_RELEASE_TABLET | Freq: Three times a day (TID) | ORAL | Status: AC
  Administered 2016-01-15 (×3): 40 meq via ORAL
  Filled 2016-01-15 (×3): qty 2

## 2016-01-15 MED ORDER — POTASSIUM CHLORIDE 10 MEQ/100ML IV SOLN: 10.0000 meq | INTRAVENOUS | Status: DC

## 2016-01-15 MED ORDER — MAGNESIUM SULFATE 2 GM/50ML IV SOLN
2.0000 g | Freq: Once | INTRAVENOUS | Status: AC
  Administered 2016-01-15: 2 g via INTRAVENOUS
  Filled 2016-01-15: qty 50

## 2016-01-15 MED ORDER — MAGNESIUM SULFATE 4 GM/100ML IV SOLN: 4.0000 g | Freq: Once | INTRAVENOUS | Status: DC

## 2016-01-15 NOTE — Progress Notes (Signed)
Subjective: NAE overnight.   Tolerated diet this morning.   Endorses passage of flatus. States he is feeling much better.  Objective: Vital signs in last 24 hours: Temp:  [98 F (36.7 C)-99 F (37.2 C)] 98.5 F (36.9 C) (05/18 0753) Pulse Rate:  [66-114] 112 (05/18 0800) Resp:  [19-27] 23 (05/18 0800) BP: (90-119)/(60-82) 103/67 mmHg (05/18 0800) SpO2:  [91 %-99 %] 93 % (05/18 0800) Weight:  [52.663 kg (116 lb 1.6 oz)] 52.663 kg (116 lb 1.6 oz) (05/18 0700) Last BM Date: 01/14/16  Intake/Output from previous day: 05/17 0701 - 05/18 0700 In: 1020.9 [P.O.:480; I.V.:340.9; IV Piggyback:200] Out: 1750 [Urine:1750] Intake/Output this shift:    General appearance: alert, cooperative, appears stated age, no distress and Laying comfortably in bed with wife at bedside. Resp: nonlabored respirations.  bilateral chest wall expansion. GI: Soft, nontender.  Nondistended.    Lab Results:   Recent Labs  01/14/16 0516 01/15/16 0223  WBC 26.8* 18.3*  HGB 9.7* 9.1*  HCT 30.1* 28.2*  PLT 271 253   BMET  Recent Labs  01/14/16 0516 01/15/16 0223  NA 140 138  K 3.0* 3.0*  CL 104 103  CO2 24 26  GLUCOSE 89 86  BUN 32* 21*  CREATININE 1.06 0.70  CALCIUM 8.6* 8.3*   PT/INR No results for input(s): LABPROT, INR in the last 72 hours. ABG No results for input(s): PHART, HCO3 in the last 72 hours.  Invalid input(s): PCO2, PO2  Studies/Results: Dg Chest Port 1 View  01/13/2016  CLINICAL DATA:  R06.02 (ICD-10-CM) - SOB (shortness of breath)COPD (chronic obstructive pulmonary disease) (HCC) J44.9Smokes 1ppd/60 yrs EXAM: PORTABLE CHEST 1 VIEW COMPARISON:  Chest CT, 07/23/2015.  Chest radiograph, 02/06/2015. FINDINGS: There is new opacity at the right lung base when compared to the prior studies silhouetting the right hemidiaphragm. Mild opacities noted in the medial left lung base. Remainder of the lungs is clear. Left lung is hyperexpanded. No pneumothorax. Cardiac silhouette is  normal in size. No mediastinal or hilar masses or convincing adenopathy. Bony thorax is demineralized but grossly intact. IMPRESSION: 1. New bilateral lung base opacity, greater on the right. This may be atelectasis, pneumonia or a combination. A central obstructing lesion is not excluded. Consider followup chest CT with contrast for further assessment. 2. No evidence of pulmonary edema. Electronically Signed   By: Lajean Manes M.D.   On: 01/13/2016 11:21    Anti-infectives: Anti-infectives    Start     Dose/Rate Route Frequency Ordered Stop   01/12/16 1000  piperacillin-tazobactam (ZOSYN) IVPB 3.375 g     3.375 g 12.5 mL/hr over 240 Minutes Intravenous Every 8 hours 01/12/16 0857        Assessment/Plan: s/p * No surgery found * 75 yo M with CT findings concerning for portal venous gas.  -The patients pain has much improved since admission. He does not have an acute abdomen and his vitals are stable. Continue conservative management. -IVF, pain control, antiemetics -SCD's and on heparin drip -Ambulate and IS  -Tolerating fulls.  Will advance to regular diet. Duodenal diverticula without inflammation Celiac and SMA athersclerosis - non obstructive Leukocytosis - Continues to decrease, now 18.  On zosyn day 4 B/L lung base opacity ?atelectasis vs pneumonia - will leave the treatment of this up to medicine Acute urinary retention & B/L renal calculi with fullness of B/L renal collecting systems - urology following, foley to stay in until getting up and moving around more AFIB with RVR - may  need cards Hypokalemia - supplemented again today HTN/HLD H/o hernia repair with mesh, cholecystectomy, and AAA repair  Deconditioning - PT eval  Will sign off at this time.  Can transition IV meds to oral at primary teams preference.  Please do not hesitate to call us should his pain or abdominal symptoms return.  LOS: 4 days    Reino Kent 01/15/2016

## 2016-01-15 NOTE — Progress Notes (Signed)
ANTICOAGULATION CONSULT NOTE - Follow Up Consult  Pharmacy Consult for heparin Indication: atrial fibrillation  Labs:  Recent Labs  01/12/16 0614  01/13/16 0543 01/13/16 0814  01/14/16 0516 01/14/16 1230 01/14/16 2054 01/15/16 0223  HGB 10.2*  --  10.1*  --   --  9.7*  --   --  9.1*  HCT 32.1*  --  31.6*  --   --  30.1*  --   --  28.2*  PLT 300  --  274  --   --  271  --   --  253  HEPARINUNFRC  --   < >  --   --   < > 0.29* 0.22* 0.37 0.27*  CREATININE 0.65  --   --  1.17  --  1.06  --   --   --   < > = values in this interval not displayed.   Assessment: 75yo male now slightly subtherapeutic on heparin after one level at low end of goal.  Goal of Therapy:  Heparin level 0.3-0.7 units/ml   Plan:  Will increase heparin gtt by 2 units/kg/hr to 1550 units/hr and check level in [place], PharmD, BCPS  01/15/2016,3:41 AM

## 2016-01-15 NOTE — Progress Notes (Signed)
Pharmacy Antibiotic Note  Steve Gomez is a 75 y.o. male admitted on 01/11/2016 with possible abd infection.  Pharmacy has been consulted for zosyn dosing.  Plan: Zosyn 3.375g IV q8h (4 hour infusion).  Please consider d/c zosyn with patient improvement - can de-escalate to unasyn or complete a week therapy with augmentin  Height: 5\' 4"  (162.6 cm) Weight: 116 lb 1.6 oz (52.663 kg) IBW/kg (Calculated) : 59.2  Temp (24hrs), Avg:98.5 F (36.9 C), Min:98 F (36.7 C), Max:99 F (37.2 C)   Recent Labs Lab 01/11/16 1933 01/11/16 2310 01/12/16 0614 01/13/16 0543 01/13/16 0814 01/14/16 0516 01/15/16 0223  WBC 19.8*  --  26.7* 37.1*  --  26.8* 18.3*  CREATININE 0.78  --  0.65  --  1.17 1.06 0.70  LATICACIDVEN  --  1.3  --   --   --   --   --     Estimated Creatinine Clearance: 60.4 mL/min (by C-G formula based on Cr of 0.7).    No Known Allergies  Antimicrobials this admission: Zosyn 5/15>>  Dose adjustments this admission: None  Microbiology results: MRSA PCR neg  Levester Fresh, PharmD, BCPS, [facility] Clinical Pharmacist Pager [phone] 01/15/2016 12:36 PM

## 2016-01-15 NOTE — Consult Note (Signed)
   [facility] CM Inpatient Consult   01/15/2016  Jaymere Vanblaricom March 01, 1941 [id number]   Patient screened for [facility] program. [place] Majestic to bedside to speak with patient and sister in law, with patient's permission, to explain and offer [place] [employer] program services. Mr. Caliendo states he would like for his wife to look over information and he would have her call writer if she thinks he needs follow up from [place] [facility] program. [place] [facility] brochure and contact information left at bedside. Made inpatient RNCM aware.  Marthenia Rolling, MSN-Ed, RN,BSN [facility] Liaison [phone]

## 2016-01-15 NOTE — Progress Notes (Signed)
Patient ID: Steve Gomez, male   DOB: [birth year], 75 y.o.   MRN: [id number]    Subjective:  Denies SSCP, palpitations or Dyspnea Taking liquids  Objective:  Filed Vitals:   01/15/16 0200 01/15/16 0400 01/15/16 0600 01/15/16 0700  BP: 97/71 114/82 112/64   Pulse: 108 111 102   Temp:   98.4 F (36.9 C)   TempSrc:   Oral   Resp: 20 23 24    Height:      Weight:    52.663 kg (116 lb 1.6 oz)  SpO2: 99% 95% 95%     Intake/Output from previous day:  Intake/Output Summary (Last 24 hours) at 01/15/16 0737 Last data filed at 01/15/16 0646  Gross per 24 hour  Intake 1020.86 ml  Output   1750 ml  Net -729.14 ml    Physical Exam: Affect appropriate Chronically ill white male  HEENT: normal Neck supple with no adenopathy JVP normal no bruits no thyromegaly Lungs clear with no wheezing and good diaphragmatic motion Heart:  S1/S2 MR  murmur, no rub, gallop or click PMI normal Abdomen: benighn, BS positve, no tenderness, no AAA no bruit.  No HSM or HJR Distal pulses intact with no bruits No edema Neuro non-focal Skin warm and dry No muscular weakness   Lab Results: Basic Metabolic Panel:  Recent Labs  01/14/16 0516 01/15/16 0223  NA 140 138  K 3.0* 3.0*  CL 104 103  CO2 24 26  GLUCOSE 89 86  BUN 32* 21*  CREATININE 1.06 0.70  CALCIUM 8.6* 8.3*  MG  --  1.6*   CBC:  Recent Labs  01/14/16 0516 01/15/16 0223  WBC 26.8* 18.3*  HGB 9.7* 9.1*  HCT 30.1* 28.2*  MCV 85.8 85.5  PLT 271 253   Fasting Lipid Panel:  Recent Labs  01/15/16 0223  CHOL 77  HDL 20*  LDLCALC 36  TRIG 107  CHOLHDL 3.9   Thyroid Function Tests:  Recent Labs  01/14/16 1437  TSH 1.248    Imaging: Dg Chest Port 1 View  01/13/2016  CLINICAL DATA:  R06.02 (ICD-10-CM) - SOB (shortness of breath)COPD (chronic obstructive pulmonary disease) (HCC) J44.9Smokes 1ppd/60 yrs EXAM: PORTABLE CHEST 1 VIEW COMPARISON:  Chest CT, 07/23/2015.  Chest radiograph, 02/06/2015. FINDINGS: There  is new opacity at the right lung base when compared to the prior studies silhouetting the right hemidiaphragm. Mild opacities noted in the medial left lung base. Remainder of the lungs is clear. Left lung is hyperexpanded. No pneumothorax. Cardiac silhouette is normal in size. No mediastinal or hilar masses or convincing adenopathy. Bony thorax is demineralized but grossly intact. IMPRESSION: 1. New bilateral lung base opacity, greater on the right. This may be atelectasis, pneumonia or a combination. A central obstructing lesion is not excluded. Consider followup chest CT with contrast for further assessment. 2. No evidence of pulmonary edema. Electronically Signed   By: Lajean Manes M.D.   On: 01/13/2016 11:21    Cardiac Studies:  ECG: rapid afib rate 120    Telemetry: afib rates 100-110  Echo:   Medications:   . digoxin  0.125 mg Oral Daily  . magnesium sulfate 1 - 4 g bolus IVPB  2 g Intravenous Once  . magnesium sulfate 1 - 4 g bolus IVPB  4 g Intravenous Once  . nicotine  14 mg Transdermal Daily  . piperacillin-tazobactam (ZOSYN)  IV  3.375 g Intravenous Q8H  . potassium chloride  10 mEq Intravenous Q1 Hr x 5  . potassium  chloride  40 mEq Oral TID  . propranolol  10 mg Oral TID  . sodium chloride flush  3 mL Intravenous Q12H     . heparin 1,550 Units/hr (01/15/16 0341)    Assessment/Plan:  Steve Gomez is a 75 y.o. male with a history of AAA s/p repair, HLD, HTN and COPD/tobacco abuse who presented to [facility] ED on 01/12/16 with abdominal pain with N/V. CT findings concerning for portal venous gas and found to be in afib with RVR and he was transferred to [facility] for further evaluation and treatment.   New onset afib with RVR: Increase inderal for rate control continue heparin oral anticoagulation once abdominal pathology defined .  Echo with normal EF moderate MR  AAA s/p repair: over 20 years ago in [place]  HTN: BP currently soft.   HLD: continue statin.   Celiac and SMA  atherosclerosis: non obstructive  Leukocytosis: down to 26.8 today ?urinary source, possible hydro on CT from kidney stones, has coude catheter. On Zosyn Day #3. Per IM, surgery and urology  Acute urinary retention & B/L renal calculi with fullness of B/L renal collecting systems: urology following, foley to stay in until getting up and moving around more  Tobacco abuse: counseled on the importance of quitting. Continue nicotine patch.   Jenkins Rouge 01/15/2016, 7:37 AM

## 2016-01-15 NOTE — Progress Notes (Signed)
ANTICOAGULATION CONSULT NOTE  Pharmacy Consult for heparin Indication: atrial fibrillation  No Known Allergies  Patient Measurements: Height: 5\' 4"  (162.6 cm) Weight: 116 lb 1.6 oz (52.663 kg) IBW/kg (Calculated) : 59.2 HEPARIN DW (KG): 53.2   Vital Signs: Temp: 98.4 F (36.9 C) (05/18 1130) Temp Source: Oral (05/18 1130) BP: 103/67 mmHg (05/18 0800) Pulse Rate: 112 (05/18 0800)  Labs:  Recent Labs  01/13/16 0543 01/13/16 0814  01/14/16 0516  01/14/16 2054 01/15/16 0223 01/15/16 1148  HGB 10.1*  --   --  9.7*  --   --  9.1*  --   HCT 31.6*  --   --  30.1*  --   --  28.2*  --   PLT 274  --   --  271  --   --  253  --   HEPARINUNFRC  --   --   < > 0.29*  < > 0.37 0.27* 0.43  CREATININE  --  1.17  --  1.06  --   --  0.70  --   < > = values in this interval not displayed. Estimated Creatinine Clearance: 60.4 mL/min (by C-G formula based on Cr of 0.7).  Assessment: 75 yo male transferred from [facility] for abd pain x 2 weeks  PMH: COPD, HTN, HLD  Anticoagulation: new onset afib? On iv hep gtt. On 1550 units/hr level 0.43  CV: still in afib to add digoxin for rate control  Gastrointestinal / Nutrition: ct abd shows gas in left lobe of liver; CCS consulted - conservative management - potential WBC increased d/t obstructing kidney stones?  Nephrology: SCr 0.7  Heme: H&H 9.1/28.2, Plt 253  Goal of Therapy:  Heparin level 0.3-0.7 units/ml   Plan:  Continue heparin 1550 units/hr Daily HL, CBC Monitor s/sx of bleeding, renal fx F/U plans for [facility]  Levester Fresh, PharmD, BCPS, [facility] Clinical Pharmacist Pager [phone] 01/15/2016 12:35 PM

## 2016-01-15 NOTE — Progress Notes (Signed)
Progress Note    Steve Gomez  [id number] DOB: October 25, 1940  DOA: 01/11/2016 PCP: Alonza Bogus, MD    Brief Narrative:   Steve Gomez is an 75 y.o. male with a PMH of COPD, hypertension and hyperlipidemia who was admitted 01/11/16 with a chief complaint of a two-week history of right upper quadrant pain and nausea/vomiting over the past 24 hours. Upon initial evaluation in the ED, he was found to be in atrial fibrillation with RVR (heart rate in the 130s). CT scan of abdomen/pelvis showed gas in the left lobe of the liver concerning for portal venous gas. Gen. surgery subsequently consulted.  Assessment/Plan:   Principal Problem:   Abdominal pain, rule out ischemia of small intestine/leukocytosis Evaluated by general surgery with no indication for operative management. Treating conservatively with IV fluids, pain control and antinausea medications. Also on empiric Zosyn which we will continue given very high white blood cell count (Now trending down). Diet advanced to clear liquids. Multiple possible etiologies for abdominal pain including urinary retention, gastritis, mesenteric/intestinal ischemia, nephrolithiasis. At this point, would monitor closely. Lactic acid not elevated, which is reassuring. Currently on a full liquid diet but has been advanced to heart healthy.  Abdominal pain much better per patient.  Active Problems:   Atrial fibrillation with RVR ([place]) Initially treated with Cardizem drip which was discontinued 01/13/16 due to hypotension. Currently on digoxin and low-dose propranolol per cardiology recommendations. Discussed case with Dr. Johnsie Cancel of cardiology who recommended further evaluation with 2-D echo and oral anticoagulation. He has a CHADSVASC score of at least 3 (HTN, age, vasc dx (AAA and atherosclerosis of celiac and SMA on CT scan). Heart rate suboptimally controlled. Inderal adjusted.    COPD (chronic obstructive pulmonary disease) ([place]) with ongoing  tobacco abuse Counseled regarding cessation. Continue nicotine patch.    Hypertension Blood pressures currently stable but remains on the soft side.     History of High cholesterol Lipid panel shows a cholesterol of 77 and an LDL of 36.    Hypokalemia/hypomagnesemia Both potassium and magnesium remained low. Continue to supplement.    Atherosclerosis/AAA Status post AAA repair over 20 years ago. Imaging shows celiac and SMA atherosclerosis.    Renal calculi/urinary retention Evaluated by urology. Coud catheter placed for urinary retention with recommendations to leave the catheter in until the patient can void/walk to the bathroom. Flomax cautiously recommended due to potential side effects of hypotension/dizziness. Monitor blood pressure another 24 hours before considering adding Flomax as he may need further up titration of propranolol for better rate control.  Family Communication/Anticipated D/C date and plan/Code Status   DVT prophylaxis: On IV heparin. Code Status: Full Code.  Family Communication: Sister-in-law updated at the bedside. Disposition Plan: Home with spouse when heart rate controlled and transitioned to oral anticoagulation. Likely will need another one and possibly 2 more days in the hospital.   Medical Consultants:    Cardiology  General Surgery  Procedures:   2D echo [id number]: Systolic function was normal. The estimated ejection fraction was in the range of 55% to 60%. Wall motion was normal; there were no regional wall motion abnormalities.  Anti-Infectives:   Zosyn 01/12/16--->  Subjective:   [name] denies chest pain, dyspnea, and nausea/vomiting. Reports occasional lower abdominal pain, but this has mostly resolved. Describes it as dull.   Objective:    Filed Vitals:   01/15/16 0200 01/15/16 0400 01/15/16 0600 01/15/16 0700  BP: 97/71 114/82 112/64   Pulse: 108 111 102  Temp:   98.4 F (36.9 C)   TempSrc:   Oral   Resp: 20 23 24     Height:      Weight:    52.663 kg (116 lb 1.6 oz)  SpO2: 99% 95% 95%     Intake/Output Summary (Last 24 hours) at 01/15/16 0726 Last data filed at 01/15/16 0646  Gross per 24 hour  Intake 1020.86 ml  Output   1750 ml  Net -729.14 ml   Filed Weights   01/13/16 0353 01/14/16 0600 01/15/16 0700  Weight: 52.527 kg (115 lb 12.8 oz) 52.254 kg (115 lb 3.2 oz) 52.663 kg (116 lb 1.6 oz)    Exam: General exam: Appears calm and comfortable. Frail-appearing. Respiratory system: Clear to auscultation. Respiratory effort normal. Cardiovascular system: S1 & S2 heard, rhythm irregularly irregular. No JVD,  rubs, gallops or clicks. II/VI systolic murmur. Gastrointestinal system: Abdomen is nondistended, soft and nontender. No organomegaly or masses felt. Normal bowel sounds heard. Central nervous system: Alert and oriented. No focal neurological deficits. Extremities: No clubbing, edema, or cyanosis. Skin: No rashes, lesions or ulcers Psychiatry: Judgement and insight appear normal. Mood & affect appropriate.   Data Reviewed:   I have personally reviewed following labs and imaging studies:  Labs: Basic Metabolic Panel:  Recent Labs Lab 01/11/16 1933 01/12/16 0614 01/13/16 0814 01/14/16 0516 01/15/16 0223  NA 134* 137 139 140 138  K 2.8* 3.4* 3.1* 3.0* 3.0*  CL 94* 102 99* 104 103  CO2 31 26 26 24 26   GLUCOSE 129* 104* 106* 89 86  BUN 24* 13 26* 32* 21*  CREATININE 0.78 0.65 1.17 1.06 0.70  CALCIUM 9.2 8.5* 9.0 8.6* 8.3*  MG 1.7  --   --   --  1.6*   GFR Estimated Creatinine Clearance: 60.4 mL/min (by C-G formula based on Cr of 0.7). Liver Function Tests:  Recent Labs Lab 01/11/16 1933  AST 20  ALT 12*  ALKPHOS 124  BILITOT 0.5  PROT 6.9  ALBUMIN 3.5    Recent Labs Lab 01/11/16 1933  LIPASE 31   Coagulation profile  Recent Labs Lab 01/11/16 1933  INR 1.24    CBC:  Recent Labs Lab 01/11/16 1933 01/12/16 0614 01/13/16 0543 01/14/16 0516  01/15/16 0223  WBC 19.8* 26.7* 37.1* 26.8* 18.3*  NEUTROABS 16.7*  --   --   --   --   HGB 11.0* 10.2* 10.1* 9.7* 9.1*  HCT 32.4* 32.1* 31.6* 30.1* 28.2*  MCV 87.3 87.0 86.8 85.8 85.5  PLT 332 300 274 271 253   Cardiac Enzymes:  Recent Labs Lab 01/11/16 1933  TROPONINI <0.03   Sepsis Labs:  Recent Labs Lab 01/11/16 2310 01/12/16 0614 01/13/16 0543 01/14/16 0516 01/15/16 0223  WBC  --  26.7* 37.1* 26.8* 18.3*  LATICACIDVEN 1.3  --   --   --   --    Urine analysis:    Component Value Date/Time   COLORURINE YELLOW 01/13/2016 [place] 01/13/2016 1146   LABSPEC 1.019 01/13/2016 1146   PHURINE 6.0 01/13/2016 1146   GLUCOSEU NEGATIVE 01/13/2016 1146   HGBUR SMALL* 01/13/2016 1146   [place] 01/13/2016 [place] 01/13/2016 [place] 01/13/2016 1146   NITRITE NEGATIVE 01/13/2016 [place] 01/13/2016 1146   Microbiology Recent Results (from the past 240 hour(s))  MRSA PCR Screening     Status: None   Collection Time: 01/12/16  2:32 AM  Result Value  Ref Range Status   MRSA by PCR NEGATIVE NEGATIVE Final    Comment:        The GeneXpert MRSA Assay (FDA approved for NASAL specimens only), is one component of a comprehensive MRSA colonization surveillance program. It is not intended to diagnose MRSA infection nor to guide or monitor treatment for MRSA infections.     Radiology: Dg Chest Port 1 View  01/13/2016  CLINICAL DATA:  R06.02 (ICD-10-CM) - SOB (shortness of breath)COPD (chronic obstructive pulmonary disease) ([place]) J44.9Smokes 1ppd/60 yrs EXAM: PORTABLE CHEST 1 VIEW COMPARISON:  Chest CT, 07/23/2015.  Chest radiograph, 02/06/2015. FINDINGS: There is new opacity at the right lung base when compared to the prior studies silhouetting the right hemidiaphragm. Mild opacities noted in the medial left lung base. Remainder of the lungs is clear. Left lung is hyperexpanded. No pneumothorax.  Cardiac silhouette is normal in size. No mediastinal or hilar masses or convincing adenopathy. Bony thorax is demineralized but grossly intact. IMPRESSION: 1. New bilateral lung base opacity, greater on the right. This may be atelectasis, pneumonia or a combination. A central obstructing lesion is not excluded. Consider followup chest CT with contrast for further assessment. 2. No evidence of pulmonary edema. Electronically Signed   By: Lajean Manes M.D.   On: 01/13/2016 11:21    Medications:   . digoxin  0.125 mg Oral Daily  . magnesium sulfate 1 - 4 g bolus IVPB  2 g Intravenous Once  . nicotine  14 mg Transdermal Daily  . piperacillin-tazobactam (ZOSYN)  IV  3.375 g Intravenous Q8H  . potassium chloride  40 mEq Oral TID  . propranolol  10 mg Oral TID  . sodium chloride flush  3 mL Intravenous Q12H   Continuous Infusions: . heparin 1,550 Units/hr (01/15/16 0341)    Time spent: 25 minutes.    LOS: 4 days   [facility] Pager [phone]. If unable to reach me by pager, please call my cell phone at [phone].  *Please refer to [email or web address], password [email or web address] to get updated schedule on who will round on this patient, as hospitalists switch teams weekly. If 7PM-7AM, please contact night-coverage at [email or web address], password [email or web address] for any overnight needs.  01/15/2016, 7:26 AM

## 2016-01-15 NOTE — Progress Notes (Signed)
Physical Therapy Treatment Patient Details Name: Steve Gomez MRN: [id number] DOB: 05-May-1941 Today's Date: 01/15/2016    History of Present Illness Pt adm with abd pain of ?etiology and afib with rvr. Pt with acute urinary retention with catheter placed. PMH - AAA s/p repair, HTN and COPD    PT Comments    Patient progressing with tolerance to ambulation on RA.  HR max 128 without c/o or symptoms.  Family in room and able to provide assist as needed.  Improved balance as increased distance with ambulation.  Ending BP 100/61 and no c/o lightheadedness.  Will continue skilled acute level PT until d/c home with family support.  Follow Up Recommendations  No PT follow up     Equipment Recommendations  None recommended by PT    Recommendations for Other Services       Precautions / Restrictions Precautions Precautions: Fall    Mobility  Bed Mobility Overal bed mobility: Modified Independent                Transfers   Equipment used: None Transfers: Sit to/from Stand Sit to Stand: Supervision         General transfer comment: assist for lines and safety  Ambulation/Gait Ambulation/Gait assistance: Min guard Ambulation Distance (Feet): 250 Feet Assistive device: None Gait Pattern/deviations: Step-through pattern;Decreased stride length;Scissoring;Drifts right/left     General Gait Details: some minor LOB with cross steps initially and minguard for safety, then improved with increased distance without LOB; HR up to 128 with ambulation   Stairs            Wheelchair Mobility    Modified Rankin (Stroke Patients Only)       Balance Overall balance assessment: Needs assistance         Standing balance support: No upper extremity supported Standing balance-Leahy Scale: Fair Standing balance comment: static balance without UE support                    Cognition Arousal/Alertness: Awake/alert Behavior During Therapy: WFL for tasks  assessed/performed Overall Cognitive Status: Within Functional Limits for tasks assessed                      Exercises General Exercises - Lower Extremity Hip ABduction/ADduction: Strengthening;Both;10 reps;Standing Heel Raises: Strengthening;Both;10 reps;Standing Mini-Sqauts: Strengthening;Both;10 reps;Standing Other Exercises Other Exercises: hip extension strengthening both legs 10 reps in standing with UE support, cues and assist for technique    General Comments        Pertinent Vitals/Pain Pain Assessment: No/denies pain    Home Living                      Prior Function            PT Goals (current goals can now be found in the care plan section) Progress towards PT goals: Progressing toward goals    Frequency  Min 3X/week    PT Plan Current plan remains appropriate    Co-evaluation             End of Session Equipment Utilized During Treatment: Gait belt Activity Tolerance: No increased pain;Patient tolerated treatment well Patient left: with call bell/phone within reach;in bed;with family/visitor present     Time: 1347-1410 PT Time Calculation (min) (ACUTE ONLY): 23 min  Charges:  $Gait Training: 8-22 mins $Therapeutic Exercise: 8-22 mins  G CodesReginia Naas 01/15/2016, 3:17 PM  Magda Kiel, [place] 01/15/2016

## 2016-01-15 NOTE — Care Management Important Message (Signed)
Important Message  Patient Details  Name: Steve Gomez MRN: [id number] Date of Birth: [birth year]   Medicare Important Message Given:  Yes    Bethena Roys, RN 01/15/2016, 11:36 AM

## 2016-01-16 ENCOUNTER — Other Ambulatory Visit: Payer: Self-pay | Admitting: *Deleted

## 2016-01-16 ENCOUNTER — Encounter: Payer: Self-pay | Admitting: *Deleted

## 2016-01-16 DIAGNOSIS — R1012 Left upper quadrant pain: Secondary | ICD-10-CM

## 2016-01-16 DIAGNOSIS — R1084 Generalized abdominal pain: Secondary | ICD-10-CM

## 2016-01-16 LAB — BASIC METABOLIC PANEL
Anion gap: 8 (ref 5–15)
BUN: 15 mg/dL (ref 6–20)
CO2: 27 mmol/L (ref 22–32)
CREATININE: 0.77 mg/dL (ref 0.61–1.24)
Calcium: 8.3 mg/dL — ABNORMAL LOW (ref 8.9–10.3)
Chloride: 107 mmol/L (ref 101–111)
GFR calc Af Amer: >60 mL/min (ref >60)
GLUCOSE: 137 mg/dL — AB (ref 65–99)
POTASSIUM: 4 mmol/L (ref 3.5–5.1)
Sodium: 142 mmol/L (ref 135–145)

## 2016-01-16 LAB — CBC
HCT: 30 % — ABNORMAL LOW (ref 39.0–52.0)
HEMOGLOBIN: 9.3 g/dL — AB (ref 13.0–17.0)
MCH: 27.3 pg (ref 26.0–34.0)
MCHC: 31 g/dL (ref 30.0–36.0)
MCV: 88 fL (ref 78.0–100.0)
PLATELETS: 233 10*3/uL (ref 150–400)
RBC: 3.41 MIL/uL — ABNORMAL LOW (ref 4.22–5.81)
RDW: 15 % (ref 11.5–15.5)
WBC: 12.8 10*3/uL — AB (ref 4.0–10.5)

## 2016-01-16 LAB — HEPARIN LEVEL (UNFRACTIONATED): HEPARIN UNFRACTIONATED: 0.52 [IU]/mL (ref 0.30–0.70)

## 2016-01-16 MED ORDER — TAMSULOSIN HCL 0.4 MG PO CAPS
0.4000 mg | ORAL_CAPSULE | Freq: Every day | ORAL | Status: DC
  Administered 2016-01-16 – 2016-01-17 (×2): 0.4 mg via ORAL
  Filled 2016-01-16 (×2): qty 1

## 2016-01-16 MED ORDER — RIVAROXABAN 20 MG PO TABS
20.0000 mg | ORAL_TABLET | Freq: Every day | ORAL | Status: DC
  Administered 2016-01-16 – 2016-01-17 (×2): 20 mg via ORAL
  Filled 2016-01-16 (×2): qty 1

## 2016-01-16 NOTE — Consult Note (Signed)
   [facility] CM Inpatient Consult   01/16/2016  Trooper Langrehr 13-May-1941 [id number]   Received call from Mr. Vorwald wife indicating she is interested in [place] Management services for her husband. Explained [facility] Care Management program in depth and Mrs. Angst was agreeable. Went to bedside to obtain written consent. Explained that [place] Management will not interfere or replace any other services Mr. Bailin may have in place, including home health if he should have it. Explained that Mr. Silos will receive post hospital transition of care calls and will be evaluated for monthly home visits. Mrs. Geck asks that she be called for post transition of care calls and to be called for arrangements of monthly home visits if needed. Mr. Borowsky is in agreement with this plan. Izora Gala Clare's best contact number is [phone]. [facility] Care Management packet and contact information left at bedside.   Discussed primary for [facility] Care Management will be symptom and disease management for COPD. Both patient and wife deny having issues with medications or with transportation. Appreciative of visit. Will make inpatient RNCM aware that [place] Management will follow post discharge. Confirmed Primary Care MD is Dr. Luan Pulling. Will request for patient to be assigned to [place].  Marthenia Rolling, MSN-Ed, RN,BSN [facility] Liaison [phone]     .

## 2016-01-16 NOTE — Progress Notes (Signed)
SATURATION QUALIFICATIONS: (This note is used to comply with regulatory documentation for home oxygen)  Patient Saturations on Room Air at Rest = 97%  Patient Saturations on Room Air while Ambulating = 84%  Patient Saturations on 2 Liters of oxygen while Ambulating = 98%  Please briefly explain why patient needs home oxygen:

## 2016-01-16 NOTE — Progress Notes (Signed)
Patient ID: Steve Gomez, male   DOB: Apr 07, 1941, 75 y.o.   MRN: [id number]    Subjective:  Denies SSCP, palpitations or Dyspnea Taking liquids and a bit of solids   Objective:  Filed Vitals:   01/15/16 1527 01/15/16 1600 01/15/16 2015 01/16/16 0524  BP: 105/78 105/81 102/58   Pulse: 95 81 109   Temp:   98.4 F (36.9 C)   TempSrc:   Oral   Resp:  25 24   Height:      Weight:    53 kg (116 lb 13.5 oz)  SpO2:  97% 95%     Intake/Output from previous day:  Intake/Output Summary (Last 24 hours) at 01/16/16 0725 Last data filed at 01/16/16 0343  Gross per 24 hour  Intake 679.62 ml  Output   1050 ml  Net -370.38 ml    Physical Exam: Affect appropriate Chronically ill white male  HEENT: normal Neck supple with no adenopathy JVP normal no bruits no thyromegaly Lungs clear with no wheezing and good diaphragmatic motion Heart:  S1/S2 MR  murmur, no rub, gallop or click PMI normal Abdomen: benighn, BS positve, no tenderness, no AAA no bruit.  No HSM or HJR Distal pulses intact with no bruits No edema Neuro non-focal Skin warm and dry No muscular weakness   Lab Results: Basic Metabolic Panel:  Recent Labs  01/14/16 0516 01/15/16 0223  NA 140 138  K 3.0* 3.0*  CL 104 103  CO2 24 26  GLUCOSE 89 86  BUN 32* 21*  CREATININE 1.06 0.70  CALCIUM 8.6* 8.3*  MG  --  1.6*   CBC:  Recent Labs  01/14/16 0516 01/15/16 0223  WBC 26.8* 18.3*  HGB 9.7* 9.1*  HCT 30.1* 28.2*  MCV 85.8 85.5  PLT 271 253   Fasting Lipid Panel:  Recent Labs  01/15/16 0223  CHOL 77  HDL 20*  LDLCALC 36  TRIG 107  CHOLHDL 3.9   Thyroid Function Tests:  Recent Labs  01/14/16 1437  TSH 1.248    Imaging: No results found.  Cardiac Studies:  ECG: rapid afib rate 120    Telemetry: afib rates 100-110  Echo:   Medications:   . digoxin  0.125 mg Oral Daily  . nicotine  14 mg Transdermal Daily  . piperacillin-tazobactam (ZOSYN)  IV  3.375 g Intravenous Q8H  .  propranolol  20 mg Oral TID  . sodium chloride flush  3 mL Intravenous Q12H     . heparin 1,550 Units/hr (01/15/16 1736)    Assessment/Plan:  Steve Gomez is a 75 y.o. male with a history of AAA s/p repair, HLD, HTN and COPD/tobacco abuse who presented to [facility] ED on 01/12/16 with abdominal pain with N/V. CT findings concerning for portal venous gas and found to be in afib with RVR and he was transferred to [facility] for further evaluation and treatment.   New onset afib with RVR:  Rate control better with higher dose inderal   continue heparin ? Start oral anticoagulation today as no surgery seems likely .  Echo with normal EF moderate MR  AAA s/p repair: over 20 years ago in [place]  HTN: BP currently soft.   HLD: continue statin.   Celiac and SMA atherosclerosis: non obstructive  Leukocytosis:  Lab Results  Component Value Date   WBC 18.3* 01/15/2016    ?urinary source, possible hydro on CT from kidney stones, has coude catheter. On Zosyn Day #4 . Per IM, surgery and urology  Acute urinary retention & B/L renal calculi with fullness of B/L renal collecting systems: urology following, foley to stay in until getting up and moving around more  Tobacco abuse: counseled on the importance of quitting. Continue nicotine patch.   Jenkins Rouge 01/16/2016, 7:25 AM

## 2016-01-16 NOTE — Discharge Instructions (Addendum)
Information on my medicine - XARELTO (Rivaroxaban)  This medication education was reviewed with me or my healthcare representative as part of my discharge preparation.    Why was Xarelto prescribed for you? Xarelto was prescribed for you to reduce the risk of a blood clot forming that can cause a stroke if you have a medical condition called atrial fibrillation (a type of irregular heartbeat).  What do you need to know about xarelto ? Take your Xarelto ONCE DAILY at the same time every day with your evening meal. If you have difficulty swallowing the tablet whole, you may crush it and mix in applesauce just prior to taking your dose.  Take Xarelto exactly as prescribed by your doctor and DO NOT stop taking Xarelto without talking to the doctor who prescribed the medication.  Stopping without other stroke prevention medication to take the place of Xarelto may increase your risk of developing a clot that causes a stroke.  Refill your prescription before you run out.  After discharge, you should have regular check-up appointments with your healthcare provider that is prescribing your Xarelto.  In the future your dose may need to be changed if your kidney function or weight changes by a significant amount.  What do you do if you miss a dose? If you are taking Xarelto ONCE DAILY and you miss a dose, take it as soon as you remember on the same day then continue your regularly scheduled once daily regimen the next day. Do not take two doses of Xarelto at the same time or on the same day.   Important Safety Information A possible side effect of Xarelto is bleeding. You should call your healthcare provider right away if you experience any of the following: ? Bleeding from an injury or your nose that does not stop. ? Unusual colored urine (red or dark brown) or unusual colored stools (red or black). ? Unusual bruising for unknown reasons. ? A serious fall or if you hit your head (even if  there is no bleeding).  Some medicines may interact with Xarelto and might increase your risk of bleeding while on Xarelto. To help avoid this, consult your healthcare provider or pharmacist prior to using any new prescription or non-prescription medications, including herbals, vitamins, non-steroidal anti-inflammatory drugs (NSAIDs) and supplements.  This website has more information on Xarelto: [email or web address]  Community-Acquired Pneumonia, Adult    Pneumonia is an infection of the lungs. There are different types of pneumonia. One type can develop while a person is in a hospital. A different type, called community-acquired pneumonia, develops in people who are not, or have not recently been, in the hospital or other health care facility.  CAUSES  Pneumonia may be caused by bacteria, viruses, or funguses. Community-acquired pneumonia is often caused by Streptococcus pneumonia bacteria. These bacteria are often passed from one person to another by breathing in droplets from the cough or sneeze of an infected person.  RISK FACTORS  The condition is more likely to develop in:  People who have chronic diseases, such as chronic obstructive pulmonary disease (COPD), asthma, congestive heart failure, cystic fibrosis, diabetes, or kidney disease.  People who have early-stage or late-stage HIV.  People who have sickle cell disease.  People who have had their spleen removed (splenectomy).  People who have poor dental hygiene.  People who have medical conditions that increase the risk of breathing in (aspirating) secretions their own mouth and nose.  People who have a weakened immune system (immunocompromised).  People  who smoke.  People who travel to areas where pneumonia-causing germs commonly exist.  People who are around animal habitats or animals that have pneumonia-causing germs, including birds, bats, rabbits, cats, and farm animals. SYMPTOMS  Symptoms of this condition include:  A dry  cough.  A wet (productive) cough.  Fever.  Sweating.  Chest pain, especially when breathing deeply or coughing.  Rapid breathing or difficulty breathing.  Shortness of breath.  Shaking chills.  Fatigue.  Muscle aches. DIAGNOSIS  Your health care provider will take a medical history and perform a physical exam. You may also have other tests, including:  Imaging studies of your chest, including X-rays.  Tests to check your blood oxygen level and other blood gases.  Other tests on blood, mucus (sputum), fluid around your lungs (pleural fluid), and urine. If your pneumonia is severe, other tests may be done to identify the specific cause of your illness.  TREATMENT  The type of treatment that you receive depends on many factors, such as the cause of your pneumonia, the medicines you take, and other medical conditions that you have. For most adults, treatment and recovery from pneumonia may occur at home. In some cases, treatment must happen in a hospital. Treatment may include:  Antibiotic medicines, if the pneumonia was caused by bacteria.  Antiviral medicines, if the pneumonia was caused by a virus.  Medicines that are given by mouth or through an IV tube.  Oxygen.  Respiratory therapy. Although rare, treating severe pneumonia may include:  Mechanical ventilation. This is done if you are not breathing well on your own and you cannot maintain a safe blood oxygen level.  Thoracentesis. This procedure removes fluid around one lung or both lungs to help you breathe better. HOME CARE INSTRUCTIONS  Take over-the-counter and prescription medicines only as told by your health care provider.  Only take cough medicine if you are losing sleep. Understand that cough medicine can prevent your body's natural ability to remove mucus from your lungs.  If you were prescribed an antibiotic medicine, take it as told by your health care provider. Do not stop taking the antibiotic even if you start to feel  better. Sleep in a semi-upright position at night. Try sleeping in a reclining chair, or place a few pillows under your head.  Do not use tobacco products, including cigarettes, chewing tobacco, and e-cigarettes. If you need help quitting, ask your health care provider.  Drink enough water to keep your urine clear or pale yellow. This will help to thin out mucus secretions in your lungs. PREVENTION  There are ways that you can decrease your risk of developing community-acquired pneumonia. Consider getting a pneumococcal vaccine if:  You are older than 75 years of age.  You are older than 75 years of age and are undergoing cancer treatment, have chronic lung disease, or have other medical conditions that affect your immune system. Ask your health care provider if this applies to you. There are different types and schedules of pneumococcal vaccines. Ask your health care provider which vaccination option is best for you.  You may also prevent community-acquired pneumonia if you take these actions:  Get an influenza vaccine every year. Ask your health care provider which type of influenza vaccine is best for you.  Go to the dentist on a regular basis.  Wash your hands often. Use hand sanitizer if soap and water are not available. SEEK MEDICAL CARE IF:  You have a fever.  You are losing sleep  because you cannot control your cough with cough medicine. SEEK IMMEDIATE MEDICAL CARE IF:  You have worsening shortness of breath.  You have increased chest pain.  Your sickness becomes worse, especially if you are an older adult or have a weakened immune system.  You cough up blood. This information is not intended to replace advice given to you by your health care provider. Make sure you discuss any questions you have with your health care provider.

## 2016-01-16 NOTE — Progress Notes (Signed)
Progress Note    Steve Gomez  [id number] DOB: 1941-08-30  DOA: 01/11/2016 PCP: Alonza Bogus, MD    Brief Narrative:   Steve Gomez is an 75 y.o. male with a PMH of COPD, hypertension and hyperlipidemia who was admitted 01/11/16 with a chief complaint of a two-week history of right upper quadrant pain and nausea/vomiting over the past 24 hours. Upon initial evaluation in the ED, he was found to be in atrial fibrillation with RVR (heart rate in the 130s). CT scan of abdomen/pelvis showed gas in the left lobe of the liver concerning for portal venous gas. Gen. surgery subsequently consulted.  Assessment/Plan:   Principal Problem:   Abdominal pain, rule out ischemia of small intestine/leukocytosis Evaluated by general surgery with no indication for operative management. Treating conservatively with IV fluids, pain control and antinausea medications. Also on empiric Zosyn which we will continue given very high white blood cell count (Now trending down). Diet advanced to clear liquids. Multiple possible etiologies for abdominal pain including urinary retention, gastritis, mesenteric/intestinal ischemia, nephrolithiasis. At this point, would monitor closely. Lactic acid not elevated, which is reassuring. Tolerating diet and abdominal pain much better per patient. WBC normalizing.    Active Problems:   Atrial fibrillation with RVR (Steve Gomez) Initially treated with Cardizem drip which was discontinued 01/13/16 due to hypotension. Currently on digoxin and low-dose propranolol per cardiology recommendations. Discussed case with Dr. Johnsie Cancel of cardiology who recommended further evaluation with 2-D echo and oral anticoagulation. He has a CHADSVASC score of at least 3 (HTN, age, vasc dx (AAA and atherosclerosis of celiac and SMA on CT scan). Heart rate improving.  D/C heparin and start Xarelto in anticipation of D/C home tomorrow.    COPD (chronic obstructive pulmonary disease) ([place]) with ongoing  tobacco abuse Counseled regarding cessation. Continue nicotine patch.    Hypertension Blood pressures currently stable but remains on the soft side.     History of High cholesterol Lipid panel shows a cholesterol of 77 and an LDL of 36.    Hypokalemia/hypomagnesemia Monitor and replace PRN.    Atherosclerosis/AAA Status post AAA repair over 20 years ago. Imaging shows celiac and SMA atherosclerosis.    Renal calculi/urinary retention Evaluated by urology. Coud catheter placed for urinary retention with recommendations to leave the catheter in until the patient can void/walk to the bathroom. Flomax cautiously recommended due to potential side effects of hypotension/dizziness. Start Flomax.  Will try voiding trial tonight.  Family Communication/Anticipated D/C date and plan/Code Status   DVT prophylaxis: On IV heparin. Transition to Xarelto. Code Status: Full Code.  Family Communication: Wife updated at the bedside. Disposition Plan: Home with spouse when heart rate controlled and transitioned to oral anticoagulation. Likely D/C 01/17/16 if patient able to void.   Medical Consultants:    Cardiology  General Surgery  Procedures:   2D echo [id number]: Systolic function was normal. The estimated ejection fraction was in the range of 55% to 60%. Wall motion was normal; there were no regional wall motion abnormalities.  Anti-Infectives:   Zosyn 01/12/16--->  Subjective:   [name] denies chest pain, dyspnea, and nausea/vomiting. Reports no further abdominal pain.   Objective:    Filed Vitals:   01/15/16 2015 01/16/16 0524 01/16/16 0939 01/16/16 1200  BP: 102/58  116/68 122/76  Pulse: 109  97 102  Temp: 98.4 F (36.9 C)     TempSrc: Oral     Resp: 24  23 20   Height:  Weight:  53 kg (116 lb 13.5 oz)    SpO2: 95%  98% 95%    Intake/Output Summary (Last 24 hours) at 01/16/16 1416 Last data filed at 01/16/16 0343  Gross per 24 hour  Intake     53 ml  Output    1050 ml  Net   -997 ml   Filed Weights   01/14/16 0600 01/15/16 0700 01/16/16 0524  Weight: 52.254 kg (115 lb 3.2 oz) 52.663 kg (116 lb 1.6 oz) 53 kg (116 lb 13.5 oz)    Exam: General exam: Appears calm and comfortable. Frail-appearing. Respiratory system: Clear to auscultation. Respiratory effort normal. Cardiovascular system: S1 & S2 heard, rhythm irregularly irregular. No JVD,  rubs, gallops or clicks. II/VI systolic murmur. Gastrointestinal system: Abdomen is nondistended, soft and nontender. No organomegaly or masses felt. Normal bowel sounds heard. Central nervous system: Alert and oriented. No focal neurological deficits. Extremities: No clubbing, edema, or cyanosis. Skin: No rashes, lesions or ulcers Psychiatry: Judgement and insight appear normal. Mood & affect appropriate.   Data Reviewed:   I have personally reviewed following labs and imaging studies:  Labs: Basic Metabolic Panel:  Recent Labs Lab 01/11/16 1933 01/12/16 0614 01/13/16 0814 01/14/16 0516 01/15/16 0223 01/16/16 0953  NA 134* 137 139 140 138 142  K 2.8* 3.4* 3.1* 3.0* 3.0* 4.0  CL 94* 102 99* 104 103 107  CO2 31 26 26 24 26 27   GLUCOSE 129* 104* 106* 89 86 137*  BUN 24* 13 26* 32* 21* 15  CREATININE 0.78 0.65 1.17 1.06 0.70 0.77  CALCIUM 9.2 8.5* 9.0 8.6* 8.3* 8.3*  MG 1.7  --   --   --  1.6*  --    GFR Estimated Creatinine Clearance: 60.7 mL/min (by C-G formula based on Cr of 0.77). Liver Function Tests:  Recent Labs Lab 01/11/16 1933  AST 20  ALT 12*  ALKPHOS 124  BILITOT 0.5  PROT 6.9  ALBUMIN 3.5    Recent Labs Lab 01/11/16 1933  LIPASE 31   Coagulation profile  Recent Labs Lab 01/11/16 1933  INR 1.24    CBC:  Recent Labs Lab 01/11/16 1933 01/12/16 0614 01/13/16 0543 01/14/16 0516 01/15/16 0223 01/16/16 0642  WBC 19.8* 26.7* 37.1* 26.8* 18.3* 12.8*  NEUTROABS 16.7*  --   --   --   --   --   HGB 11.0* 10.2* 10.1* 9.7* 9.1* 9.3*  HCT 32.4* 32.1* 31.6* 30.1*  28.2* 30.0*  MCV 87.3 87.0 86.8 85.8 85.5 88.0  PLT 332 300 274 271 253 233   Cardiac Enzymes:  Recent Labs Lab 01/11/16 1933  TROPONINI <0.03   Sepsis Labs:  Recent Labs Lab 01/11/16 2310  01/13/16 0543 01/14/16 0516 01/15/16 0223 01/16/16 0642  WBC  --   < > 37.1* 26.8* 18.3* 12.8*  LATICACIDVEN 1.3  --   --   --   --   --   < > = values in this interval not displayed. Urine analysis:    Component Value Date/Time   COLORURINE YELLOW 01/13/2016 [place] 01/13/2016 1146   LABSPEC 1.019 01/13/2016 1146   PHURINE 6.0 01/13/2016 1146   GLUCOSEU NEGATIVE 01/13/2016 1146   HGBUR SMALL* 01/13/2016 [place] 01/13/2016 [place] 01/13/2016 [place] 01/13/2016 1146   NITRITE NEGATIVE 01/13/2016 [place] 01/13/2016 1146   Microbiology Recent Results (from the past 240 hour(s))  MRSA PCR Screening  Status: None   Collection Time: 01/12/16  2:32 AM  Result Value Ref Range Status   MRSA by PCR NEGATIVE NEGATIVE Final    Comment:        The GeneXpert MRSA Assay (FDA approved for NASAL specimens only), is one component of a comprehensive MRSA colonization surveillance program. It is not intended to diagnose MRSA infection nor to guide or monitor treatment for MRSA infections.   C difficile quick scan w PCR reflex     Status: None   Collection Time: 01/15/16 10:02 AM  Result Value Ref Range Status   C Diff antigen NEGATIVE NEGATIVE Final   C Diff toxin NEGATIVE NEGATIVE Final   C Diff interpretation Negative for toxigenic C. difficile  Final    Radiology: No results found.  Medications:   . digoxin  0.125 mg Oral Daily  . nicotine  14 mg Transdermal Daily  . piperacillin-tazobactam (ZOSYN)  IV  3.375 g Intravenous Q8H  . propranolol  20 mg Oral TID  . rivaroxaban  20 mg Oral Q supper  . sodium chloride flush  3 mL Intravenous Q12H  . tamsulosin  0.4 mg Oral Daily    Continuous Infusions:    Time spent: 25 minutes.    LOS: 5 days   [facility] Pager [phone]. If unable to reach me by pager, please call my cell phone at [phone].  *Please refer to [email or web address], password [email or web address] to get updated schedule on who will round on this patient, as hospitalists switch teams weekly. If 7PM-7AM, please contact night-coverage at [email or web address], password [email or web address] for any overnight needs.  01/16/2016, 2:16 PM

## 2016-01-16 NOTE — Progress Notes (Signed)
[place] for heparin Indication: atrial fibrillation  No Known Allergies  Patient Measurements: Height: 5\' 4"  (162.6 cm) Weight: 116 lb 13.5 oz (53 kg) IBW/kg (Calculated) : 59.2 HEPARIN DW (KG): 53.2   Vital Signs: BP: 116/68 mmHg (05/19 0939) Pulse Rate: 97 (05/19 0939)  Labs:  Recent Labs  01/14/16 0516  01/15/16 0223 01/15/16 1148 01/16/16 0642  HGB 9.7*  --  9.1*  --  9.3*  HCT 30.1*  --  28.2*  --  30.0*  PLT 271  --  253  --  233  HEPARINUNFRC 0.29*  < > 0.27* 0.43 0.52  CREATININE 1.06  --  0.70  --   --   < > = values in this interval not displayed. Estimated Creatinine Clearance: 60.7 mL/min (by C-G formula based on Cr of 0.7).  Assessment: 75 yo male transferred from [facility] for abd pain x 2 weeks  PMH: COPD, HTN, HLD  Anticoagulation: new onset afib? On iv hep gtt. On 1550 units/hr level 0.52 - to xarelto today   Nephrology: SCr 0.7   Heme: H&H 9.3/30, Plt 233  Goal of Therapy:  Heparin level 0.3-0.7 units/ml   Plan:  D/C heparin  Xarelto 20 mg qd - will schedule 1st dose with lunch, then with dinner going forward  Monitor s/sx of bleeding, renal fx  Levester Fresh, PharmD, BCPS, [facility] Clinical Pharmacist Pager [phone] 01/16/2016 10:37 AM

## 2016-01-17 ENCOUNTER — Encounter (HOSPITAL_COMMUNITY): Payer: Self-pay | Admitting: Internal Medicine

## 2016-01-17 LAB — BASIC METABOLIC PANEL
Anion gap: 11 (ref 5–15)
BUN: 16 mg/dL (ref 6–20)
CHLORIDE: 101 mmol/L (ref 101–111)
CO2: 30 mmol/L (ref 22–32)
Calcium: 8.5 mg/dL — ABNORMAL LOW (ref 8.9–10.3)
Creatinine, Ser: 0.71 mg/dL (ref 0.61–1.24)
Glucose, Bld: 94 mg/dL (ref 65–99)
Potassium: 4.1 mmol/L (ref 3.5–5.1)
SODIUM: 142 mmol/L (ref 135–145)

## 2016-01-17 LAB — CBC
HEMATOCRIT: 28.8 % — AB (ref 39.0–52.0)
HEMOGLOBIN: 8.8 g/dL — AB (ref 13.0–17.0)
MCH: 26.7 pg (ref 26.0–34.0)
MCHC: 30.6 g/dL (ref 30.0–36.0)
MCV: 87.3 fL (ref 78.0–100.0)
Platelets: 232 10*3/uL (ref 150–400)
RBC: 3.3 MIL/uL — ABNORMAL LOW (ref 4.22–5.81)
RDW: 15.2 % (ref 11.5–15.5)
WBC: 12.3 10*3/uL — AB (ref 4.0–10.5)

## 2016-01-17 LAB — HEPARIN LEVEL (UNFRACTIONATED): HEPARIN UNFRACTIONATED: 1.22 [IU]/mL — AB (ref 0.30–0.70)

## 2016-01-17 MED ORDER — NICOTINE 14 MG/24HR TD PT24: 14.0000 mg | MEDICATED_PATCH | Freq: Every day | TRANSDERMAL | Status: DC

## 2016-01-17 MED ORDER — DIGOXIN 125 MCG PO TABS: 0.1250 mg | ORAL_TABLET | Freq: Every day | ORAL | Status: DC

## 2016-01-17 MED ORDER — TAMSULOSIN HCL 0.4 MG PO CAPS: 0.4000 mg | ORAL_CAPSULE | Freq: Every day | ORAL | Status: DC

## 2016-01-17 MED ORDER — CEFUROXIME AXETIL 500 MG PO TABS: 500.0000 mg | ORAL_TABLET | Freq: Two times a day (BID) | ORAL | Status: DC

## 2016-01-17 MED ORDER — RIVAROXABAN 20 MG PO TABS: 20.0000 mg | ORAL_TABLET | Freq: Every day | ORAL | Status: DC

## 2016-01-17 MED ORDER — PROPRANOLOL HCL 20 MG PO TABS: 20.0000 mg | ORAL_TABLET | Freq: Three times a day (TID) | ORAL | Status: DC

## 2016-01-17 NOTE — Progress Notes (Signed)
Foley catheter removed per Dr. Rockne Menghini verbal order

## 2016-01-17 NOTE — Progress Notes (Signed)
Discharge teaching and instructions reviewed. Pt has no further questions at this time. Foley in place. Discharging home via wife.

## 2016-01-17 NOTE — Care Management Important Message (Signed)
Important Message  Patient Details  Name: Steve Gomez MRN: [id number] Date of Birth: [birth year]   Medicare Important Message Given:  Yes    Steve Gomez [place] 01/17/2016, 11:11 AM

## 2016-01-17 NOTE — Discharge Summary (Signed)
Physician Discharge Summary  Steve Gomez [id number] DOB: [birth year] DOA: 01/11/2016  PCP: Alonza Bogus, MD  Admit date: 01/11/2016 Discharge date: 01/17/2016   Recommendations for Outpatient Follow-Up:   1. The patient has F/U with his PCP on 01/28/16. Instructed to keep his appointment. 2. Consider outpatient urology follow up if needed.   Discharge Diagnosis:   Principal Problems:    Abdominal pain, likely secondary to cystitis/UTI in the setting of bladder outlet obstruction    Atrial fibrillation with RVR ([place])  Active Problems:    R/O Ischemia of small intestine (HCC)    COPD (chronic obstructive pulmonary disease) (HCC)    Hypertension    High cholesterol    Hypokalemia    Renal calculi    Bladder outlet obstruction    Leukocytosis    New onset atrial fibrillation (HCC)    SOB (shortness of breath)    Nonobstructing bilateral renal calculi    Tobacco abuse   Discharge disposition:  Home.    Discharge Condition: Improved.  Diet recommendation: Low sodium, heart healthy.    History of Present Illness:   Steve Gomez is an 75 y.o. male with a PMH of COPD, hypertension and hyperlipidemia who was admitted 01/11/16 with a chief complaint of a two-week history of right upper quadrant pain and nausea/vomiting over the past 24 hours. Upon initial evaluation in the ED, he was found to be in atrial fibrillation with RVR (heart rate in the 130s). CT scan of abdomen/pelvis showed gas in the left lobe of the liver concerning for portal venous gas. Gen. surgery subsequently consulted.  Hospital Course by Problem:   Principal Problem:  Abdominal pain, rule out ischemia of small intestine/leukocytosis Evaluated by general surgery with no indication for operative management. Treated conservatively with IV fluids, pain control and antinausea medications as well as empiric Zosyn given very high white blood cell count (Consistently trended down). Multiple  possible etiologies for abdominal pain including urinary retention, gastritis, mesenteric/intestinal ischemia, nephrolithiasis, but felt to be most likely due to cystitis and UTI (cultures not sent on admission). Lactic acid not elevated, which made ischemic colitis less likely. Tolerated diet and abdominal pain resolved prior to D/C. WBC normalizing.Will send home on an additional 2 days of PO Ceftin.   Atrial fibrillation with RVR ([place]) Initially treated with Cardizem drip which was discontinued 01/13/16 due to hypotension. Currently on digoxin and propranolol per cardiology recommendations. Discussed case with Dr. Johnsie Cancel who recommended further evaluation with 2-D echo and oral anticoagulation. EF 55-60% with no regional wall motion abnormality. He has a CHADSVASC score of at least 3 (HTN, age, vasc dx (AAA and atherosclerosis of celiac and SMA on CT scan). Heart rate improved on Propranolol. Transitioned from heparin to oral Xarelto 01/16/16.  Active Problems:  COPD (chronic obstructive pulmonary disease) ([place]) with ongoing tobacco abuse Counseled regarding cessation. Continue nicotine patch.   Hypertension Blood pressures currently stable but remain on the soft side.    History of High cholesterol Lipid panel showed a cholesterol of 77 and an LDL of 36. Continue statin.   Hypokalemia/hypomagnesemia Monitored and replaced PRN.   Atherosclerosis/AAA Status post AAA repair over 20 years ago. Imaging shows celiac and SMA atherosclerosis.   Renal calculi/urinary retention Evaluated by urology. Coud catheter placed for urinary retention with recommendations to leave the catheter in until the patient can void/walk to the bathroom. Flomax cautiously recommended due to potential side effects of hypotension/dizziness, which was started 01/16/16. Outpatient urology F/U for recurrent problems.  Medical Consultants:    Cardiology  General Surgery  Urology   Discharge Exam:    Filed Vitals:   01/17/16 0552 01/17/16 0800  BP: 104/64 104/74  Pulse: 96   Temp: 98.8 F (37.1 C) 97.7 F (36.5 C)  Resp: 19    Filed Vitals:   01/16/16 1529 01/16/16 2031 01/17/16 0552 01/17/16 0800  BP: 107/62 103/67 104/64 104/74  Pulse:  98 96   Temp:  98.3 F (36.8 C) 98.8 F (37.1 C) 97.7 F (36.5 C)  TempSrc:  Oral  Oral  Resp:  20 19   Height:      Weight:   52.39 kg (115 lb 8 oz)   SpO2:  97% 97% 98%    Gen:  NAD Cardiovascular:  RRR, No M/R/G Respiratory: Lungs CTAB Gastrointestinal: Abdomen soft, NT/ND with normal active bowel sounds. Extremities: No C/E/C   The results of significant diagnostics from this hospitalization (including imaging, microbiology, ancillary and laboratory) are listed below for reference.     Procedures and Diagnostic Studies:   Ct Abdomen Pelvis W Contrast  01/11/2016  ADDENDUM REPORT: 01/11/2016 22:37 ADDENDUM: These results were called by telephone at the time of interpretation on <CurrentDate> at <CurrentTime> to Dr. Shanon Brow , who verbally acknowledged these results. Electronically Signed   By: Anner Crete M.D.   On: 01/11/2016 22:37  01/11/2016  CLINICAL DATA:  75 year old male with bilateral lower quadrant abdominal pain. EXAM: CT ABDOMEN AND PELVIS WITH CONTRAST TECHNIQUE: Multidetector CT imaging of the abdomen and pelvis was performed using the standard protocol following bolus administration of intravenous contrast. CONTRAST:  148mL ISOVUE-300 IOPAMIDOL (ISOVUE-300) INJECTION 61% COMPARISON:  None. FINDINGS: The visualized lung bases are clear. There is coronary vascular calcification. No intra-abdominal free air or free fluid. Cholecystectomy. Linear and branching air noted within the peripheral left lobe of the liver concerning for of the portal venous gas. The liver is otherwise unremarkable. The pancreas, spleen, and the right adrenal gland appear unremarkable. There is mild thickening of the left adrenal gland. Multiple  small nonobstructing bilateral renal calculi measuring up to 4 mm. There is mild fullness of the renal collecting systems bilaterally. There is a 5 mm calculus in the proximal right ureter. There is a 2.6 cm exophytic hypodense lesion arising from the posterior cortex of the right kidney most compatible with a cyst. The urinary bladder is distended. There is trabecular appearance of the bladder wall compatible with chronic bladder outlet obstruction. The prostate gland is mildly enlarged and measures 4.5 cm in transverse diameter. There is apparent thickening of the distal stomach in the region of the antrum and pylorus which may be related to underdistention or represent gastritis. Clinical correlation is recommended. Multiple small duodenal diverticula without definite active inflammation. There is no evidence of bowel obstruction. The visualized appendix appears unremarkable. There is aortoiliac atherosclerotic disease. There is focal area of advanced atherosclerotic plaque with narrowing of the aorta just inferior to the origins of the renal arteries. The aorta is tortuous. There is atherosclerotic calcifications of the origins of the celiac axis and SMA. The origins the celiac axis, SMA appear patent. The SMV, and main portal vein are patent. No portal venous gas identified. There is no adenopathy. Set the abdominal wall soft tissues appear unremarkable. There is osteopenia with degenerative changes of the spine. No acute fracture. IMPRESSION: Linear branching air in the left lobe of the liver concerning for portal venous gas. This is of indeterminate etiology, however the possibility of ischemic  bowel is not excluded. Clinical correlation is recommended. Underdistention of the stomach versus gastritis. No evidence of bowel obstruction. No CT evidence for acute appendicitis. A 5 mm proximal right ureteral calculus with small nonobstructing bilateral renal calculi. Mild fullness of the renal collecting systems  bilaterally. Trabecular appearance of the urinary bladder compatible with chronic bladder outlet obstruction. Electronically Signed: By: Anner Crete M.D. On: 01/11/2016 22:26     Labs:   Basic Metabolic Panel:  Recent Labs Lab 01/11/16 1933  01/13/16 0814 01/14/16 0516 01/15/16 0223 01/16/16 0953 01/17/16 0610  NA 134*  < > 139 140 138 142 142  K 2.8*  < > 3.1* 3.0* 3.0* 4.0 4.1  CL 94*  < > 99* 104 103 107 101  CO2 31  < > 26 24 26 27 30   GLUCOSE 129*  < > 106* 89 86 137* 94  BUN 24*  < > 26* 32* 21* 15 16  CREATININE 0.78  < > 1.17 1.06 0.70 0.77 0.71  CALCIUM 9.2  < > 9.0 8.6* 8.3* 8.3* 8.5*  MG 1.7  --   --   --  1.6*  --   --   < > = values in this interval not displayed. GFR Estimated Creatinine Clearance: 60 mL/min (by C-G formula based on Cr of 0.71). Liver Function Tests:  Recent Labs Lab 01/11/16 1933  AST 20  ALT 12*  ALKPHOS 124  BILITOT 0.5  PROT 6.9  ALBUMIN 3.5    Recent Labs Lab 01/11/16 1933  LIPASE 31   Coagulation profile  Recent Labs Lab 01/11/16 1933  INR 1.24    CBC:  Recent Labs Lab 01/11/16 1933  01/13/16 0543 01/14/16 0516 01/15/16 0223 01/16/16 0642 01/17/16 0610  WBC 19.8*  < > 37.1* 26.8* 18.3* 12.8* 12.3*  NEUTROABS 16.7*  --   --   --   --   --   --   HGB 11.0*  < > 10.1* 9.7* 9.1* 9.3* 8.8*  HCT 32.4*  < > 31.6* 30.1* 28.2* 30.0* 28.8*  MCV 87.3  < > 86.8 85.8 85.5 88.0 87.3  PLT 332  < > 274 271 253 233 232  < > = values in this interval not displayed. Cardiac Enzymes:  Recent Labs Lab 01/11/16 1933  TROPONINI <0.03   Lipid Profile  Recent Labs  01/15/16 0223  CHOL 77  HDL 20*  LDLCALC 36  TRIG 107  CHOLHDL 3.9   Thyroid function studies  Recent Labs  01/14/16 1437  TSH 1.248   Microbiology Recent Results (from the past 240 hour(s))  MRSA PCR Screening     Status: None   Collection Time: 01/12/16  2:32 AM  Result Value Ref Range Status   MRSA by PCR NEGATIVE NEGATIVE Final     Comment:        The GeneXpert MRSA Assay (FDA approved for NASAL specimens only), is one component of a comprehensive MRSA colonization surveillance program. It is not intended to diagnose MRSA infection nor to guide or monitor treatment for MRSA infections.   C difficile quick scan w PCR reflex     Status: None   Collection Time: 01/15/16 10:02 AM  Result Value Ref Range Status   C Diff antigen NEGATIVE NEGATIVE Final   C Diff toxin NEGATIVE NEGATIVE Final   C Diff interpretation Negative for toxigenic C. difficile  Final     Discharge Instructions:   Discharge Instructions    AMB Referral to [facility]  Management    Complete by:  As directed   Please assign to [place] for transition of care for symptom and disease management for COPD.  Also has had new onsent AFIB with RVR with this admission. Tomasa Blase, wife,  asks to please be called for transition of care calls- at [phone]. Written consent obtained. Likely discharge over the weekend. Please call with questions. Marthenia Rolling, [place], [facility] Liaison-[phone]  Reason for consult:  Please assign to [facility] RNCM  Diagnoses of:  COPD/ Pneumonia  Expected date of contact:  1-3 days (reserved for hospital discharges)     Call MD for:  extreme fatigue    Complete by:  As directed      Call MD for:  severe uncontrolled pain    Complete by:  As directed      Call MD for:  temperature >100.4    Complete by:  As directed      Diet - low sodium heart healthy    Complete by:  As directed      Discharge instructions    Complete by:  As directed   It is important that you quit smoking.  Notify your doctor if you notice you are having trouble urinating.     Increase activity slowly    Complete by:  As directed             Medication List    STOP taking these medications        chlorthalidone 25 MG tablet  Commonly known as:  HYGROTON     labetalol 300 MG tablet  Commonly known as:  NORMODYNE        TAKE these medications        acetaminophen 500 MG tablet  Commonly known as:  TYLENOL  Take 500 mg by mouth every 6 (six) hours as needed for mild pain or moderate pain.     aspirin EC 81 MG tablet  Take 81 mg by mouth daily.     cefUROXime 500 MG tablet  Commonly known as:  CEFTIN  Take 1 tablet (500 mg total) by mouth 2 (two) times daily with a meal.     digoxin 0.125 MG tablet  Commonly known as:  LANOXIN  Take 1 tablet (0.125 mg total) by mouth daily.     multivitamins ther. w/minerals Tabs tablet  Take 1 tablet by mouth daily.     nicotine 14 mg/24hr patch  Commonly known as:  NICODERM CQ - dosed in mg/24 hours  Place 1 patch (14 mg total) onto the skin daily.     OXYGEN  Inhale 2 L into the lungs at bedtime.     propranolol 20 MG tablet  Commonly known as:  INDERAL  Take 1 tablet (20 mg total) by mouth 3 (three) times daily.     rivaroxaban 20 MG Tabs tablet  Commonly known as:  XARELTO  Take 1 tablet (20 mg total) by mouth daily with supper.     simvastatin 40 MG tablet  Commonly known as:  ZOCOR  Take 1 tablet by mouth daily.     tamsulosin 0.4 MG Caps capsule  Commonly known as:  FLOMAX  Take 1 capsule (0.4 mg total) by mouth daily.          Time coordinating discharge: 35 minutes.  Signed:  Caedan Sumler  Pager [phone] [facility] 01/17/2016, 10:36 AM

## 2016-01-17 NOTE — Progress Notes (Signed)
Patient ID: Steve Gomez, male   DOB: 08-15-1941, 75 y.o.   MRN: [id number]    Subjective:  Sats dropping with ambulation   Objective:  Filed Vitals:   01/16/16 1423 01/16/16 1529 01/16/16 2031 01/17/16 0552  BP:  107/62 103/67 104/64  Pulse:   98 96  Temp: 98.2 F (36.8 C)  98.3 F (36.8 C) 98.8 F (37.1 C)  TempSrc: Oral  Oral   Resp:   20 19  Height:      Weight:    52.39 kg (115 lb 8 oz)  SpO2:   97% 97%    Intake/Output from previous day:  Intake/Output Summary (Last 24 hours) at 01/17/16 [id number] Last data filed at 01/17/16 0555  Gross per 24 hour  Intake      0 ml  Output   1000 ml  Net  -1000 ml    Physical Exam: Affect appropriate Chronically ill white male  HEENT: normal Neck supple with no adenopathy JVP normal no bruits no thyromegaly Lungs clear with no wheezing and good diaphragmatic motion Heart:  S1/S2 MR  murmur, no rub, gallop or click PMI normal Abdomen: benighn, BS positve, no tenderness, no AAA no bruit.  No HSM or HJR Distal pulses intact with no bruits No edema Neuro non-focal Skin warm and dry No muscular weakness   Lab Results: Basic Metabolic Panel:  Recent Labs  01/15/16 0223 01/16/16 0953  NA 138 142  K 3.0* 4.0  CL 103 107  CO2 26 27  GLUCOSE 86 137*  BUN 21* 15  CREATININE 0.70 0.77  CALCIUM 8.3* 8.3*  MG 1.6*  --    CBC:  Recent Labs  01/15/16 0223 01/16/16 0642  WBC 18.3* 12.8*  HGB 9.1* 9.3*  HCT 28.2* 30.0*  MCV 85.5 88.0  PLT 253 233   Fasting Lipid Panel:  Recent Labs  01/15/16 0223  CHOL 77  HDL 20*  LDLCALC 36  TRIG 107  CHOLHDL 3.9   Thyroid Function Tests:  Recent Labs  01/14/16 1437  TSH 1.248    Imaging: No results found.  Cardiac Studies:  ECG: rapid afib rate 120    Telemetry: afib rates 100-110  Echo:   Medications:   . digoxin  0.125 mg Oral Daily  . nicotine  14 mg Transdermal Daily  . piperacillin-tazobactam (ZOSYN)  IV  3.375 g Intravenous Q8H  . propranolol   20 mg Oral TID  . rivaroxaban  20 mg Oral Q supper  . sodium chloride flush  3 mL Intravenous Q12H  . tamsulosin  0.4 mg Oral Daily        Assessment/Plan:  Steve Gomez is a 75 y.o. male with a history of AAA s/p repair, HLD, HTN and COPD/tobacco abuse who presented to [facility] ED on 01/12/16 with abdominal pain with N/V. CT findings concerning for portal venous gas and found to be in afib with RVR and he was transferred to [facility] for further evaluation and treatment.   New onset afib with RVR:  Rate control better with higher dose inderal  Xarelto started yesterday   AAA s/p repair: over 20 years ago in [place]  HTN: BP currently soft. But maintaining over [id number] systolic   HLD: continue statin.   Celiac and SMA atherosclerosis: non obstructive  Leukocytosis:  Improved  Lab Results  Component Value Date   WBC 12.8* 01/16/2016    ?urinary source, possible hydro on CT from kidney stones, has coude catheter. On Zosyn Day #5 .  Per IM, surgery and urology  Acute urinary retention & B/L renal calculi with fullness of B/L renal collecting systems: urology following, foley to stay in until getting up and moving around more  Tobacco abuse: counseled on the importance of quitting. Continue nicotine patch.   Jenkins Rouge 01/17/2016, 7:22 AM

## 2016-01-17 NOTE — Progress Notes (Signed)
Pt bladder scanned, 275ml. Still awaiting to void. Dr. Rockne Menghini made aware

## 2016-01-19 ENCOUNTER — Other Ambulatory Visit: Payer: Self-pay | Admitting: *Deleted

## 2016-01-19 ENCOUNTER — Encounter: Payer: Self-pay | Admitting: *Deleted

## 2016-01-19 NOTE — Patient Outreach (Signed)
[facility] [place] [facility]) Care Management  01/19/2016  Steve Gomez 1941-02-10 [id number]  I reached out to Steve Gomez today and spoke with him and his wife in follow up to recent hospitalization/transition of care assessment. Steve Gomez was discharged from the hospital on 01/17/16 after an admission for right upper quadrant abdominal pain x 2 weeks. In the ED, Steve Gomez was found to Afib with RVR. He was transferred to [place] cath was placed and Steve Gomez was treated with Cardizem drip and started on oral anticoagulation.   Today, Steve Gomez reports feeling better but is anxious to get his foley cath out. His wife called Dr. Luan Pulling office to request referral to urology for removal but has not yet heard back from them. In addition, Steve Gomez does not have a cardiology or afib clinic follow up appointment. I will reach out to Dr. Luan Pulling office and [facility] first thing in the morning to assist with appointment scheduling.   Patient was recently discharged from hospital and all medications have been reviewed in detailed. Steve Gomez is most knowledgeable about Steve Gomez medications and asked very appropriate questions.   Plan: I will reach out to provider offices in the morning as stated and follow up with Steve Gomez and his wife. I will follow up weekly for continued transition of care assessments.    [place] Management  [phone]

## 2016-01-20 ENCOUNTER — Other Ambulatory Visit: Payer: Self-pay | Admitting: *Deleted

## 2016-01-20 NOTE — Patient Outreach (Signed)
[facility]) Care Management  01/20/2016  Steve Gomez [birth year] [id number]  I reached out to [place] to secure a post hospital visit for new diagnosis Afib (set for tomorrow) and discussed with Dr. Luan Pulling' office in person the need for urology follow up/referral locally as recommended at hospital discharge. Steve Gomez has a foley in place which he would like to have removed prior to Thursday when he is to attend a funeral.   I called Steve Gomez but was unable to reach him and left a HIPPA compliant voice message requesting return call.   Plan: I will see Steve Gomez at home next week for initial home visit.    [place] Management  [phone]

## 2016-01-21 ENCOUNTER — Ambulatory Visit (INDEPENDENT_AMBULATORY_CARE_PROVIDER_SITE_OTHER): Payer: Commercial Managed Care - HMO | Admitting: Physician Assistant

## 2016-01-21 ENCOUNTER — Encounter: Payer: Self-pay | Admitting: Physician Assistant

## 2016-01-21 VITALS — BP 104/68 | HR 125 | Ht 64.0 in | Wt 119.6 lb

## 2016-01-21 DIAGNOSIS — I10 Essential (primary) hypertension: Secondary | ICD-10-CM

## 2016-01-21 DIAGNOSIS — Z720 Tobacco use: Secondary | ICD-10-CM

## 2016-01-21 DIAGNOSIS — I4891 Unspecified atrial fibrillation: Secondary | ICD-10-CM

## 2016-01-21 MED ORDER — PROPRANOLOL HCL 20 MG PO TABS: 30.0000 mg | ORAL_TABLET | Freq: Three times a day (TID) | ORAL | Status: DC

## 2016-01-21 NOTE — Patient Instructions (Signed)
Your physician recommends that you schedule a follow-up appointment in: June with Dr. Johnsie Cancel   Your physician has recommended you make the following change in your medication:   Increase Inderal to 30 mg Three Times Daily  Your physician recommends that you schedule a follow-up appointment on Friday for Heart Rate check and EKG.  If you need a refill on your cardiac medications before your next appointment, please call your pharmacy.  Thank you for choosing [facility]!

## 2016-01-21 NOTE — Progress Notes (Signed)
Cardiology Office Note    Date:  01/21/2016   ID:  Steve Gomez, DOB [birth year], MRN [id number]  PCP:  Alonza Bogus, MD  Cardiologist: Dr. Johnsie Cancel   Chief complaint post hospital for atrial fibrillation  History of Present Illness:  Steve Gomez is a 75 y.o. male with a history of AAA s/p repair, HLD, HTN and COPD/tobacco abuse who presented to [facility] ED on 01/12/16 with abdominal pain with N/V. CT findings concerning for portal venous gas and found to be in afib with RVR and he was transferred to [facility] for further evaluation and treatment. He was initially treated with IV heparin and Cardizem drip but this was discontinued due to hypotension. He was then started on labetalol 100 mg twice a day and digoxin 0.125 mg daily.rate was not controlled so he was started on Inderal 20 mg 3 times a day and labetalol wall was stopped. CHADSVASC=3.Xarelto was started.he was found to have acute urinary retention and bilateral renal calculi with fullness of the renal collecting system and Foley was placed.2-D echo showed normal LV function EF 55-60% moderate MR and TR.  Patient's brother died on 2022/12/29 unexpectedly and he is quite upset. The funeral is tomorrow. His heart rate is 125 beats per minute today the office. He is taking the Inderal 3 times daily. Blood pressure is 104/68.Patient is asymptomatic with his atrial fibrillation. He cannot feel it racing. He denies any chest pain, palpitations, dyspnea, dyspnea on exertion, dizziness or presyncope.     Past Medical History  Diagnosis Date  . COPD (chronic obstructive pulmonary disease) ([place])   . Hypertension   . High cholesterol   . S/P AAA repair   . Renal calculi 01/14/2016  . New onset atrial fibrillation ([place])   . Bladder outlet obstruction 01/14/2016  . Atherosclerosis   . Tobacco abuse     No past surgical history on file.  Current Medications: Outpatient Prescriptions Prior to Visit  Medication Sig Dispense Refill  .  acetaminophen (TYLENOL) 500 MG tablet Take 500 mg by mouth every 6 (six) hours as needed for mild pain or moderate pain.    Marland Kitchen aspirin EC 81 MG tablet Take 81 mg by mouth daily.    . digoxin (LANOXIN) 0.125 MG tablet Take 1 tablet (0.125 mg total) by mouth daily. 90 tablet 2  . Multiple Vitamins-Minerals (MULTIVITAMINS THER. W/MINERALS) TABS tablet Take 1 tablet by mouth daily.    . nicotine (NICODERM CQ - DOSED IN MG/24 HOURS) 14 mg/24hr patch Place 1 patch (14 mg total) onto the skin daily. 28 patch 0  . OXYGEN Inhale 2 L into the lungs at bedtime.    . rivaroxaban (XARELTO) 20 MG TABS tablet Take 1 tablet (20 mg total) by mouth daily with supper. 90 tablet 0  . simvastatin (ZOCOR) 40 MG tablet Take 1 tablet by mouth daily.    . tamsulosin (FLOMAX) 0.4 MG CAPS capsule Take 1 capsule (0.4 mg total) by mouth daily. 90 capsule 2  . propranolol (INDERAL) 20 MG tablet Take 1 tablet (20 mg total) by mouth 3 (three) times daily. 270 tablet 2  . cefUROXime (CEFTIN) 500 MG tablet Take 1 tablet (500 mg total) by mouth 2 (two) times daily with a meal. 4 tablet 0   No facility-administered medications prior to visit.     Allergies:   Review of patient's allergies indicates no known allergies.   Social History   Social History  . Marital Status: Married    Spouse Name:  N/A  . Number of Children: N/A  . Years of Education: N/A   Social History Main Topics  . Smoking status: Current Every Day Smoker -- 1.00 packs/day    Types: Cigarettes    Start date: 08/30/1960  . Smokeless tobacco: None  . Alcohol Use: 0.0 oz/week    0 Standard drinks or equivalent per week     Comment: "just a couple of swallows of wine every night"  . Drug Use: No  . Sexual Activity: Not Asked   Other Topics Concern  . None   Social History Narrative     Family History:  The patient's    family history is not on file.   ROS:   Please see the history of present illness.    Review of Systems  Constitution:  Positive for decreased appetite and weakness.  HENT: Positive for hearing loss.   Cardiovascular: Negative.   Respiratory: Negative.   Endocrine: Negative.   Hematologic/Lymphatic: Negative.   Musculoskeletal: Positive for arthritis and back pain.  Gastrointestinal: Negative.   Genitourinary: Negative.        Foley catheter in place   All other systems reviewed and are negative.   PHYSICAL EXAM:   VS:  BP 104/68 mmHg  Pulse 125  Ht 5\' 4"  (1.626 m)  Wt 119 lb 9.6 oz (54.25 kg)  BMI 20.52 kg/m2  SpO2 98%   GEN: Well nourished, well developed, in no acute distress HEENT: normal Neck: no JVD, carotid bruits, or masses Cardiac:  Irregular irregular at 130 beats per minute no murmurs, rubs, or gallops,no edema  Respiratory:  clear to auscultation bilaterally, normal work of breathing GI: soft, nontender, nondistended, + BS MS: no deformity or atrophy Skin: warm and dry, no rash Neuro:  Alert and Oriented x 3, Strength and sensation are intact Psych: euthymic mood, full affect  Wt Readings from Last 3 Encounters:  01/21/16 119 lb 9.6 oz (54.25 kg)  01/17/16 115 lb 8 oz (52.39 kg)  08/18/15 130 lb (58.968 kg)      Studies/Labs Reviewed:   EKG:  EKG is ordered today.  The ekg ordered today demonstrates atrial fibrillation at 130 beats per minute with anterior fascicular block and nonspecific ST-T wave changes  Recent Labs: 01/11/2016: ALT 12* 01/14/2016: TSH 1.248 01/15/2016: Magnesium 1.6* 01/17/2016: BUN 16; Creatinine, Ser 0.71; Hemoglobin 8.8*; Platelets 232; Potassium 4.1; Sodium 142   Lipid Panel    Component Value Date/Time   CHOL 77 01/15/2016 0223   TRIG 107 01/15/2016 0223   HDL 20* 01/15/2016 0223   CHOLHDL 3.9 01/15/2016 0223   VLDL 21 01/15/2016 0223   LDLCALC 36 01/15/2016 0223    Additional studies/ records that were reviewed today include:  2-D echo 01/14/16  Study Conclusions   - Left ventricle: The cavity size was normal. Wall thickness was    normal. Systolic function was normal. The estimated ejection   fraction was in the range of 55% to 60%. Wall motion was normal;   there were no regional wall motion abnormalities. - Mitral valve: Calcified annulus. There was moderate   regurgitation. - Tricuspid valve: There was moderate regurgitation.      ASSESSMENT:    1. Atrial fibrillation with RVR ([place])   2. Essential hypertension   3. Tobacco abuse      PLAN:  In order of problems listed above:  Atrial fibrillation with RVR patient's heart rate is still not controlled on Inderal 20 mg 3 times a day. His blood  pressure is on the low side. He is asymptomatic. He just ordered a refill for this medication and would prefer not to change medicines at this time. Increase Inderal to 30 mg 3 times a day. He is to come back in 2 days for an EKG and heart rate check. Medications will be again adjusted if needed. Followup with Dr. Johnsie Cancel June 6.  Hypertension blood pressure is on the low side  Tobacco abuse patient is trying to quit smoking and is wearing the nicotine patches    Medication Adjustments/Labs and Tests Ordered: Current medicines are reviewed at length with the patient today.  Concerns regarding medicines are outlined above.  Medication changes, Labs and Tests ordered today are listed in the Patient Instructions below. Patient Instructions  Your physician recommends that you schedule a follow-up appointment in: June with Dr. Johnsie Cancel   Your physician has recommended you make the following change in your medication:   Increase Inderal to 30 mg Three Times Daily  Your physician recommends that you schedule a follow-up appointment on Friday for Heart Rate check and EKG.  If you need a refill on your cardiac medications before your next appointment, please call your pharmacy.  Thank you for choosing [facility]!            Sumner Boast, PA-C  01/21/2016 1:50 PM    [facility]  [facility], [place], [place]  [address] Phone: [phone]; Fax: [phone]

## 2016-01-23 ENCOUNTER — Ambulatory Visit (INDEPENDENT_AMBULATORY_CARE_PROVIDER_SITE_OTHER): Payer: Commercial Managed Care - HMO

## 2016-01-23 ENCOUNTER — Other Ambulatory Visit: Payer: Self-pay | Admitting: *Deleted

## 2016-01-23 VITALS — BP 96/50 | HR 95 | Ht 64.0 in | Wt 117.0 lb

## 2016-01-23 DIAGNOSIS — I4891 Unspecified atrial fibrillation: Secondary | ICD-10-CM

## 2016-01-23 NOTE — Patient Instructions (Addendum)
No changes in medication today.Follow up with Dr Johnsie Cancel as planned on 02/03/16 at 830 am    Thank you for choosing [facility] !

## 2016-01-23 NOTE — Patient Outreach (Signed)
[facility]) Care Management  01/23/2016  Steve Gomez 06/19/1941 [id number]  Telephone outreach Gomez Steve Gomez after receiving message from Mrs. Dill regarding Steve Gomez foley Gomez removal. I reached out Gomez the primary care office and the [facility] Urology office on Tuesday re: removal of Steve Gomez, as directed at hospital discharge on Saturday.   Mrs. Gomez called today Gomez request assistance with urology provider appointment. I called [facility] in [place] @ [phone] and spoke with the triage nurse Steve Gomez, explaining Steve Gomez needs. I asked if Steve Gomez could be contacted by someone at the urology office with instructions regarding foley care and removal. Steve Gomez kindly offered Gomez reach out Gomez Steve Gomez and his wife today with offer of appointment with nurse practitioner today.  Plan: I have a scheduled home visit with Steve Gomez on Wednesday 01/28/16.    [facility]  [phone]

## 2016-01-23 NOTE — Progress Notes (Signed)
Returns for EKG after medication change from 01/21/16 with Estella Husk PA-C BP 96/50 asymptomatic  HR 100  EKG obtained,Vs reviewed by Dr Doreatha Lew changes with medication today.Follow up with Dr Johnsie Cancel as planned

## 2016-01-25 ENCOUNTER — Inpatient Hospital Stay (HOSPITAL_COMMUNITY)
Admission: EM | Admit: 2016-01-25 | Discharge: 2016-02-09 | DRG: 356 | Disposition: A | Discharge status: Home Health | Payer: Commercial Managed Care - HMO | Attending: Internal Medicine | Admitting: Internal Medicine

## 2016-01-25 ENCOUNTER — Inpatient Hospital Stay (HOSPITAL_COMMUNITY): Payer: Commercial Managed Care - HMO

## 2016-01-25 ENCOUNTER — Emergency Department (HOSPITAL_COMMUNITY): Payer: Commercial Managed Care - HMO

## 2016-01-25 ENCOUNTER — Encounter (HOSPITAL_COMMUNITY): Payer: Self-pay | Admitting: Emergency Medicine

## 2016-01-25 DIAGNOSIS — I10 Essential (primary) hypertension: Secondary | ICD-10-CM | POA: Diagnosis present

## 2016-01-25 DIAGNOSIS — R197 Diarrhea, unspecified: Secondary | ICD-10-CM

## 2016-01-25 DIAGNOSIS — K625 Hemorrhage of anus and rectum: Secondary | ICD-10-CM | POA: Diagnosis not present

## 2016-01-25 DIAGNOSIS — K668 Other specified disorders of peritoneum: Secondary | ICD-10-CM | POA: Diagnosis not present

## 2016-01-25 DIAGNOSIS — K55059 Acute (reversible) ischemia of intestine, part and extent unspecified: Secondary | ICD-10-CM | POA: Diagnosis not present

## 2016-01-25 DIAGNOSIS — A047 Enterocolitis due to Clostridium difficile: Secondary | ICD-10-CM | POA: Diagnosis not present

## 2016-01-25 DIAGNOSIS — N132 Hydronephrosis with renal and ureteral calculous obstruction: Secondary | ICD-10-CM | POA: Diagnosis not present

## 2016-01-25 DIAGNOSIS — I480 Paroxysmal atrial fibrillation: Secondary | ICD-10-CM | POA: Diagnosis present

## 2016-01-25 DIAGNOSIS — L8990 Pressure ulcer of unspecified site, unspecified stage: Secondary | ICD-10-CM | POA: Insufficient documentation

## 2016-01-25 DIAGNOSIS — E7800 Pure hypercholesterolemia, unspecified: Secondary | ICD-10-CM | POA: Diagnosis present

## 2016-01-25 DIAGNOSIS — I700 Atherosclerosis of aorta: Secondary | ICD-10-CM | POA: Diagnosis not present

## 2016-01-25 DIAGNOSIS — L89151 Pressure ulcer of sacral region, stage 1: Secondary | ICD-10-CM | POA: Diagnosis present

## 2016-01-25 DIAGNOSIS — K633 Ulcer of intestine: Secondary | ICD-10-CM | POA: Diagnosis present

## 2016-01-25 DIAGNOSIS — G4733 Obstructive sleep apnea (adult) (pediatric): Secondary | ICD-10-CM | POA: Diagnosis not present

## 2016-01-25 DIAGNOSIS — I959 Hypotension, unspecified: Secondary | ICD-10-CM | POA: Diagnosis not present

## 2016-01-25 DIAGNOSIS — E43 Unspecified severe protein-calorie malnutrition: Secondary | ICD-10-CM | POA: Diagnosis not present

## 2016-01-25 DIAGNOSIS — J449 Chronic obstructive pulmonary disease, unspecified: Secondary | ICD-10-CM | POA: Diagnosis not present

## 2016-01-25 DIAGNOSIS — I4891 Unspecified atrial fibrillation: Secondary | ICD-10-CM | POA: Diagnosis not present

## 2016-01-25 DIAGNOSIS — E876 Hypokalemia: Secondary | ICD-10-CM | POA: Diagnosis not present

## 2016-01-25 DIAGNOSIS — E785 Hyperlipidemia, unspecified: Secondary | ICD-10-CM | POA: Diagnosis present

## 2016-01-25 DIAGNOSIS — Z452 Encounter for adjustment and management of vascular access device: Secondary | ICD-10-CM | POA: Diagnosis not present

## 2016-01-25 DIAGNOSIS — J9811 Atelectasis: Secondary | ICD-10-CM | POA: Diagnosis not present

## 2016-01-25 DIAGNOSIS — Z6820 Body mass index (BMI) 20.0-20.9, adult: Secondary | ICD-10-CM | POA: Diagnosis not present

## 2016-01-25 DIAGNOSIS — Z7982 Long term (current) use of aspirin: Secondary | ICD-10-CM

## 2016-01-25 DIAGNOSIS — Z7901 Long term (current) use of anticoagulants: Secondary | ICD-10-CM

## 2016-01-25 DIAGNOSIS — Z8679 Personal history of other diseases of the circulatory system: Secondary | ICD-10-CM | POA: Diagnosis not present

## 2016-01-25 DIAGNOSIS — D62 Acute posthemorrhagic anemia: Secondary | ICD-10-CM | POA: Diagnosis not present

## 2016-01-25 DIAGNOSIS — Z681 Body mass index (BMI) 19 or less, adult: Secondary | ICD-10-CM | POA: Diagnosis not present

## 2016-01-25 DIAGNOSIS — I81 Portal vein thrombosis: Secondary | ICD-10-CM

## 2016-01-25 DIAGNOSIS — K559 Vascular disorder of intestine, unspecified: Secondary | ICD-10-CM

## 2016-01-25 DIAGNOSIS — N320 Bladder-neck obstruction: Secondary | ICD-10-CM | POA: Diagnosis present

## 2016-01-25 DIAGNOSIS — R339 Retention of urine, unspecified: Secondary | ICD-10-CM | POA: Diagnosis present

## 2016-01-25 DIAGNOSIS — K2980 Duodenitis without bleeding: Secondary | ICD-10-CM | POA: Diagnosis not present

## 2016-01-25 DIAGNOSIS — K551 Chronic vascular disorders of intestine: Principal | ICD-10-CM | POA: Diagnosis present

## 2016-01-25 DIAGNOSIS — R269 Unspecified abnormalities of gait and mobility: Secondary | ICD-10-CM | POA: Diagnosis not present

## 2016-01-25 DIAGNOSIS — A09 Infectious gastroenteritis and colitis, unspecified: Secondary | ICD-10-CM | POA: Diagnosis not present

## 2016-01-25 DIAGNOSIS — K922 Gastrointestinal hemorrhage, unspecified: Secondary | ICD-10-CM | POA: Diagnosis not present

## 2016-01-25 DIAGNOSIS — F17210 Nicotine dependence, cigarettes, uncomplicated: Secondary | ICD-10-CM | POA: Diagnosis present

## 2016-01-25 DIAGNOSIS — K254 Chronic or unspecified gastric ulcer with hemorrhage: Secondary | ICD-10-CM | POA: Diagnosis not present

## 2016-01-25 DIAGNOSIS — K5000 Crohn's disease of small intestine without complications: Secondary | ICD-10-CM | POA: Diagnosis not present

## 2016-01-25 DIAGNOSIS — Z09 Encounter for follow-up examination after completed treatment for conditions other than malignant neoplasm: Secondary | ICD-10-CM

## 2016-01-25 DIAGNOSIS — R109 Unspecified abdominal pain: Secondary | ICD-10-CM | POA: Diagnosis not present

## 2016-01-25 DIAGNOSIS — T45515A Adverse effect of anticoagulants, initial encounter: Secondary | ICD-10-CM | POA: Diagnosis not present

## 2016-01-25 DIAGNOSIS — Z4682 Encounter for fitting and adjustment of non-vascular catheter: Secondary | ICD-10-CM | POA: Diagnosis not present

## 2016-01-25 LAB — CBC
HCT: 23.4 % — ABNORMAL LOW (ref 39.0–52.0)
HCT: 27.3 % — ABNORMAL LOW (ref 39.0–52.0)
HEMOGLOBIN: 9 g/dL — AB (ref 13.0–17.0)
Hemoglobin: 7.4 g/dL — ABNORMAL LOW (ref 13.0–17.0)
MCH: 28 pg (ref 26.0–34.0)
MCH: 28.1 pg (ref 26.0–34.0)
MCHC: 31.6 g/dL (ref 30.0–36.0)
MCHC: 33 g/dL (ref 30.0–36.0)
MCV: 88.6 fL (ref 78.0–100.0)
MCV: 85.3 fL (ref 78.0–100.0)
PLATELETS: 329 10*3/uL (ref 150–400)
Platelets: 450 K/uL — ABNORMAL HIGH (ref 150–400)
RBC: 2.64 MIL/uL — ABNORMAL LOW (ref 4.22–5.81)
RBC: 3.2 MIL/uL — AB (ref 4.22–5.81)
RDW: 15.9 % — ABNORMAL HIGH (ref 11.5–15.5)
RDW: 16.5 % — ABNORMAL HIGH (ref 11.5–15.5)
WBC: 23.5 K/uL — ABNORMAL HIGH (ref 4.0–10.5)
WBC: 27.7 10*3/uL — ABNORMAL HIGH (ref 4.0–10.5)

## 2016-01-25 LAB — COMPREHENSIVE METABOLIC PANEL
ALBUMIN: 2.5 g/dL — AB (ref 3.5–5.0)
ALK PHOS: 109 U/L (ref 38–126)
ALT: 23 U/L (ref 17–63)
ANION GAP: 9 (ref 5–15)
AST: 29 U/L (ref 15–41)
BILIRUBIN TOTAL: 0.4 mg/dL (ref 0.3–1.2)
BUN: 26 mg/dL — ABNORMAL HIGH (ref 6–20)
CALCIUM: 8.2 mg/dL — AB (ref 8.9–10.3)
CO2: 24 mmol/L (ref 22–32)
Chloride: 100 mmol/L — ABNORMAL LOW (ref 101–111)
Creatinine, Ser: 0.85 mg/dL (ref 0.61–1.24)
GFR calc non Af Amer: >60 mL/min (ref >60)
Glucose, Bld: 146 mg/dL — ABNORMAL HIGH (ref 65–99)
POTASSIUM: 4.2 mmol/L (ref 3.5–5.1)
SODIUM: 133 mmol/L — AB (ref 135–145)
TOTAL PROTEIN: 5.5 g/dL — AB (ref 6.5–8.1)

## 2016-01-25 LAB — C DIFFICILE QUICK SCREEN W PCR REFLEX
C Diff antigen: POSITIVE — AB
C Diff toxin: NEGATIVE

## 2016-01-25 LAB — PREPARE RBC (CROSSMATCH)

## 2016-01-25 LAB — ABO/RH: ABO/RH(D): O POS

## 2016-01-25 LAB — MRSA PCR SCREENING: MRSA by PCR: NEGATIVE

## 2016-01-25 LAB — URINE MICROSCOPIC-ADD ON: SQUAMOUS EPITHELIAL / LPF: NONE SEEN

## 2016-01-25 LAB — URINALYSIS, ROUTINE W REFLEX MICROSCOPIC
Glucose, UA: NEGATIVE mg/dL
Ketones, ur: NEGATIVE mg/dL
Nitrite: NEGATIVE
Protein, ur: 30 mg/dL — AB
Specific Gravity, Urine: 1.015 (ref 1.005–1.030)
pH: 6 (ref 5.0–8.0)

## 2016-01-25 LAB — PROTIME-INR
INR: 2.4 — AB (ref 0.00–1.49)
PROTHROMBIN TIME: 25.9 s — AB (ref 11.6–15.2)

## 2016-01-25 LAB — LIPASE, BLOOD: Lipase: 58 U/L — ABNORMAL HIGH (ref 11–51)

## 2016-01-25 LAB — HEPARIN LEVEL (UNFRACTIONATED): Heparin Unfractionated: 1.55 IU/mL — ABNORMAL HIGH (ref 0.30–0.70)

## 2016-01-25 LAB — APTT: aPTT: 36 seconds (ref 24–37)

## 2016-01-25 LAB — LACTIC ACID, PLASMA: LACTIC ACID, VENOUS: 0.9 mmol/L (ref 0.5–2.0)

## 2016-01-25 LAB — DIGOXIN LEVEL: Digoxin Level: 0.5 ng/mL — ABNORMAL LOW (ref 0.8–2.0)

## 2016-01-25 MED ORDER — MORPHINE SULFATE (PF) 2 MG/ML IV SOLN: 2.0000 mg | INTRAVENOUS | Status: DC | PRN

## 2016-01-25 MED ORDER — SODIUM CHLORIDE 0.9 % IV SOLN
Freq: Once | INTRAVENOUS | Status: AC
  Administered 2016-01-25: 14:00:00 via INTRAVENOUS

## 2016-01-25 MED ORDER — ONDANSETRON HCL 4 MG PO TABS: 4.0000 mg | ORAL_TABLET | Freq: Four times a day (QID) | ORAL | Status: DC | PRN

## 2016-01-25 MED ORDER — ACETAMINOPHEN 325 MG PO TABS
650.0000 mg | ORAL_TABLET | Freq: Four times a day (QID) | ORAL | Status: DC | PRN
  Administered 2016-02-06 – 2016-02-08 (×3): 650 mg via ORAL
  Filled 2016-01-25 (×3): qty 2

## 2016-01-25 MED ORDER — SODIUM CHLORIDE 0.9 % IV BOLUS (SEPSIS)
500.0000 mL | Freq: Once | INTRAVENOUS | Status: AC
Start: 2016-01-25 — End: 2016-01-25
  Administered 2016-01-25: 500 mL via INTRAVENOUS

## 2016-01-25 MED ORDER — PANTOPRAZOLE SODIUM 40 MG IV SOLR
40.0000 mg | Freq: Once | INTRAVENOUS | Status: AC
  Administered 2016-01-25: 40 mg via INTRAVENOUS
  Filled 2016-01-25: qty 40

## 2016-01-25 MED ORDER — ONDANSETRON HCL 4 MG/2ML IJ SOLN
4.0000 mg | Freq: Four times a day (QID) | INTRAMUSCULAR | Status: DC | PRN
  Filled 2016-01-25: qty 2

## 2016-01-25 MED ORDER — PROTHROMBIN COMPLEX CONC HUMAN 500 UNITS IV KIT
2500.0000 [IU] | PACK | Status: AC
  Administered 2016-01-25: 2500 [IU] via INTRAVENOUS
  Filled 2016-01-25: qty 100

## 2016-01-25 MED ORDER — SODIUM CHLORIDE 0.9 % IV BOLUS (SEPSIS)
1000.0000 mL | Freq: Once | INTRAVENOUS | Status: AC
  Administered 2016-01-25: 1000 mL via INTRAVENOUS

## 2016-01-25 MED ORDER — VANCOMYCIN 50 MG/ML ORAL SOLUTION
125.0000 mg | Freq: Four times a day (QID) | ORAL | Status: DC
  Administered 2016-01-25 – 2016-01-30 (×20): 125 mg via ORAL
  Filled 2016-01-25 (×32): qty 2.5

## 2016-01-25 MED ORDER — VANCOMYCIN 50 MG/ML ORAL SOLUTION
ORAL | Status: AC
  Filled 2016-01-25: qty 10

## 2016-01-25 MED ORDER — SODIUM CHLORIDE 0.9 % IV SOLN
INTRAVENOUS | Status: DC
  Administered 2016-01-25 – 2016-01-26 (×2): via INTRAVENOUS
  Administered 2016-01-27 (×2): 1000 mL via INTRAVENOUS
  Administered 2016-01-28 – 2016-02-03 (×10): via INTRAVENOUS
  Administered 2016-02-04: 1000 mL via INTRAVENOUS

## 2016-01-25 MED ORDER — SODIUM CHLORIDE 0.9% FLUSH
3.0000 mL | Freq: Two times a day (BID) | INTRAVENOUS | Status: DC
  Administered 2016-01-25 – 2016-02-02 (×11): 3 mL via INTRAVENOUS

## 2016-01-25 MED ORDER — IOPAMIDOL (ISOVUE-300) INJECTION 61%
100.0000 mL | Freq: Once | INTRAVENOUS | Status: AC | PRN
  Administered 2016-01-25: 100 mL via INTRAVENOUS

## 2016-01-25 MED ORDER — CETYLPYRIDINIUM CHLORIDE 0.05 % MT LIQD
7.0000 mL | Freq: Two times a day (BID) | OROMUCOSAL | Status: DC
  Administered 2016-01-25 – 2016-02-04 (×20): 7 mL via OROMUCOSAL

## 2016-01-25 MED ORDER — ACETAMINOPHEN 650 MG RE SUPP: 650.0000 mg | Freq: Four times a day (QID) | RECTAL | Status: DC | PRN

## 2016-01-25 MED ORDER — SODIUM CHLORIDE 0.9 % IV BOLUS (SEPSIS)
1000.0000 mL | Freq: Once | INTRAVENOUS | Status: DC
Start: 2016-01-25 — End: 2016-01-25

## 2016-01-25 NOTE — H&P (Signed)
History and Physical    Steve Gomez [id number] DOB: [birth year] DOA: 01/25/2016  PCP: Alonza Bogus, MD  Patient coming from: Home  Chief Complaint: Diarrhea  HPI: Steve Gomez is a 75 y.o. male with medical history significant of COPD, hypertension, hypercholesterolemia, recently diagnosed atrial fibrillation on her alto who presents to the emergency department with complaints of dark looking diarrhea. Patient was recently discharged from [facility] on 01/17/2016, admitted for workup of of abdominal pain with unclear etiology. Patient was discharged home on by mouth Ceftin. During this time, patient was also found to have new onset atrial fibrillation improved with beta blocker. Patient was transitioned from heparin to Xarelto on 01/16/2016.  ED Course: In the emergency department, patient was noted to have a large stool with blood clots noted. Hemoglobin was noted to be 7.4. WBC was noted be 23.5. Gastric urology was consulted. Hospitalist service was consulted for consideration for admission.  Review of Systems:  Review of Systems  Constitutional: Negative for fever, chills and malaise/fatigue.  HENT: Negative for ear pain and hearing loss.   Eyes: Negative for pain and redness.  Respiratory: Negative for hemoptysis and wheezing.   Cardiovascular: Negative for palpitations and orthopnea.  Gastrointestinal: Positive for diarrhea and melena. Negative for abdominal pain.  Genitourinary: Negative for frequency and flank pain.  Musculoskeletal: Negative for back pain and falls.  Neurological: Negative for tingling, tremors and loss of consciousness.  Psychiatric/Behavioral: Negative for hallucinations and memory loss.     Past Medical History  Diagnosis Date  . COPD (chronic obstructive pulmonary disease) ([place])   . Hypertension   . High cholesterol   . S/P AAA repair   . Renal calculi 01/14/2016  . New onset atrial fibrillation ([place])   . Bladder outlet  obstruction 01/14/2016  . Atherosclerosis   . Tobacco abuse     Past Surgical History  Procedure Laterality Date  . Cholecystectomy    . Abdominal aortic aneurysm repair       reports that he has been smoking Cigarettes.  He started smoking about 55 years ago. He has been smoking about 1.00 pack per day. He does not have any smokeless tobacco history on file. He reports that he drinks alcohol. He reports that he does not use illicit drugs.  No Known Allergies  History reviewed. No pertinent family history. Unacceptable: Noncontributory, unremarkable, or negative. Acceptable: Family history reviewed and not pertinent (If you reviewed it)  Prior to Admission medications   Medication Sig Start Date End Date Taking? Authorizing Provider  acetaminophen (TYLENOL) 500 MG tablet Take 500 mg by mouth every 6 (six) hours as needed for mild pain or moderate pain.   Yes Historical Provider, MD  aspirin EC 81 MG tablet Take 81 mg by mouth daily.   Yes Historical Provider, MD  digoxin (LANOXIN) 0.125 MG tablet Take 1 tablet (0.125 mg total) by mouth daily. 01/17/16  Yes Venetia Maxon Rama, MD  Multiple Vitamins-Minerals (MULTIVITAMINS THER. W/MINERALS) TABS tablet Take 1 tablet by mouth daily. 11/30/14  Yes Historical Provider, MD  OXYGEN Inhale 2 L into the lungs at bedtime.   Yes Historical Provider, MD  propranolol (INDERAL) 20 MG tablet Take 1.5 tablets (30 mg total) by mouth 3 (three) times daily. 01/21/16  Yes Imogene Burn, PA-C  rivaroxaban (XARELTO) 20 MG TABS tablet Take 1 tablet (20 mg total) by mouth daily with supper. 01/17/16  Yes Christina P Rama, MD  simvastatin (ZOCOR) 40 MG tablet Take 1 tablet by mouth  daily. 12/01/14  Yes Historical Provider, MD  tamsulosin (FLOMAX) 0.4 MG CAPS capsule Take 1 capsule (0.4 mg total) by mouth daily. 01/17/16  Yes Christina P Rama, MD  nicotine (NICODERM CQ - DOSED IN MG/24 HOURS) 14 mg/24hr patch Place 1 patch (14 mg total) onto the skin daily. Patient not  taking: Reported on 01/25/2016 01/17/16   Venetia Maxon Rama, MD    Physical Exam: Filed Vitals:   01/25/16 0834 01/25/16 0900 01/25/16 1000 01/25/16 1203  BP: 101/63 98/62 99/62  103/62  Pulse: 122 105 107 117  Temp: 98.1 F (36.7 C)     TempSrc: Oral     Resp: 18 18 19 20   Height: 5\' 4"  (1.626 m)     Weight: 53.071 kg (117 lb)     SpO2: 100% 100% 100% 100%      Constitutional: NAD, calm, comfortable Filed Vitals:   01/25/16 0834 01/25/16 0900 01/25/16 1000 01/25/16 1203  BP: 101/63 98/62 99/62  103/62  Pulse: 122 105 107 117  Temp: 98.1 F (36.7 C)     TempSrc: Oral     Resp: 18 18 19 20   Height: 5\' 4"  (1.626 m)     Weight: 53.071 kg (117 lb)     SpO2: 100% 100% 100% 100%   Eyes: PERRL, lids and conjunctivae normal ENMT: Mucous membranes are moist. Posterior pharynx clear of any exudate or lesions.Normal dentition.  Neck: normal, supple, no masses, no thyromegaly Respiratory: clear to auscultation bilaterally, no wheezing, no crackles. Normal respiratory effort. No accessory muscle use.  Cardiovascular: Tachycardic, s1, s2 Abdomen: no tenderness, no masses palpated. No hepatosplenomegaly. Bowel sounds positive.  Musculoskeletal: no clubbing / cyanosis. No joint deformity upper and lower extremities. Good ROM, no contractures. Normal muscle tone.  Skin: no rashes. No induration, appears pale Neurologic: CN 2-12 grossly intact. Sensation intact, DTR normal. Strength 5/5 in all 4.  Psychiatric: Normal judgment and insight. Alert and oriented x 3. Normal mood.    Labs on Admission: I have personally reviewed following labs and imaging studies  CBC:  Recent Labs Lab 01/25/16 0850  WBC 23.5*  HGB 7.4*  HCT 23.4*  MCV 88.6  PLT [id number]*   Basic Metabolic Panel:  Recent Labs Lab 01/25/16 0850  NA 133*  K 4.2  CL 100*  CO2 24  GLUCOSE 146*  BUN 26*  CREATININE 0.85  CALCIUM 8.2*   GFR: Estimated Creatinine Clearance: 57.3 mL/min (by C-G formula based on Cr of  0.85). Liver Function Tests:  Recent Labs Lab 01/25/16 0850  AST 29  ALT 23  ALKPHOS 109  BILITOT 0.4  PROT 5.5*  ALBUMIN 2.5*    Recent Labs Lab 01/25/16 0850  LIPASE 58*   No results for input(s): AMMONIA in the last 168 hours. Coagulation Profile:  Recent Labs Lab 01/25/16 1215  INR 2.40*   Cardiac Enzymes: No results for input(s): CKTOTAL, CKMB, CKMBINDEX, TROPONINI in the last 168 hours. BNP (last 3 results) No results for input(s): PROBNP in the last 8760 hours. HbA1C: No results for input(s): HGBA1C in the last 72 hours. CBG: No results for input(s): GLUCAP in the last 168 hours. Lipid Profile: No results for input(s): CHOL, HDL, LDLCALC, TRIG, CHOLHDL, LDLDIRECT in the last 72 hours. Thyroid Function Tests: No results for input(s): TSH, T4TOTAL, FREET4, T3FREE, THYROIDAB in the last 72 hours. Anemia Panel: No results for input(s): VITAMINB12, FOLATE, FERRITIN, TIBC, IRON, RETICCTPCT in the last 72 hours. Urine analysis:    Component Value Date/Time  COLORURINE YELLOW 01/25/2016 0852   APPEARANCEUR HAZY* 01/25/2016 0852   LABSPEC 1.015 01/25/2016 0852   PHURINE 6.0 01/25/2016 0852   GLUCOSEU NEGATIVE 01/25/2016 0852   HGBUR LARGE* 01/25/2016 0852   BILIRUBINUR SMALL* 01/25/2016 0852   KETONESUR NEGATIVE 01/25/2016 0852   PROTEINUR 30* 01/25/2016 0852   NITRITE NEGATIVE 01/25/2016 0852   LEUKOCYTESUR TRACE* 01/25/2016 0852   Sepsis Labs: !!!!!!!!!!!!!!!!!!!!!!!!!!!!!!!!!!!!!!!!!!!! @LABRCNTIP (procalcitonin:4,lacticidven:4) )No results found for this or any previous visit (from the past 240 hour(s)).   Radiological Exams on Admission: Dg Abd Acute W/chest  01/25/2016  CLINICAL DATA:  Diarrhea.  Abdominal pain.  Weakness. EXAM: DG ABDOMEN ACUTE W/ 1V CHEST COMPARISON:  01/13/2016. FINDINGS: Borderline enlarged cardiac silhouette. Clear lungs. Normal bowel gas pattern without free peritoneal air. Cholecystectomy clips. 6 mm nonspecific oval  calcification overlying the right lower abdomen medially. Marked dextroconvex lumbar rotary scoliosis and degenerative changes. Mild to moderate thoracic scoliosis and degenerative changes. Mild left shoulder degenerative changes. Diffuse osteopenia. Atheromatous arterial calcifications. IMPRESSION: 1. No acute abnormality. 2. 6 mm oval calcification overlying the right lower abdomen, medially. This could potentially represent a ureteral calculus or phlebolith. An appendicolith is less likely. 3. Thoracolumbar scoliosis and degenerative changes. Electronically Signed   By: Claudie Revering M.D.   On: 01/25/2016 10:18     Assessment/Plan Principal Problem:   Acute blood loss anemia Active Problems:   Atrial fibrillation with RVR (H[facility])   COPD (chronic obstructive pulmonary disease) ([facility])   Hypertension   High cholesterol   Hypokalemia   Rectal bleeding    Acute Blood loss anemia -Suspect secondary to active GI bleed -Agree with holding Xarelto -GI consulted to the emergency department -Blood transfusion ordered to the emergency department. We'll continue to follow CBC and transfuse as needed -Given potential for hemodynamic instability, will admit patient to stepdown unit for closer monitoring  Atrial fibrillation with rapid ventricular rate -Heart rate poorly controlled, likely secondary to acute blood loss anemia -Continue with hydration and blood transfusion as needed -Continue home meds as tolerated  Hypertension -Blood pressure mildly soft, likely secondary to acute blood loss anemia -Continue to monitor closely. Avoid aggressive blood pressure management  Hypercholesterolemia -On statin prior to admission     DVT prophylaxis: SCDs Code Status: Full Family Communication: Patient in room Disposition Plan: Uncertain at this time Consults called: Gastroenterology Admission status: Admit to stepdown, inpatient   Keimya Briddell, Orpah Melter MD [facility] Pager [phone]  If 7PM-7AM, please contact night-coverage [email or web address] Password [facility]  01/25/2016, 12:44 PM

## 2016-01-25 NOTE — ED Notes (Signed)
Pt reports diarrhea for 3-4 days and pt noticed this morning that there was a light pink color on the tissue after cleaning up.  Pt was just released from hospital last Saturday for a "venuous gas in stomach", kidney stones, and a-fib. Pt alert and oriented.

## 2016-01-25 NOTE — ED Notes (Signed)
Large stool with blood clots noted, edp informed, denies pain

## 2016-01-25 NOTE — ED Provider Notes (Signed)
CSN: [id number]     Arrival date & time 01/25/16  0825 History  By signing my name below, I, Mesha Guinyard, attest that this documentation has been prepared under the direction and in the presence of Treatment Team:  Attending Provider: Milton Ferguson, MD.  Electronically Signed: Verlee Monte, Medical Scribe. 01/25/2016. 8:47 AM.   Chief Complaint  Patient presents with  . Diarrhea   Patient is a 75 y.o. male presenting with diarrhea. The history is provided by the patient. No language interpreter was used.  Diarrhea Quality:  Black and tarry Severity:  Moderate Timing:  Intermittent Progression:  Unchanged Relieved by:  Nothing Ineffective treatments:  None tried Associated symptoms: no abdominal pain, no headaches and no vomiting   Risk factors: sick contacts (was in the hospital)    HPI Comments: Steve Gomez is a 75 y.o. male who presents to the Emergency Department complaining of intermittent diarrhea onset a week. Pt mentions his diarrhea started after he got out of the hospital last Sunday. Pt takes xerelto. He states that he is having associated symptoms of bowel incontinence, and bloody stool. He denies emesis, and abdominal pains.  Past Medical History  Diagnosis Date  . COPD (chronic obstructive pulmonary disease) ([place])   . Hypertension   . High cholesterol   . S/P AAA repair   . Renal calculi 01/14/2016  . New onset atrial fibrillation ([place])   . Bladder outlet obstruction 01/14/2016  . Atherosclerosis   . Tobacco abuse    Past Surgical History  Procedure Laterality Date  . Cholecystectomy    . Abdominal aortic aneurysm repair     History reviewed. No pertinent family history. Social History  Substance Use Topics  . Smoking status: Current Every Day Smoker -- 1.00 packs/day    Types: Cigarettes    Start date: 08/30/1960  . Smokeless tobacco: None  . Alcohol Use: 0.0 oz/week    0 Standard drinks or equivalent per week     Comment: "just a couple of swallows  of wine every night"    Review of Systems  Constitutional: Negative for appetite change and fatigue.  HENT: Negative for congestion, ear discharge and sinus pressure.   Eyes: Negative for discharge.  Respiratory: Negative for cough.   Cardiovascular: Negative for chest pain.  Gastrointestinal: Positive for diarrhea and blood in stool. Negative for vomiting and abdominal pain.  Genitourinary: Negative for frequency and hematuria.  Musculoskeletal: Negative for back pain.  Skin: Negative for rash.  Neurological: Negative for seizures and headaches.  Psychiatric/Behavioral: Negative for hallucinations.    Allergies  Review of patient's allergies indicates no known allergies.  Home Medications   Prior to Admission medications   Medication Sig Start Date End Date Taking? Authorizing Provider  acetaminophen (TYLENOL) 500 MG tablet Take 500 mg by mouth every 6 (six) hours as needed for mild pain or moderate pain.    Historical Provider, MD  aspirin EC 81 MG tablet Take 81 mg by mouth daily.    Historical Provider, MD  digoxin (LANOXIN) 0.125 MG tablet Take 1 tablet (0.125 mg total) by mouth daily. 01/17/16   Venetia Maxon Rama, MD  Multiple Vitamins-Minerals (MULTIVITAMINS THER. W/MINERALS) TABS tablet Take 1 tablet by mouth daily. 11/30/14   Historical Provider, MD  nicotine (NICODERM CQ - DOSED IN MG/24 HOURS) 14 mg/24hr patch Place 1 patch (14 mg total) onto the skin daily. 01/17/16   Venetia Maxon Rama, MD  OXYGEN Inhale 2 L into the lungs at bedtime.  Historical Provider, MD  propranolol (INDERAL) 20 MG tablet Take 1.5 tablets (30 mg total) by mouth 3 (three) times daily. 01/21/16   Imogene Burn, PA-C  rivaroxaban (XARELTO) 20 MG TABS tablet Take 1 tablet (20 mg total) by mouth daily with supper. 01/17/16   Christina P Rama, MD  simvastatin (ZOCOR) 40 MG tablet Take 1 tablet by mouth daily. 12/01/14   Historical Provider, MD  tamsulosin (FLOMAX) 0.4 MG CAPS capsule Take 1 capsule (0.4 mg  total) by mouth daily. 01/17/16   Christina P Rama, MD   BP 101/63 mmHg  Pulse 122  Temp(Src) 98.1 F (36.7 C) (Oral)  Resp 18  Ht 5\' 4"  (1.626 m)  Wt 117 lb (53.071 kg)  BMI 20.07 kg/m2  SpO2 100% Physical Exam  Constitutional: He is oriented to person, place, and time. He appears well-developed.  HENT:  Head: Normocephalic.  Eyes: Conjunctivae and EOM are normal. No scleral icterus.  Sclera is pale  Neck: Neck supple. No thyromegaly present.  Cardiovascular: Exam reveals no gallop and no friction rub.   No murmur heard. Rapid irregular heart beat  Pulmonary/Chest: No stridor. He has no wheezes. He has no rales. He exhibits no tenderness.  Abdominal: He exhibits no distension. There is no tenderness. There is no rebound.  Genitourinary:  Pt has foley  Musculoskeletal: Normal range of motion. He exhibits no edema.  Lymphadenopathy:    He has no cervical adenopathy.  Neurological: He is oriented to person, place, and time. He exhibits normal muscle tone. Coordination normal.  Skin: No rash noted. No erythema.  Psychiatric: He has a normal mood and affect. His behavior is normal.    ED Course  Procedures  DIAGNOSTIC STUDIES: Oxygen Saturation is 100% on Ra, NL by my interpretation.    COORDINATION OF CARE: 8:42 AM Discussed treatment plan with pt at bedside and pt agreed to plan.  Labs Review Labs Reviewed  LIPASE, BLOOD - Abnormal; Notable for the following:    Lipase 58 (*)    All other components within normal limits  COMPREHENSIVE METABOLIC PANEL - Abnormal; Notable for the following:    Sodium 133 (*)    Chloride 100 (*)    Glucose, Bld 146 (*)    BUN 26 (*)    Calcium 8.2 (*)    Total Protein 5.5 (*)    Albumin 2.5 (*)    All other components within normal limits  CBC - Abnormal; Notable for the following:    WBC 23.5 (*)    RBC 2.64 (*)    Hemoglobin 7.4 (*)    HCT 23.4 (*)    RDW 15.9 (*)    Platelets 450 (*)    All other components within normal  limits  URINALYSIS, ROUTINE W REFLEX MICROSCOPIC (NOT AT [facility]) - Abnormal; Notable for the following:    APPearance HAZY (*)    Hgb urine dipstick LARGE (*)    Bilirubin Urine SMALL (*)    Protein, ur 30 (*)    Leukocytes, UA TRACE (*)    All other components within normal limits  URINE MICROSCOPIC-ADD ON - Abnormal; Notable for the following:    Bacteria, UA MANY (*)    All other components within normal limits  C DIFFICILE QUICK SCREEN W PCR REFLEX   Image Review: Dg Abd Acute W/chest  01/25/2016  CLINICAL DATA:  Diarrhea.  Abdominal pain.  Weakness. EXAM: DG ABDOMEN ACUTE W/ 1V CHEST COMPARISON:  01/13/2016. FINDINGS: Borderline enlarged cardiac silhouette. Clear  lungs. Normal bowel gas pattern without free peritoneal air. Cholecystectomy clips. 6 mm nonspecific oval calcification overlying the right lower abdomen medially. Marked dextroconvex lumbar rotary scoliosis and degenerative changes. Mild to moderate thoracic scoliosis and degenerative changes. Mild left shoulder degenerative changes. Diffuse osteopenia. Atheromatous arterial calcifications. IMPRESSION: 1. No acute abnormality. 2. 6 mm oval calcification overlying the right lower abdomen, medially. This could potentially represent a ureteral calculus or phlebolith. An appendicolith is less likely. 3. Thoracolumbar scoliosis and degenerative changes. Electronically Signed   By: Claudie Revering M.D.   On: 01/25/2016 10:18    I have personally reviewed and evaluated these images and lab results as part of my medical decision-making. CRITICAL CARE Performed by: Amerah Puleo L Total critical care time: 45 minutes Critical care time was exclusive of separately billable procedures and treating other patients. Critical care was necessary to treat or prevent imminent or life-threatening deterioration. Critical care was time spent personally by me on the following activities: development of treatment plan with patient and/or surrogate as  well as nursing, discussions with consultants, evaluation of patient's response to treatment, examination of patient, obtaining history from patient or surrogate, ordering and performing treatments and interventions, ordering and review of laboratory studies, ordering and review of radiographic studies, pulse oximetry and re-evaluation of patient's condition.  MDM   Final diagnoses:  None    Patient had a large bloody stool while in the emergency department. Patient having rectal bleeding with tachycardia and blood pressure. Patient has hemoglobin 7.4. Patient will be given 2 units of blood and given Kcentra to help with those were also. He will be admitted to stepdown and GI is consulted  Milton Ferguson, MD 01/25/16 [phone]

## 2016-01-25 NOTE — Consult Note (Signed)
Referring Provider: No ref. provider found Primary Care Physician:  Alonza Bogus, MD Primary Gastroenterologist:  Dr. Gala Romney  Reason for Consultation:  GI bleed  HPI:    Steve 75 year old Gomez admitted to the ICU this afternoon with the 24-hour history of dark maroon/ bloody bowel movements. Maroon stool in ED. Hemoglobin 7.4. Xarelto reversed with Kcentra.  Patient has not had any nausea or vomiting. Remains hemodynamically stable. 2 units of packed red blood cells ordered.  Recently started on Xarelto for new diagnosis of atrial fibrillation. Admitted here previously and transferred to [facility] about a week and a half ago for abdominal pain and a CT which demonstrated air in the left portal venous system. He was followed by surgery down there. Clinically, he improved on antibiotics.  No explanation for portal venous gas.  Wife says shortly after returning home from the prior hospitalization,  he developed nonbloody watery diarrhea for days up until the past 24 hours when he started passing blood. Of note, his Clostridium difficile antigen is positive and his toxin is negative today. He has a significant leukocytosis with a white count about 24,000.  No history of prior GI bleeding, peptic ulcer disease, etc. Distant screening colonoscopy done up in [place]. Patient has lost an undefined amount of weight over the past several months according to family. He has frequent episodes of postprandial abdominal pain for which he tends to eat less.  I reviewed his prior CT on May 14 with Dr. Polly Cobia today. No evidence of aortoenteric fistula (status post repair of AAA).  Significant mesenteric atherosclerotic disease but no critical stenosis appreciated. Again, air was confirmed in the left portal venous system. In addition, retrospectively, there appears to be a segment of abnormal small bowel with pneumatosis present.  Obstructing right renal calculus also noted. Plain films this  admission:  probable right renal calculus. No bowel obstruction or free air.   Past Medical History  Diagnosis Date  . COPD (chronic obstructive pulmonary disease) ([place])   . Hypertension   . High cholesterol   . S/P AAA repair   . Renal calculi 01/14/2016  . New onset atrial fibrillation ([place])   . Bladder outlet obstruction 01/14/2016  . Atherosclerosis   . Tobacco abuse     Past Surgical History  Procedure Laterality Date  . Cholecystectomy    . Abdominal aortic aneurysm repair      Prior to Admission medications   Medication Sig Start Date End Date Taking? Authorizing Provider  acetaminophen (TYLENOL) 500 MG tablet Take 500 mg by mouth every 6 (six) hours as needed for mild pain or moderate pain.   Yes Historical Provider, MD  aspirin EC 81 MG tablet Take 81 mg by mouth daily.   Yes Historical Provider, MD  digoxin (LANOXIN) 0.125 MG tablet Take 1 tablet (0.125 mg total) by mouth daily. 01/17/16  Yes Venetia Maxon Rama, MD  Multiple Vitamins-Minerals (MULTIVITAMINS THER. W/MINERALS) TABS tablet Take 1 tablet by mouth daily. 11/30/14  Yes Historical Provider, MD  OXYGEN Inhale 2 L into the lungs at bedtime.   Yes Historical Provider, MD  propranolol (INDERAL) 20 MG tablet Take 1.5 tablets (30 mg total) by mouth 3 (three) times daily. 01/21/16  Yes Imogene Burn, PA-C  rivaroxaban (XARELTO) 20 MG TABS tablet Take 1 tablet (20 mg total) by mouth daily with supper. 01/17/16  Yes Christina P Rama, MD  simvastatin (ZOCOR) 40 MG tablet Take 1 tablet by mouth daily. 12/01/14  Yes Historical Provider, MD  tamsulosin (FLOMAX) 0.4 MG CAPS capsule Take 1 capsule (0.4 mg total) by mouth daily. 01/17/16  Yes Christina P Rama, MD  nicotine (NICODERM CQ - DOSED IN MG/24 HOURS) 14 mg/24hr patch Place 1 patch (14 mg total) onto the skin daily. Patient not taking: Reported on 01/25/2016 01/17/16   Venetia Maxon Rama, MD    Current Facility-Administered Medications  Medication Dose Route Frequency Provider  Last Rate Last Dose  . 0.9 %  sodium chloride infusion   Intravenous Continuous Donne Hazel, MD 100 mL/hr at 01/25/16 1700    . acetaminophen (TYLENOL) tablet 650 mg  650 mg Oral Q6H PRN Donne Hazel, MD       Or  . acetaminophen (TYLENOL) suppository 650 mg  650 mg Rectal Q6H PRN Donne Hazel, MD      . antiseptic oral rinse (CPC / CETYLPYRIDINIUM CHLORIDE 0.05%) solution 7 mL  7 mL Mouth Rinse BID Sinda Du, MD   7 mL at 01/25/16 1400  . morphine 2 MG/ML injection 2 mg  2 mg Intravenous Q4H PRN Donne Hazel, MD      . ondansetron [facility]) tablet 4 mg  4 mg Oral Q6H PRN Donne Hazel, MD       Or  . ondansetron [facility]) injection 4 mg  4 mg Intravenous Q6H PRN Donne Hazel, MD      . sodium chloride flush (NS) 0.9 % injection 3 mL  3 mL Intravenous Q12H Donne Hazel, MD   3 mL at 01/25/16 1315    Allergies as of 01/25/2016  . (No Known Allergies)    History reviewed. No pertinent family history.  Social History   Social History  . Marital Status: Married    Spouse Name: N/A  . Number of Children: N/A  . Years of Education: N/A   Occupational History  . Not on file.   Social History Main Topics  . Smoking status: Current Every Day Smoker -- 1.00 packs/day    Types: Cigarettes    Start date: 08/30/1960  . Smokeless tobacco: Not on file  . Alcohol Use: 0.0 oz/week    0 Standard drinks or equivalent per week     Comment: "just a couple of swallows of wine every night"  . Drug Use: No  . Sexual Activity: Not on file   Other Topics Concern  . Not on file   Social History Narrative    Review of Systems: As in history of present illness  Physical Exam: Vital signs in last 24 hours: Temp:  [98.1 F (36.7 C)-98.8 F (37.1 C)] 98.8 F (37.1 C) (05/28 1650) Pulse Rate:  [57-122] 72 (05/28 1700) Resp:  [17-23] 22 (05/28 1700) BP: (88-124)/(62-78) 124/77 mmHg (05/28 1700) SpO2:  [98 %-100 %] 99 % (05/28 1700) Weight:  [112 lb 3.4 oz (50.9 kg)-117 lb  (53.071 kg)] 112 lb 3.4 oz (50.9 kg) (05/28 1330) Last BM Date: 01/25/16 (bloody stools ) General:   Awake, Steve and cooperative in NAD. Appears in no acute distress. Heart appearing. Eyes:  Sclera clear, no icterus.   Conjunctiva pale Neck:  Supple; no masses or thyromegaly. Lungs:  Clear throughout to auscultation.   No wheezes, crackles, or rhonchi. No acute distress. Heart: Irregularly irregular rhythm without murmur gallop or rub Abdomen:  Nondistended. Positive bowel sounds. Soft, nontender without appreciable mass or organomegaly   Intake/Output from previous day:   Intake/Output this shift: Total I/O In: 1435 [I.V.:1000; Blood:335; IV Piggyback:100] Out: -  Lab Results:  Recent Labs  01/25/16 0850  WBC 23.5*  HGB 7.4*  HCT 23.4*  PLT 450*   BMET  Recent Labs  01/25/16 0850  NA 133*  K 4.2  CL 100*  CO2 24  GLUCOSE 146*  BUN 26*  CREATININE 0.85  CALCIUM 8.2*   LFT  Recent Labs  01/25/16 0850  PROT 5.5*  ALBUMIN 2.5*  AST 29  ALT 23  ALKPHOS 109  BILITOT 0.4   PT/INR  Recent Labs  01/25/16 1215  LABPROT 25.9*  INR 2.40*  Studies/Results: Dg Abd Acute W/chest  01/25/2016  CLINICAL DATA:  Diarrhea.  Abdominal pain.  Weakness. EXAM: DG ABDOMEN ACUTE W/ 1V CHEST COMPARISON:  01/13/2016. FINDINGS: Borderline enlarged cardiac silhouette. Clear lungs. Normal bowel gas pattern without free peritoneal air. Cholecystectomy clips. 6 mm nonspecific oval calcification overlying the right lower abdomen medially. Marked dextroconvex lumbar rotary scoliosis and degenerative changes. Mild to moderate thoracic scoliosis and degenerative changes. Mild left shoulder degenerative changes. Diffuse osteopenia. Atheromatous arterial calcifications. IMPRESSION: 1. No acute abnormality. 2. 6 mm oval calcification overlying the right lower abdomen, medially. This could potentially represent a ureteral calculus or phlebolith. An appendicolith is less likely. 3.  Thoracolumbar scoliosis and degenerative changes. Electronically Signed   By: Claudie Revering M.D.   On: 01/25/2016 10:18   Impression:  Steve Gomez admitted to the ICU with a hemodynamically significant GI bleed in the setting of recently initiated anticoagulation therapy for new onset atrial fibrillation. Anticoagulation therapy is being reversed with Kcentra.  Transfusion with 2 units of packed red blood cells underway.  Etiology of bleeding not clear. It could be emanating from his colon, small bowel or even upper GI tract.  Area of pneumatosis in the small bowel seen on the prior CT would be suspect.  He currently has a paucity of abdominal pain and his abdominal exam is fairly benign at this time.  No evidence of aortoenteric fistula on prior CT. He does have significant mesenteric atherosclerosis.  Patient has lost weight over the past several months and wife describes intermittent postprandial abdominal pain.  .  Obstructing right renal calculi likely explains hematuria and recent back pain    Recommendations:  Agree with blood/fluid resuscitation. Agree with PPI empirically.  Patient needs a repeat CT of the abdomen/pelvis this evening.  We'll check serum lactic acid level.   Eventual endoscopic evaluation.  In the clinical context of days of non-bloody watery diarrhea, leukocytosis, recent hospitalization along with antibiotic exposure, I recommend patient be treated for clostridium difficile infection even though toxin assay negative.   I have discussed my findings including prior CT scan with Dr. Wyline Copas via telephone this evening.  Further recommendations to follow.      Notice:  This dictation was prepared with Dragon dictation along with smaller phrase technology. Any transcriptional errors that result from this process are unintentional and may not be corrected upon review.

## 2016-01-26 DIAGNOSIS — E43 Unspecified severe protein-calorie malnutrition: Secondary | ICD-10-CM | POA: Insufficient documentation

## 2016-01-26 DIAGNOSIS — K625 Hemorrhage of anus and rectum: Secondary | ICD-10-CM

## 2016-01-26 DIAGNOSIS — I4891 Unspecified atrial fibrillation: Secondary | ICD-10-CM

## 2016-01-26 LAB — COMPREHENSIVE METABOLIC PANEL
ALBUMIN: 2.3 g/dL — AB (ref 3.5–5.0)
ALT: 17 U/L (ref 17–63)
ANION GAP: 4 — AB (ref 5–15)
AST: 21 U/L (ref 15–41)
Alkaline Phosphatase: 97 U/L (ref 38–126)
BILIRUBIN TOTAL: 0.6 mg/dL (ref 0.3–1.2)
BUN: 20 mg/dL (ref 6–20)
CO2: 25 mmol/L (ref 22–32)
Calcium: 7.5 mg/dL — ABNORMAL LOW (ref 8.9–10.3)
Chloride: 109 mmol/L (ref 101–111)
Creatinine, Ser: 0.59 mg/dL — ABNORMAL LOW (ref 0.61–1.24)
Glucose, Bld: 76 mg/dL (ref 65–99)
POTASSIUM: 3 mmol/L — AB (ref 3.5–5.1)
Sodium: 138 mmol/L (ref 135–145)
TOTAL PROTEIN: 4.8 g/dL — AB (ref 6.5–8.1)

## 2016-01-26 LAB — CBC
HCT: 29.7 % — ABNORMAL LOW (ref 39.0–52.0)
HEMATOCRIT: 27.4 % — AB (ref 39.0–52.0)
HEMOGLOBIN: 9 g/dL — AB (ref 13.0–17.0)
Hemoglobin: 9.4 g/dL — ABNORMAL LOW (ref 13.0–17.0)
MCH: 28 pg (ref 26.0–34.0)
MCH: 27.3 pg (ref 26.0–34.0)
MCHC: 32.8 g/dL (ref 30.0–36.0)
MCHC: 31.6 g/dL (ref 30.0–36.0)
MCV: 85.4 fL (ref 78.0–100.0)
MCV: 86.3 fL (ref 78.0–100.0)
PLATELETS: 376 10*3/uL (ref 150–400)
Platelets: 333 10*3/uL (ref 150–400)
RBC: 3.21 MIL/uL — AB (ref 4.22–5.81)
RBC: 3.44 MIL/uL — AB (ref 4.22–5.81)
RDW: 17.1 % — ABNORMAL HIGH (ref 11.5–15.5)
RDW: 17.4 % — AB (ref 11.5–15.5)
WBC: 29.5 10*3/uL — AB (ref 4.0–10.5)
WBC: 31.1 10*3/uL — AB (ref 4.0–10.5)

## 2016-01-26 LAB — TYPE AND SCREEN
ABO/RH(D): O POS
ANTIBODY SCREEN: NEGATIVE
Unit division: 0
Unit division: 0

## 2016-01-26 MED ORDER — METOPROLOL TARTRATE 5 MG/5ML IV SOLN
5.0000 mg | Freq: Four times a day (QID) | INTRAVENOUS | Status: DC
  Administered 2016-01-26 – 2016-01-27 (×4): 5 mg via INTRAVENOUS
  Filled 2016-01-26 (×4): qty 5

## 2016-01-26 MED ORDER — SODIUM CHLORIDE 0.9 % IV BOLUS (SEPSIS)
500.0000 mL | Freq: Once | INTRAVENOUS | Status: AC
  Administered 2016-01-26: 500 mL via INTRAVENOUS

## 2016-01-26 MED ORDER — POTASSIUM CHLORIDE 10 MEQ/100ML IV SOLN
10.0000 meq | INTRAVENOUS | Status: AC
  Administered 2016-01-26 (×6): 10 meq via INTRAVENOUS
  Filled 2016-01-26 (×6): qty 100

## 2016-01-26 MED ORDER — PEG 3350-KCL-NA BICARB-NACL 420 G PO SOLR
2000.0000 mL | Freq: Once | ORAL | Status: AC
  Administered 2016-01-26: 2000 mL via ORAL
  Filled 2016-01-26: qty 4000

## 2016-01-26 MED ORDER — PEG 3350-KCL-NA BICARB-NACL 420 G PO SOLR
2000.0000 mL | Freq: Once | ORAL | Status: AC
  Administered 2016-01-27: 2000 mL via ORAL

## 2016-01-26 MED ORDER — SODIUM CHLORIDE 0.9 % IV SOLN: INTRAVENOUS | Status: DC

## 2016-01-26 MED ORDER — FLEET ENEMA 7-19 GM/118ML RE ENEM
2.0000 | ENEMA | Freq: Two times a day (BID) | RECTAL | Status: AC
  Administered 2016-01-27 (×2): 2 via RECTAL

## 2016-01-26 NOTE — Progress Notes (Signed)
Pt tachycardic despite evening metoprolol dose.  MD notified.  Orders given to obtain CBC and give 500 mL bolus after the lab draw.  Nursing staff will call results to MD.

## 2016-01-26 NOTE — Progress Notes (Signed)
Initial Nutrition Assessment  DOCUMENTATION CODES:   Severe malnutrition in context of chronic illness, Underweight  INTERVENTION:   When diet advanced, order Ensure Enlive TID. Each supplement provides 350 kcals and 20 grams of protein.  NUTRITION DIAGNOSIS:   Malnutrition related to chronic illness as evidenced by severe depletion of body fat, severe depletion of muscle mass, percent weight loss (15% in 6 months).  GOAL:   Patient will meet greater than or equal to 90% of their needs  MONITOR:   PO intake, Supplement acceptance, Diet advancement, Labs, Weight trends, Skin, I & O's  REASON FOR ASSESSMENT:   Malnutrition Screening Tool    ASSESSMENT:   Pt  with medical history significant of COPD, hypertension, hypercholesterolemia, recently diagnosed atrial fibrillation on her alto who presents to the emergency department with complaints of dark looking diarrhea. Patient was recently discharged from [facility] on 01/17/2016, admitted for workup of of abdominal pain with unclear etiology. Patient was discharged home on by mouth Ceftin. During this time, patient was also found to have new onset atrial fibrillation improved with beta blocker. Patient was transitioned from heparin to Xarelto on 01/16/2016.  Pt reports eating BID-TID. He eats small meals. Pt's wife at bedside states she is always encouraging him to eat more. Pt reports poor appetite for over 6 months. Pt does not have chronic N/V but does vomit occasionally for unknown reason. Pt feels full after eating a small amount of food. His wife buys [employer] brand protein drink supplement and he drank them until a week ago when he began having GI issues.  Pt amenable to receiving Ensure. Currently NPO but will monitor for diet advancement and order Ensure TID once appropriate.   Pt has experienced a 15% weight loss in the past 6 months. This is significant for time frame. NFPE reveals severe body fat and muscle mass  depletion, no edema.   Labs reviewed; K 3.0, creat .59, Ca 7.5. Meds reviewed.  Diet Order:  Diet NPO time specified Except for: Sips with Meds  Skin:  Reviewed, no issues  Last BM:  5/28  Height:   Ht Readings from Last 1 Encounters:  01/25/16 5\' 4"  (1.626 m)    Weight:   Wt Readings from Last 1 Encounters:  01/26/16 111 lb 15.9 oz (50.8 kg)    Ideal Body Weight:  59 kg  BMI:  Body mass index is 19.21 kg/(m^2).  Estimated Nutritional Needs:   Kcal:  1350-1550  Protein:  70-80 g  Fluid:  1.5 L  EDUCATION NEEDS:   No education needs identified at this time  Geoffery Lyons, [place] Dietetic Intern Pager [phone]

## 2016-01-26 NOTE — Care Management Important Message (Signed)
Important Message  Patient Details  Name: Steve Gomez MRN: [id number] Date of Birth: October 06, 1940   Medicare Important Message Given:  Yes    Sherald Barge, RN 01/26/2016, 8:25 AM

## 2016-01-26 NOTE — Progress Notes (Signed)
Steve Gomez [id number] DOB: 09/21/1940 DOA: 01/25/2016 PCP: Alonza Bogus, MD   Subjective: Patient still continues to have intermittent maroon-colored stools. No nausea or vomiting. No abdominal pain. Also, he was noted to have a tachycardia this morning, probably atrial fibrillation. He has recently been diagnosed with atrial fibrillation last few weeks.           Physical Exam: Blood pressure 112/72, pulse 117, temperature 98.3 F (36.8 C), temperature source Oral, resp. rate 15, height 5\' 4"  (1.626 m), weight 50.8 kg (111 lb 15.9 oz), SpO2 95 %. He looks systemically well. His hemodynamic stable. He is slightly pale. Ventricular rate is 120 to 130 and appears to be in atrial fibrillation. His abdomen is soft and nontender. Lung fields are clear. He is alert and orientated.   Investigations:  Recent Results (from the past 240 hour(s))  MRSA PCR Screening     Status: None   Collection Time: 01/25/16  1:40 PM  Result Value Ref Range Status   MRSA by PCR NEGATIVE NEGATIVE Final    Comment:        The GeneXpert MRSA Assay (FDA approved for NASAL specimens only), is one component of a comprehensive MRSA colonization surveillance program. It is not intended to diagnose MRSA infection nor to guide or monitor treatment for MRSA infections.   C difficile quick scan w PCR reflex     Status: Abnormal   Collection Time: 01/25/16  2:00 PM  Result Value Ref Range Status   C Diff antigen POSITIVE (A) NEGATIVE Final   C Diff toxin NEGATIVE NEGATIVE Final   C Diff interpretation   Final    C. difficile present, but toxin not detected. This indicates colonization. In most cases, this does not require treatment. If patient has signs and symptoms consistent with colitis, consider treatment. Requires ENTERIC precautions.     Basic Metabolic Panel:  Recent Labs  01/25/16 0850 01/26/16 0443  NA 133* 138  K 4.2 3.0*  CL 100* 109  CO2 24 25  GLUCOSE 146* 76  BUN 26*  20  CREATININE 0.85 0.59*  CALCIUM 8.2* 7.5*   Liver Function Tests:  Recent Labs  01/25/16 0850 01/26/16 0443  AST 29 21  ALT 23 17  ALKPHOS 109 97  BILITOT 0.4 0.6  PROT 5.5* 4.8*  ALBUMIN 2.5* 2.3*     CBC:  Recent Labs  01/25/16 1844 01/26/16 0443  WBC 27.7* 29.5*  HGB 9.0* 9.0*  HCT 27.3* 27.4*  MCV 85.3 85.4  PLT 329 333    Ct Abdomen Pelvis W Wo Contrast  01/25/2016  CLINICAL DATA:  GI bleeding, prior portal venous gas on previous CT EXAM: CT ABDOMEN AND PELVIS WITHOUT AND WITH CONTRAST TECHNIQUE: Multidetector CT imaging of the abdomen and pelvis was performed following the standard protocol before and following the bolus administration of intravenous contrast. CONTRAST:  170mL ISOVUE-300 IOPAMIDOL (ISOVUE-300) INJECTION 61% COMPARISON:  01/11/2016 FINDINGS: Lung bases are free of acute infiltrate or sizable effusion. Gallbladder has been surgically removed. The previously seen portal venous air within the left lobe of the liver is no longer identified. Portal vein is well visualized and demonstrates a normal branching pattern. No portal venous thrombosis is noted. Again no portal venous air is seen. The spleen, right adrenal gland and pancreas are stable in appearance from the prior exam. Small duodenal diverticulum is seen. Left adrenal gland demonstrates some hypertrophy but stable from the prior study. The kidneys are stable in appearance with cystic change and  nonobstructing renal calculi bilaterally. The bladder is decompressed by Foley catheter. Prostatic calcifications are seen. No free pelvic fluid is noted. Mild diverticular change is noted without evidence of diverticulitis. No pooling of contrast to correspond with the given clinical history of GI bleeding is noted. Aortoiliac calcifications are seen. Tortuosity of the abdominal aorta is noted. Tube graft is noted within the distal aorta related to prior surgical repair. This is stable from the prior exam. The  appendix is not well visualized although no inflammatory changes are seen. The osseous structures show a scoliosis of the lumbar spine concave to the left. This is stable from the prior exam. No acute bony abnormality is noted. IMPRESSION: Resolution of previously seen portal venous gas. No findings to correspond with the patient's given clinical history of active GI bleeding are seen. Direct visualization may be helpful. Nuclear bleeding scan may also be helpful for localization Remainder of the exam is stable from the prior study with evidence of bilateral renal calculi and right renal cystic change without obstructive change. Chronic changes as described. Electronically Signed   By: Inez Catalina M.D.   On: 01/25/2016 19:48   Dg Abd Acute W/chest  01/25/2016  CLINICAL DATA:  Diarrhea.  Abdominal pain.  Weakness. EXAM: DG ABDOMEN ACUTE W/ 1V CHEST COMPARISON:  01/13/2016. FINDINGS: Borderline enlarged cardiac silhouette. Clear lungs. Normal bowel gas pattern without free peritoneal air. Cholecystectomy clips. 6 mm nonspecific oval calcification overlying the right lower abdomen medially. Marked dextroconvex lumbar rotary scoliosis and degenerative changes. Mild to moderate thoracic scoliosis and degenerative changes. Mild left shoulder degenerative changes. Diffuse osteopenia. Atheromatous arterial calcifications. IMPRESSION: 1. No acute abnormality. 2. 6 mm oval calcification overlying the right lower abdomen, medially. This could potentially represent a ureteral calculus or phlebolith. An appendicolith is less likely. 3. Thoracolumbar scoliosis and degenerative changes. Electronically Signed   By: Claudie Revering M.D.   On: 01/25/2016 10:18      Medications: I have reviewed the patient's current medications.  Impression: 1. Acute blood loss anemia. Hemoglobin is stable. Await endoscopy tomorrow. 2. Atrial fibrillation with rapid ventricular response. His home medications included digoxin and beta  blocker. I will use intravenous metoprolol for now and monitor closely. We may also need to use digoxin. Obtain ECG. 3. Possible C. difficile colitis. Patient is on oral vancomycin.       Consultants:  Gastroenterology.   Procedures:  None.   Antibiotics:  Oral vancomycin                   Code Status: Full code.  Family Communication: I discussed the plan with the patient and patient's wife at the bedside.   Disposition Plan: Home when medically stable.  Time spent: 15 minutes.   LOS: 1 day   [place] C   01/26/2016, 11:42 AM

## 2016-01-26 NOTE — Progress Notes (Signed)
Patient remains hemodynamically stable in the ICU. Hemoglobin 9 this morning after 2 units of packed red blood cells; continues to pass intermittent maroon stools.  No hematemesis.  Lactic acid normal. CT last evening demonstrated no evidence of abscess, free air pneumatosis or portal venous gas-improved over prior study  Oral vancomycin started last evening.  Patient absolutely denies any abdominal pain.     Vital signs in last 24 hours: Temp:  [98.1 F (36.7 C)-98.8 F (37.1 C)] 98.3 F (36.8 C) (05/29 0745) Pulse Rate:  [45-125] 117 (05/29 0400) Resp:  [15-24] 15 (05/29 0400) BP: (88-124)/(56-81) 112/72 mmHg (05/29 0400) SpO2:  [93 %-100 %] 95 % (05/29 0400) Weight:  [111 lb 15.9 oz (50.8 kg)-112 lb 3.4 oz (50.9 kg)] 111 lb 15.9 oz (50.8 kg) (05/29 0458) Last BM Date: 01/25/16 General:   Alert,  pleasant and cooperative in NAD. Hard of hearing  Abdomen:  Soft, nontender and nondistended.  Normal bowel sounds, without guarding, and without rebound.  No mass or organomegaly. Extremities:  Without clubbing or edema.    Intake/Output from previous day: 05/28 0701 - 05/29 0700 In: 2220 [I.V.:1100; Blood:670; IV Piggyback:100] Out: 900 [Urine:900] Intake/Output this shift:    Lab Results:  Recent Labs  01/25/16 0850 01/25/16 1844 01/26/16 0443  WBC 23.5* 27.7* 29.5*  HGB 7.4* 9.0* 9.0*  HCT 23.4* 27.3* 27.4*  PLT 450* 329 333   BMET  Recent Labs  01/25/16 0850 01/26/16 0443  NA 133* 138  K 4.2 3.0*  CL 100* 109  CO2 24 25  GLUCOSE 146* 76  BUN 26* 20  CREATININE 0.85 0.59*  CALCIUM 8.2* 7.5*   LFT  Recent Labs  01/26/16 0443  PROT 4.8*  ALBUMIN 2.3*  AST 21  ALT 17  ALKPHOS 97  BILITOT 0.6   PT/INR  Recent Labs  01/25/16 1215  LABPROT 25.9*  INR 2.40*   Hepatitis Panel No results for input(s): HEPBSAG, HCVAB, HEPAIGM, HEPBIGM in the last 72 hours. C-Diff  Recent Labs  01/25/16 1400  CDIFFTOX NEGATIVE    Studies/Results: Ct  Abdomen Pelvis W Wo Contrast  01/25/2016  CLINICAL DATA:  GI bleeding, prior portal venous gas on previous CT EXAM: CT ABDOMEN AND PELVIS WITHOUT AND WITH CONTRAST TECHNIQUE: Multidetector CT imaging of the abdomen and pelvis was performed following the standard protocol before and following the bolus administration of intravenous contrast. CONTRAST:  158mL ISOVUE-300 IOPAMIDOL (ISOVUE-300) INJECTION 61% COMPARISON:  01/11/2016 FINDINGS: Lung bases are free of acute infiltrate or sizable effusion. Gallbladder has been surgically removed. The previously seen portal venous air within the left lobe of the liver is no longer identified. Portal vein is well visualized and demonstrates a normal branching pattern. No portal venous thrombosis is noted. Again no portal venous air is seen. The spleen, right adrenal gland and pancreas are stable in appearance from the prior exam. Small duodenal diverticulum is seen. Left adrenal gland demonstrates some hypertrophy but stable from the prior study. The kidneys are stable in appearance with cystic change and nonobstructing renal calculi bilaterally. The bladder is decompressed by Foley catheter. Prostatic calcifications are seen. No free pelvic fluid is noted. Mild diverticular change is noted without evidence of diverticulitis. No pooling of contrast to correspond with the given clinical history of GI bleeding is noted. Aortoiliac calcifications are seen. Tortuosity of the abdominal aorta is noted. Tube graft is noted within the distal aorta related to prior surgical repair. This is stable from the prior exam. The appendix  is not well visualized although no inflammatory changes are seen. The osseous structures show a scoliosis of the lumbar spine concave to the left. This is stable from the prior exam. No acute bony abnormality is noted. IMPRESSION: Resolution of previously seen portal venous gas. No findings to correspond with the patient's given clinical history of active GI  bleeding are seen. Direct visualization may be helpful. Nuclear bleeding scan may also be helpful for localization Remainder of the exam is stable from the prior study with evidence of bilateral renal calculi and right renal cystic change without obstructive change. Chronic changes as described. Electronically Signed   By: Inez Catalina M.D.   On: 01/25/2016 19:48   Dg Abd Acute W/chest  01/25/2016  CLINICAL DATA:  Diarrhea.  Abdominal pain.  Weakness. EXAM: DG ABDOMEN ACUTE W/ 1V CHEST COMPARISON:  01/13/2016. FINDINGS: Borderline enlarged cardiac silhouette. Clear lungs. Normal bowel gas pattern without free peritoneal air. Cholecystectomy clips. 6 mm nonspecific oval calcification overlying the right lower abdomen medially. Marked dextroconvex lumbar rotary scoliosis and degenerative changes. Mild to moderate thoracic scoliosis and degenerative changes. Mild left shoulder degenerative changes. Diffuse osteopenia. Atheromatous arterial calcifications. IMPRESSION: 1. No acute abnormality. 2. 6 mm oval calcification overlying the right lower abdomen, medially. This could potentially represent a ureteral calculus or phlebolith. An appendicolith is less likely. 3. Thoracolumbar scoliosis and degenerative changes. Electronically Signed   By: Claudie Revering M.D.   On: 01/25/2016 10:18     Impression:  GI bleed-stuttering, may be slowing. Recent anticoagulation therapy with Xarelto;  Kcentra given yesterday.  Significant leukocytosis and recent pneumatosis noted on prior CT-now not evident on current CT. Abdominal exam is entirely benign. C. difficile antigen positive in the setting of diarrhea following hospitalization and antibiotic exposure. Vancomycin started (no colitis on CT).  Leukocytosis could be due to C. difficile or urinary tract infection in the setting of the right obstructive uropathy.    Recommendations:  Continue to follow closely, H&H, etc. Begin colonoscopy preparation today. Plan for both  EGD and colonoscopy tomorrow. The risks, benefits, limitations, imponderables and alternatives regarding both EGD and colonoscopy have been reviewed with the patient. Questions have been answered. All parties agreeable.

## 2016-01-27 ENCOUNTER — Encounter (HOSPITAL_COMMUNITY): Payer: Self-pay | Admitting: Adult Health

## 2016-01-27 ENCOUNTER — Encounter (HOSPITAL_COMMUNITY)
Admission: EM | Disposition: A | Discharge status: Home Health | Payer: Self-pay | Source: Home / Self Care | Attending: Pulmonary Disease

## 2016-01-27 HISTORY — PX: ESOPHAGOGASTRODUODENOSCOPY: SHX5428

## 2016-01-27 HISTORY — PX: COLONOSCOPY: SHX5424

## 2016-01-27 LAB — COMPREHENSIVE METABOLIC PANEL
ALK PHOS: 82 U/L (ref 38–126)
ALT: 18 U/L (ref 17–63)
AST: 24 U/L (ref 15–41)
Albumin: 2.1 g/dL — ABNORMAL LOW (ref 3.5–5.0)
Anion gap: 5 (ref 5–15)
BUN: 18 mg/dL (ref 6–20)
CALCIUM: 7.4 mg/dL — AB (ref 8.9–10.3)
CHLORIDE: 111 mmol/L (ref 101–111)
CO2: 21 mmol/L — AB (ref 22–32)
CREATININE: 0.5 mg/dL — AB (ref 0.61–1.24)
GFR calc non Af Amer: >60 mL/min (ref >60)
Glucose, Bld: 67 mg/dL (ref 65–99)
Potassium: 3.3 mmol/L — ABNORMAL LOW (ref 3.5–5.1)
SODIUM: 137 mmol/L (ref 135–145)
Total Bilirubin: 0.8 mg/dL (ref 0.3–1.2)
Total Protein: 4.6 g/dL — ABNORMAL LOW (ref 6.5–8.1)

## 2016-01-27 LAB — CBC
HCT: 26.2 % — ABNORMAL LOW (ref 39.0–52.0)
HCT: 26.7 % — ABNORMAL LOW (ref 39.0–52.0)
HEMOGLOBIN: 8.5 g/dL — AB (ref 13.0–17.0)
Hemoglobin: 8.4 g/dL — ABNORMAL LOW (ref 13.0–17.0)
MCH: 27.6 pg (ref 26.0–34.0)
MCH: 27.2 pg (ref 26.0–34.0)
MCHC: 31.8 g/dL (ref 30.0–36.0)
MCHC: 32.1 g/dL (ref 30.0–36.0)
MCV: 86.2 fL (ref 78.0–100.0)
MCV: 85.6 fL (ref 78.0–100.0)
PLATELETS: 324 10*3/uL (ref 150–400)
PLATELETS: 314 10*3/uL (ref 150–400)
RBC: 3.04 MIL/uL — AB (ref 4.22–5.81)
RBC: 3.12 MIL/uL — AB (ref 4.22–5.81)
RDW: 17.1 % — AB (ref 11.5–15.5)
RDW: 17.3 % — ABNORMAL HIGH (ref 11.5–15.5)
WBC: 25.8 10*3/uL — ABNORMAL HIGH (ref 4.0–10.5)
WBC: 27 10*3/uL — AB (ref 4.0–10.5)

## 2016-01-27 LAB — GLUCOSE, CAPILLARY: GLUCOSE-CAPILLARY: 69 mg/dL (ref 65–99)

## 2016-01-27 SURGERY — EGD (ESOPHAGOGASTRODUODENOSCOPY)
Anesthesia: Moderate Sedation

## 2016-01-27 MED ORDER — MIDAZOLAM HCL 5 MG/5ML IJ SOLN
INTRAMUSCULAR | Status: AC
  Filled 2016-01-27: qty 10

## 2016-01-27 MED ORDER — MEPERIDINE HCL 100 MG/ML IJ SOLN
INTRAMUSCULAR | Status: DC | PRN
  Administered 2016-01-27 (×2): 25 mg via INTRAVENOUS

## 2016-01-27 MED ORDER — LIDOCAINE VISCOUS 2 % MT SOLN
OROMUCOSAL | Status: AC
Start: 2016-01-27 — End: 2016-01-28
  Filled 2016-01-27: qty 15

## 2016-01-27 MED ORDER — STERILE WATER FOR IRRIGATION IR SOLN
Status: DC | PRN
  Administered 2016-01-27: 14:00:00

## 2016-01-27 MED ORDER — METOPROLOL TARTRATE 5 MG/5ML IV SOLN: 10.0000 mg | Freq: Four times a day (QID) | INTRAVENOUS | Status: DC

## 2016-01-27 MED ORDER — ONDANSETRON HCL 4 MG/2ML IJ SOLN
INTRAMUSCULAR | Status: DC | PRN
  Administered 2016-01-27: 4 mg via INTRAVENOUS

## 2016-01-27 MED ORDER — MIDAZOLAM HCL 5 MG/5ML IJ SOLN
INTRAMUSCULAR | Status: DC | PRN
  Administered 2016-01-27 (×2): 1 mg via INTRAVENOUS

## 2016-01-27 MED ORDER — MEPERIDINE HCL 100 MG/ML IJ SOLN
INTRAMUSCULAR | Status: AC
  Filled 2016-01-27: qty 2

## 2016-01-27 MED ORDER — METOPROLOL TARTRATE 5 MG/5ML IV SOLN
5.0000 mg | Freq: Four times a day (QID) | INTRAVENOUS | Status: DC
  Administered 2016-01-27 – 2016-01-28 (×4): 5 mg via INTRAVENOUS
  Filled 2016-01-27 (×4): qty 5

## 2016-01-27 MED ORDER — DIGOXIN 0.25 MG/ML IJ SOLN
0.5000 mg | Freq: Once | INTRAMUSCULAR | Status: AC
  Administered 2016-01-27: 0.5 mg via INTRAVENOUS
  Filled 2016-01-27: qty 2

## 2016-01-27 MED ORDER — ONDANSETRON HCL 4 MG/2ML IJ SOLN
INTRAMUSCULAR | Status: AC
  Filled 2016-01-27: qty 2

## 2016-01-27 MED ORDER — LIDOCAINE VISCOUS 2 % MT SOLN
OROMUCOSAL | Status: DC | PRN
  Administered 2016-01-27: 6 mL via OROMUCOSAL

## 2016-01-27 NOTE — Progress Notes (Signed)
Dr Rosana Hoes in to see Pt.

## 2016-01-27 NOTE — Progress Notes (Signed)
Dr Marin Comment updated on pt hgb 9.4

## 2016-01-27 NOTE — Care Management Note (Signed)
Case Management Note  Patient Details  Name: Steve Gomez MRN: [id number] Date of Birth: [birth year]  Subjective/Objective:                  Pt admitted with GIB. Pt s from home, lives with his wife and is ind with ADL's. Pt ambulates with no assistive devices and does not own cane/walker/WC PTA. Pt has CPAP and home O2 for HS use only, pt has no port O2 tanks. Pt has PCP, drives himself to appointments and receives his mail at no cost through the mail. Pt plans to return home with self care at DC.   Action/Plan: No CM needs anticipated, will cont to follow.   Expected Discharge Date:    01/28/2016              Expected Discharge Plan:  Home/Self Care  In-House Referral:  NA  Discharge planning Services  CM Consult  Post Acute Care Choice:  NA Choice offered to:  NA  DME Arranged:    DME Agency:     HH Arranged:    HH Agency:     Status of Service:  In process, will continue to follow  Medicare Important Message Given:  Yes Date Medicare IM Given:    Medicare IM give by:    Date Additional Medicare IM Given:    Additional Medicare Important Message give by:     If discussed at [place] of Stay Meetings, dates discussed:    Additional Comments:  Sherald Barge, RN 01/27/2016, 11:02 AM

## 2016-01-27 NOTE — Progress Notes (Signed)
Evaluated briefly this morning.  Tolerated prep well. Overnight, issues with tachycardia. IV metoprolol on board. Digoxin added this morning. Cardiology consult requested and complete note pending. Patient in no distress. Wife at bedside. When I spoke with patient, heart rate was in the low 100s to mid 1teens (unsustained in 1teens). Further work-up per cardiology after GI evaluation completed. Limited medications available due to softer BPs. Most recent BP 106/56. Will continue with colonoscopy/EGD today unless clinical condition changes.   Orvil Feil, ANP-BC [facility]

## 2016-01-27 NOTE — Progress Notes (Signed)
Subjective: He was admitted with GI bleeding. He had recently been started on anticoagulation for new onset atrial fib. The events of the last several weeks have been noted. He continues tachycardic. He's been on metoprolol IV. His blood pressure has been somewhat soft. His hemoglobin level appears to be stable. He has not had any further bleeding  Objective: Vital signs in last 24 hours: Temp:  [97 F (36.1 C)-98.5 F (36.9 C)] 98.1 F (36.7 C) (05/30 0400) Pulse Rate:  [30-131] 124 (05/30 0613) Resp:  [16-37] 23 (05/30 0613) BP: (87-116)/(51-86) 106/56 mmHg (05/30 0600) SpO2:  [91 %-99 %] 94 % (05/30 0613) Weight:  [52.2 kg (115 lb 1.3 oz)] 52.2 kg (115 lb 1.3 oz) (05/30 0458) Weight change: -0.871 kg (-1 lb 14.7 oz) Last BM Date: 01/25/16  Intake/Output from previous day: 05/29 0701 - 05/30 0700 In: 6265 [P.O.:2000; I.V.:3665; IV Piggyback:600] Out: 5449 [Urine:1075]  PHYSICAL EXAM General appearance: He is pale. He looks fairly comfortable. Resp: clear to auscultation bilaterally Cardio: Irregularly irregular with still some tachycardia GI: soft, non-tender; bowel sounds normal; no masses,  no organomegaly Extremities: extremities normal, atraumatic, no cyanosis or edema  Lab Results:  Results for orders placed or performed during the hospital encounter of 01/25/16 (from the past 48 hour(s))  Lipase, blood     Status: Abnormal   Collection Time: 01/25/16  8:50 AM  Result Value Ref Range   Lipase 58 (H) 11 - 51 U/L  Comprehensive metabolic panel     Status: Abnormal   Collection Time: 01/25/16  8:50 AM  Result Value Ref Range   Sodium 133 (L) 135 - 145 mmol/L   Potassium 4.2 3.5 - 5.1 mmol/L   Chloride 100 (L) 101 - 111 mmol/L   CO2 24 22 - 32 mmol/L   Glucose, Bld 146 (H) 65 - 99 mg/dL   BUN 26 (H) 6 - 20 mg/dL   Creatinine, Ser 0.85 0.61 - 1.24 mg/dL   Calcium 8.2 (L) 8.9 - 10.3 mg/dL   Total Protein 5.5 (L) 6.5 - 8.1 g/dL   Albumin 2.5 (L) 3.5 - 5.0 g/dL   AST  29 15 - 41 U/L   ALT 23 17 - 63 U/L   Alkaline Phosphatase 109 38 - 126 U/L   Total Bilirubin 0.4 0.3 - 1.2 mg/dL   GFR calc non Af Amer >60 >60 mL/min   GFR calc Af Amer >60 >60 mL/min    Comment: (NOTE) The eGFR has been calculated using the CKD EPI equation. This calculation has not been validated in all clinical situations. eGFR's persistently <60 mL/min signify possible Chronic Kidney Disease.    Anion gap 9 5 - 15  CBC     Status: Abnormal   Collection Time: 01/25/16  8:50 AM  Result Value Ref Range   WBC 23.5 (H) 4.0 - 10.5 K/uL   RBC 2.64 (L) 4.22 - 5.81 MIL/uL   Hemoglobin 7.4 (L) 13.0 - 17.0 g/dL   HCT 23.4 (L) 39.0 - 52.0 %   MCV 88.6 78.0 - 100.0 fL   MCH 28.0 26.0 - 34.0 pg   MCHC 31.6 30.0 - 36.0 g/dL   RDW 15.9 (H) 11.5 - 15.5 %   Platelets 450 (H) 150 - 400 K/uL  Digoxin level     Status: Abnormal   Collection Time: 01/25/16  8:50 AM  Result Value Ref Range   Digoxin Level 0.5 (L) 0.8 - 2.0 ng/mL  Urinalysis, Routine w reflex microscopic  Status: Abnormal   Collection Time: 01/25/16  8:52 AM  Result Value Ref Range   Color, Urine YELLOW YELLOW   APPearance HAZY (A) CLEAR   Specific Gravity, Urine 1.015 1.005 - 1.030   pH 6.0 5.0 - 8.0   Glucose, UA NEGATIVE NEGATIVE mg/dL   Hgb urine dipstick LARGE (A) NEGATIVE   Bilirubin Urine SMALL (A) NEGATIVE   Ketones, ur NEGATIVE NEGATIVE mg/dL   Protein, ur 30 (A) NEGATIVE mg/dL   Nitrite NEGATIVE NEGATIVE   Leukocytes, UA TRACE (A) NEGATIVE  Urine microscopic-add on     Status: Abnormal   Collection Time: 01/25/16  8:52 AM  Result Value Ref Range   Squamous Epithelial / LPF NONE SEEN NONE SEEN   WBC, UA 0-5 0 - 5 WBC/hpf   RBC / HPF TOO NUMEROUS TO COUNT 0 - 5 RBC/hpf   Bacteria, UA MANY (A) NONE SEEN  Type and screen [facility]     Status: None   Collection Time: 01/25/16 12:15 PM  Result Value Ref Range   ABO/RH(D) O POS    Antibody Screen NEG    Sample Expiration 01/28/2016    Unit Number  [id number]    Blood Component Type RED CELLS,LR    Unit division 00    Status of Unit ISSUED,FINAL    Transfusion Status OK TO TRANSFUSE    Crossmatch Result Compatible    Unit Number [id number]    Blood Component Type RBC, LR IRR    Unit division 00    Status of Unit ISSUED,FINAL    Transfusion Status OK TO TRANSFUSE    Crossmatch Result Compatible   Protime-INR     Status: Abnormal   Collection Time: 01/25/16 12:15 PM  Result Value Ref Range   Prothrombin Time 25.9 (H) 11.6 - 15.2 seconds   INR 2.40 (H) 0.00 - 1.49  Prepare RBC     Status: None   Collection Time: 01/25/16 12:15 PM  Result Value Ref Range   Order Confirmation ORDER PROCESSED BY BLOOD BANK   APTT     Status: None   Collection Time: 01/25/16 12:15 PM  Result Value Ref Range   aPTT 36 24 - 37 seconds  Heparin level - if patient on rivaroxaban (XARELTO) or apixaban Arne [place])     Status: Abnormal   Collection Time: 01/25/16 12:15 PM  Result Value Ref Range   Heparin Unfractionated 1.55 (H) 0.30 - 0.70 IU/mL    Comment: RESULTS CONFIRMED BY MANUAL DILUTION        IF HEPARIN RESULTS ARE BELOW EXPECTED VALUES, AND PATIENT DOSAGE HAS BEEN CONFIRMED, SUGGEST FOLLOW UP TESTING OF ANTITHROMBIN III LEVELS.   ABO/Rh     Status: None   Collection Time: 01/25/16 12:15 PM  Result Value Ref Range   ABO/RH(D) O POS   MRSA PCR Screening     Status: None   Collection Time: 01/25/16  1:40 PM  Result Value Ref Range   MRSA by PCR NEGATIVE NEGATIVE    Comment:        The GeneXpert MRSA Assay (FDA approved for NASAL specimens only), is one component of a comprehensive MRSA colonization surveillance program. It is not intended to diagnose MRSA infection nor to guide or monitor treatment for MRSA infections.   C difficile quick scan w PCR reflex     Status: Abnormal   Collection Time: 01/25/16  2:00 PM  Result Value Ref Range   C Diff antigen POSITIVE (A) NEGATIVE   C  Diff toxin NEGATIVE NEGATIVE   C Diff  interpretation      C. difficile present, but toxin not detected. This indicates colonization. In most cases, this does not require treatment. If patient has signs and symptoms consistent with colitis, consider treatment. Requires ENTERIC precautions.  Lactic acid, plasma     Status: None   Collection Time: 01/25/16  6:44 PM  Result Value Ref Range   Lactic Acid, Venous 0.9 0.5 - 2.0 mmol/L  CBC     Status: Abnormal   Collection Time: 01/25/16  6:44 PM  Result Value Ref Range   WBC 27.7 (H) 4.0 - 10.5 K/uL   RBC 3.20 (L) 4.22 - 5.81 MIL/uL   Hemoglobin 9.0 (L) 13.0 - 17.0 g/dL    Comment: POST TRANSFUSION SPECIMEN   HCT 27.3 (L) 39.0 - 52.0 %   MCV 85.3 78.0 - 100.0 fL   MCH 28.1 26.0 - 34.0 pg   MCHC 33.0 30.0 - 36.0 g/dL   RDW 16.5 (H) 11.5 - 15.5 %   Platelets 329 150 - 400 K/uL  Comprehensive metabolic panel     Status: Abnormal   Collection Time: 01/26/16  4:43 AM  Result Value Ref Range   Sodium 138 135 - 145 mmol/L   Potassium 3.0 (L) 3.5 - 5.1 mmol/L    Comment: DELTA CHECK NOTED   Chloride 109 101 - 111 mmol/L   CO2 25 22 - 32 mmol/L   Glucose, Bld 76 65 - 99 mg/dL   BUN 20 6 - 20 mg/dL   Creatinine, Ser 0.59 (L) 0.61 - 1.24 mg/dL   Calcium 7.5 (L) 8.9 - 10.3 mg/dL   Total Protein 4.8 (L) 6.5 - 8.1 g/dL   Albumin 2.3 (L) 3.5 - 5.0 g/dL   AST 21 15 - 41 U/L   ALT 17 17 - 63 U/L   Alkaline Phosphatase 97 38 - 126 U/L   Total Bilirubin 0.6 0.3 - 1.2 mg/dL   GFR calc non Af Amer >60 >60 mL/min   GFR calc Af Amer >60 >60 mL/min    Comment: (NOTE) The eGFR has been calculated using the CKD EPI equation. This calculation has not been validated in all clinical situations. eGFR's persistently <60 mL/min signify possible Chronic Kidney Disease.    Anion gap 4 (L) 5 - 15  CBC     Status: Abnormal   Collection Time: 01/26/16  4:43 AM  Result Value Ref Range   WBC 29.5 (H) 4.0 - 10.5 K/uL    Comment: WHITE COUNT CONFIRMED ON SMEAR   RBC 3.21 (L) 4.22 - 5.81 MIL/uL    Hemoglobin 9.0 (L) 13.0 - 17.0 g/dL   HCT 27.4 (L) 39.0 - 52.0 %   MCV 85.4 78.0 - 100.0 fL   MCH 28.0 26.0 - 34.0 pg   MCHC 32.8 30.0 - 36.0 g/dL   RDW 17.1 (H) 11.5 - 15.5 %   Platelets 333 150 - 400 K/uL  CBC     Status: Abnormal   Collection Time: 01/26/16  6:53 PM  Result Value Ref Range   WBC 31.1 (H) 4.0 - 10.5 K/uL   RBC 3.44 (L) 4.22 - 5.81 MIL/uL   Hemoglobin 9.4 (L) 13.0 - 17.0 g/dL   HCT 29.7 (L) 39.0 - 52.0 %   MCV 86.3 78.0 - 100.0 fL   MCH 27.3 26.0 - 34.0 pg   MCHC 31.6 30.0 - 36.0 g/dL   RDW 17.4 (H) 11.5 - 15.5 %  Platelets 376 150 - 400 K/uL  CBC     Status: Abnormal   Collection Time: 01/27/16 12:00 AM  Result Value Ref Range   WBC 27.0 (H) 4.0 - 10.5 K/uL    Comment: CONSISTENT WITH PREVIOUS RESULT   RBC 3.12 (L) 4.22 - 5.81 MIL/uL   Hemoglobin 8.5 (L) 13.0 - 17.0 g/dL   HCT 26.7 (L) 39.0 - 52.0 %   MCV 85.6 78.0 - 100.0 fL   MCH 27.2 26.0 - 34.0 pg   MCHC 31.8 30.0 - 36.0 g/dL   RDW 17.3 (H) 11.5 - 15.5 %   Platelets 314 150 - 400 K/uL  Comprehensive metabolic panel     Status: Abnormal   Collection Time: 01/27/16 12:00 AM  Result Value Ref Range   Sodium 137 135 - 145 mmol/L   Potassium 3.3 (L) 3.5 - 5.1 mmol/L   Chloride 111 101 - 111 mmol/L   CO2 21 (L) 22 - 32 mmol/L   Glucose, Bld 67 65 - 99 mg/dL   BUN 18 6 - 20 mg/dL   Creatinine, Ser 0.50 (L) 0.61 - 1.24 mg/dL   Calcium 7.4 (L) 8.9 - 10.3 mg/dL   Total Protein 4.6 (L) 6.5 - 8.1 g/dL   Albumin 2.1 (L) 3.5 - 5.0 g/dL   AST 24 15 - 41 U/L   ALT 18 17 - 63 U/L   Alkaline Phosphatase 82 38 - 126 U/L   Total Bilirubin 0.8 0.3 - 1.2 mg/dL   GFR calc non Af Amer >60 >60 mL/min   GFR calc Af Amer >60 >60 mL/min    Comment: (NOTE) The eGFR has been calculated using the CKD EPI equation. This calculation has not been validated in all clinical situations. eGFR's persistently <60 mL/min signify possible Chronic Kidney Disease.    Anion gap 5 5 - 15  CBC     Status: Abnormal   Collection  Time: 01/27/16  4:27 AM  Result Value Ref Range   WBC 25.8 (H) 4.0 - 10.5 K/uL   RBC 3.04 (L) 4.22 - 5.81 MIL/uL   Hemoglobin 8.4 (L) 13.0 - 17.0 g/dL   HCT 26.2 (L) 39.0 - 52.0 %   MCV 86.2 78.0 - 100.0 fL   MCH 27.6 26.0 - 34.0 pg   MCHC 32.1 30.0 - 36.0 g/dL   RDW 17.1 (H) 11.5 - 15.5 %   Platelets 324 150 - 400 K/uL    ABGS No results for input(s): PHART, PO2ART, TCO2, HCO3 in the last 72 hours.  Invalid input(s): PCO2 CULTURES Recent Results (from the past 240 hour(s))  MRSA PCR Screening     Status: None   Collection Time: 01/25/16  1:40 PM  Result Value Ref Range Status   MRSA by PCR NEGATIVE NEGATIVE Final    Comment:        The GeneXpert MRSA Assay (FDA approved for NASAL specimens only), is one component of a comprehensive MRSA colonization surveillance program. It is not intended to diagnose MRSA infection nor to guide or monitor treatment for MRSA infections.   C difficile quick scan w PCR reflex     Status: Abnormal   Collection Time: 01/25/16  2:00 PM  Result Value Ref Range Status   C Diff antigen POSITIVE (A) NEGATIVE Final   C Diff toxin NEGATIVE NEGATIVE Final   C Diff interpretation   Final    C. difficile present, but toxin not detected. This indicates colonization. In most cases, this does not require  treatment. If patient has signs and symptoms consistent with colitis, consider treatment. Requires ENTERIC precautions.   Studies/Results: Ct Abdomen Pelvis W Wo Contrast  01/25/2016  CLINICAL DATA:  GI bleeding, prior portal venous gas on previous CT EXAM: CT ABDOMEN AND PELVIS WITHOUT AND WITH CONTRAST TECHNIQUE: Multidetector CT imaging of the abdomen and pelvis was performed following the standard protocol before and following the bolus administration of intravenous contrast. CONTRAST:  167m ISOVUE-300 IOPAMIDOL (ISOVUE-300) INJECTION 61% COMPARISON:  01/11/2016 FINDINGS: Lung bases are free of acute infiltrate or sizable effusion. Gallbladder has  been surgically removed. The previously seen portal venous air within the left lobe of the liver is no longer identified. Portal vein is well visualized and demonstrates a normal branching pattern. No portal venous thrombosis is noted. Again no portal venous air is seen. The spleen, right adrenal gland and pancreas are stable in appearance from the prior exam. Small duodenal diverticulum is seen. Left adrenal gland demonstrates some hypertrophy but stable from the prior study. The kidneys are stable in appearance with cystic change and nonobstructing renal calculi bilaterally. The bladder is decompressed by Foley catheter. Prostatic calcifications are seen. No free pelvic fluid is noted. Mild diverticular change is noted without evidence of diverticulitis. No pooling of contrast to correspond with the given clinical history of GI bleeding is noted. Aortoiliac calcifications are seen. Tortuosity of the abdominal aorta is noted. Tube graft is noted within the distal aorta related to prior surgical repair. This is stable from the prior exam. The appendix is not well visualized although no inflammatory changes are seen. The osseous structures show a scoliosis of the lumbar spine concave to the left. This is stable from the prior exam. No acute bony abnormality is noted. IMPRESSION: Resolution of previously seen portal venous gas. No findings to correspond with the patient's given clinical history of active GI bleeding are seen. Direct visualization may be helpful. Nuclear bleeding scan may also be helpful for localization Remainder of the exam is stable from the prior study with evidence of bilateral renal calculi and right renal cystic change without obstructive change. Chronic changes as described. Electronically Signed   By: MInez CatalinaM.D.   On: 01/25/2016 19:48   Dg Abd Acute W/chest  01/25/2016  CLINICAL DATA:  Diarrhea.  Abdominal pain.  Weakness. EXAM: DG ABDOMEN ACUTE W/ 1V CHEST COMPARISON:  01/13/2016.  FINDINGS: Borderline enlarged cardiac silhouette. Clear lungs. Normal bowel gas pattern without free peritoneal air. Cholecystectomy clips. 6 mm nonspecific oval calcification overlying the right lower abdomen medially. Marked dextroconvex lumbar rotary scoliosis and degenerative changes. Mild to moderate thoracic scoliosis and degenerative changes. Mild left shoulder degenerative changes. Diffuse osteopenia. Atheromatous arterial calcifications. IMPRESSION: 1. No acute abnormality. 2. 6 mm oval calcification overlying the right lower abdomen, medially. This could potentially represent a ureteral calculus or phlebolith. An appendicolith is less likely. 3. Thoracolumbar scoliosis and degenerative changes. Electronically Signed   By: SClaudie ReveringM.D.   On: 01/25/2016 10:18    Medications:  Prior to Admission:  Prescriptions prior to admission  Medication Sig Dispense Refill Last Dose  . acetaminophen (TYLENOL) 500 MG tablet Take 500 mg by mouth every 6 (six) hours as needed for mild pain or moderate pain.   Past Week at Unknown time  . aspirin EC 81 MG tablet Take 81 mg by mouth daily.   01/25/2016 at Unknown time  . digoxin (LANOXIN) 0.125 MG tablet Take 1 tablet (0.125 mg total) by mouth daily. 90 tablet  2 01/25/2016 at Unknown time  . Multiple Vitamins-Minerals (MULTIVITAMINS THER. W/MINERALS) TABS tablet Take 1 tablet by mouth daily.   01/25/2016 at Unknown time  . OXYGEN Inhale 2 L into the lungs at bedtime.   01/24/2016 at Unknown time  . propranolol (INDERAL) 20 MG tablet Take 1.5 tablets (30 mg total) by mouth 3 (three) times daily. 30 tablet 0 01/25/2016 at 0800  . rivaroxaban (XARELTO) 20 MG TABS tablet Take 1 tablet (20 mg total) by mouth daily with supper. 90 tablet 0 01/24/2016 at Unknown time  . simvastatin (ZOCOR) 40 MG tablet Take 1 tablet by mouth daily.   01/25/2016 at Unknown time  . tamsulosin (FLOMAX) 0.4 MG CAPS capsule Take 1 capsule (0.4 mg total) by mouth daily. 90 capsule 2 01/25/2016  at Unknown time  . nicotine (NICODERM CQ - DOSED IN MG/24 HOURS) 14 mg/24hr patch Place 1 patch (14 mg total) onto the skin daily. (Patient not taking: Reported on 01/25/2016) 28 patch 0 Not Taking at Unknown time   Scheduled: . antiseptic oral rinse  7 mL Mouth Rinse BID  . metoprolol  5 mg Intravenous Q6H  . sodium chloride flush  3 mL Intravenous Q12H  . sodium phosphate  2 enema Rectal BID  . vancomycin  125 mg Oral Q6H   Continuous: . sodium chloride 100 mL/hr at 01/27/16 0649  . sodium chloride 20 mL/hr at 01/26/16 1252   JGO:[id number] **OR** acetaminophen, morphine injection, ondansetron **OR** ondansetron (ZOFRAN) IV  Assesment: He was admitted with bleeding. He has a fairly new onset of atrial fibrillation with RVR and he's been treated at home with a beta blocker and digoxin. He is on a beta blocker here but still has elevated heart rates on going to give him a dose of digoxin now. His blood pressure has been on the low side so I don't think increasing his beta blocker is the way to go now. However I will request cardiology consultation and defer to their judgment.  He has acute blood loss anemia and his hemoglobin level is pretty stable now. No further bleeding during his prep for colonoscopy/endoscopy  He has COPD at baseline.  He has lost weight. He has been complaining of postprandial abdominal discomfort and the concern of course with weight loss and now bleeding is he may have some sort of a GI malignancy  He has diarrhea and a positive C. difficile antigen and is being treated for C. difficile colitis  He had acute urinary retention and has Foley catheter in place and I will leave that in place for now until he's better as far as his hemodynamics bleeding etc. Principal Problem:   Acute blood loss anemia Active Problems:   Atrial fibrillation with RVR (HCC)   COPD (chronic obstructive pulmonary disease) (HCC)   Hypertension   High cholesterol   Hypokalemia    Rectal bleeding   Protein-calorie malnutrition, severe    Plan: For colonoscopy and EGD today. Add digoxin. Cardiology consultation has been requested    LOS: 2 days   Gennifer Potenza L 01/27/2016, 7:39 AM

## 2016-01-27 NOTE — Op Note (Signed)
[facility] Patient Name: Steve Gomez Procedure Date: 01/27/2016 2:44 PM MRN: [id number] Date of Birth: Sep 15, 1940 Attending MD: Norvel Richards , MD CSN: [id number] Age: 75 Admit Type: Inpatient Procedure:                Ileo-Colonoscopy with biopsy Indications:              Hematochezia Providers:                Norvel Richards, MD, Hinton Rao, RN,                            Randa Spike, Technician Referring MD:              Medicines:                Midazolam 2 mg IV, Meperidine 50 mg IV Complications:            No immediate complications. Estimated Blood Loss:     Estimated blood loss was minimal. Procedure:                Pre-Anesthesia Assessment:                           - Prior to the procedure, a History and Physical                            was performed, and patient medications and                            allergies were reviewed. The patient's tolerance of                            previous anesthesia was also reviewed. The risks                            and benefits of the procedure and the sedation                            options and risks were discussed with the patient.                            All questions were answered, and informed consent                            was obtained. Prior Anticoagulants: The patient has                            taken no previous anticoagulant or antiplatelet                            agents and last took Xarelto (rivaroxaban) 3 days                            prior to the procedure. ASA Grade Assessment: III -  A patient with severe systemic disease. After                            reviewing the risks and benefits, the patient was                            deemed in satisfactory condition to undergo the                            procedure.                           After obtaining informed consent, the colonoscope                            was passed under direct  vision. Throughout the                            procedure, the patient's blood pressure, pulse, and                            oxygen saturations were monitored continuously. The                            [id number] scope was introduced through                            the anus and advanced to the the terminal ileum.                            The terminal ileum, ileocecal valve, appendiceal                            orifice, and rectum were photographed. The quality                            of the bowel preparation was adequate. Scope In: 2:53:50 PM Scope Out: 3:08:53 PM Scope Withdrawal Time: 0 hours 9 minutes 39 seconds  Total Procedure Duration: 0 hours 15 minutes 3 seconds  Findings:      The perianal and digital rectal examinations were normal.      The colon was markedly abnormal beginning in the mid descending segment       all the way to the cecum/ileocecal bowel. Extensive ulceration of the       colonic mucus begin in the distal descending segment and this extended       with frequent areas of ulceration all the way to the cecum. The       ileocecal valve was also abnormal. I intubated the terminal ileum 20 cm       and found somewhat dusky appearing mucosa also with extensive ulceration       extending as proximally as I could visualize. I did not see any       pseudomembranes. The sigmoid and rectal mucosa appeared relatively       spared. I biopsied the abnormal appearing descending, ascending and  terminal ileal mucosa.The perianal and digital rectal examinations were       normal.      Biopsies of the terminal ileum mucosa and colonic mucosa taken for       histologic study. Impression:               Marked inflammatory changes with ulceration                            involving most of the more proximal colon with more                            extreme inflammatory changes extending up into the                            terminal ileum?"status  post biopsy. patient with                            pneumatosis and portal venous air 2 weeks ago.                            Given findings of the EGD today, my concerns are                            heightened regarding gut ischemia. Moderate Sedation:      Moderate (conscious) sedation was administered by the endoscopy nurse       and supervised by the endoscopist. The following parameters were       monitored: oxygen saturation, heart rate, blood pressure, respiratory       rate, EKG, adequacy of pulmonary ventilation, and response to care.       Total physician intraservice time was 45 minutes. Recommendation:           Clear liquid diet. Follow-up pathology. I discussed                            with Dr. Luan Pulling. I recommend surgery consultation.                            I have discussed with the patient's wife in the                            endoscopy unit. I have showed her the endoscopy                            photographs.Further recommendations to follow.                           - No repeat colonoscopy due to age.                           - Return patient to hospital ward [Reason].                           - Await pathology results. Procedure Code(s):        ---  Professional ---                           [phone], Moderate sedation services provided by the                            same physician or other qualified health care                            professional performing the diagnostic or                            therapeutic service that the sedation supports,                            requiring the presence of an independent trained                            observer to assist in the monitoring of the                            patient's level of consciousness and physiological                            status; initial 15 minutes of intraservice time,                            patient age 92 years or older                           [phone], Moderate  sedation services; each additional                            15 minutes intraservice time                           99153, Moderate sedation services; each additional                            15 minutes intraservice time Diagnosis Code(s):        --- Professional ---                           K63.3, Ulcer of intestine                           K92.1, Melena (includes Hematochezia) CPT copyright 2016 [employer]. All rights reserved. The codes documented in this report are preliminary and upon coder review may  be revised to meet current compliance requirements. [name]. Ein Rijo, MD Norvel Richards, MD 01/27/2016 4:04:43 PM This report has been signed electronically. Number of Addenda: 0

## 2016-01-27 NOTE — Progress Notes (Signed)
E-link Rn/MD updated on 1915 hgb=9.4 then @0014  hgb8.5 & pt remaining tachy 120's

## 2016-01-27 NOTE — Consult Note (Signed)
   [facility] CM Inpatient Consult   01/27/2016  Steve Gomez Aug 12, 1941 [id number]   Patient is currently active with [facility] for chronic disease management services.  Patient has been engaged by a [employer].  Our community based plan of care has focused on disease management and community resource support.  Patient will receive a post discharge transition of care call and will be evaluated for monthly home visits for assessments and disease process education.  Made Inpatient Case Manager aware that [facility] following.  Of note, [facility] Care Management services does not replace or interfere with any services that are arranged by inpatient case management or social work.   For additional questions or referrals please contact:   Royetta Crochet. [name], RN, BSN, [place] [phone]) Business Cell  [phone]) Toll Free Office

## 2016-01-27 NOTE — Op Note (Signed)
[facility] Patient Name: Steve Gomez Procedure Date: 01/27/2016 2:23 PM MRN: [id number] Date of Birth: 21-Oct-1940 Attending MD: Norvel Richards , MD CSN: [id number] Age: 75 Admit Type: Inpatient Procedure:                Upper GI endoscopy with gastric and duodenal biopsy Indications:              Generalized abdominal pain, Hematochezia, Weight                            loss Providers:                Norvel Richards, MD, Hinton Rao, RN,                            Randa Spike, Technician Referring MD:              Medicines:                Midazolam 2 mg IV, Meperidine 50 mg IV, Ondansetron                            4 mg IV Complications:            No immediate complications. Estimated Blood Loss:     Estimated blood loss was minimal. Procedure:                Pre-Anesthesia Assessment:                           - Prior to the procedure, a History and Physical                            was performed, and patient medications and                            allergies were reviewed. The patient's tolerance of                            previous anesthesia was also reviewed. The risks                            and benefits of the procedure and the sedation                            options and risks were discussed with the patient.                            All questions were answered, and informed consent                            was obtained. Prior Anticoagulants: The patient has                            taken no previous anticoagulant or antiplatelet  agents and last took Xarelto (rivaroxaban) 3 days                            prior to the procedure. ASA Grade Assessment: III -                            A patient with severe systemic disease. After                            reviewing the risks and benefits, the patient was                            deemed in satisfactory condition to undergo the    procedure.                           After obtaining informed consent, the endoscope was                            passed under direct vision. Throughout the                            procedure, the patient's blood pressure, pulse, and                            oxygen saturations were monitored continuously. The                            [id number] scope was introduced through the                            mouth, and advanced to the fourth part of duodenum.                            The upper GI endoscopy was accomplished without                            difficulty. The patient tolerated the procedure                            well. Scope In: 2:32:38 PM Scope Out: 2:44:36 PM Total Procedure Duration: 0 hours 11 minutes 58 seconds  Findings:      The esophagus was normal.      The stomach was markedly abnormal. Geographic ulceration and erosions       involving the entire gastric mucosa. Mucosa appeared somewhat edematous.       Complete loss of the normal vascular pattern. Marked friability.This       extended the pyloric channel. There was scalloping of the duodenal bulb       and second portion mucosa. As I advanced scope down into the third and       fourth portion of the duodenum, the Inflammatory changes became more       pronounced with edema, encroachment on the lumen with multiple       geographic areas of ulceration extending distally.  Overlying exudate       present. Please see multiple photographs. Multiple biopsies of the       duodenal and gastric mucosa taken for histologic study.      . Impression:               - Markedly abnormal stomach and duodenum as                            described and photographed. These findings heighten                            my concern about stuttering gut ischemia given he                            was recently found to have portal venous gas and a                            segment of pneumatosis in his small bowel  on CT                            scan 2 weeks ago (not in the official report). More                            recent CT scan did not show air outside the bowel. Moderate Sedation:      Moderate (conscious) sedation was administered by the endoscopy nurse       and supervised by the endoscopist. The following parameters were       monitored: oxygen saturation, heart rate, blood pressure, respiratory       rate, EKG, adequacy of pulmonary ventilation, and response to care.       Total physician intraservice time was 14 minutes. Recommendation:           -See colonoscopy report.                           - No aspirin, ibuprofen, naproxen, or other                            non-steroidal anti-inflammatory drugs. Further                            recommendations to follow. Procedure Code(s):        --- Professional ---                           [id number], Esophagogastroduodenoscopy, flexible,                            transoral; diagnostic, including collection of                            specimen(s) by brushing or washing, when performed                            (separate procedure)  [id number], Moderate sedation services provided by the                            same physician or other qualified health care                            professional performing the diagnostic or                            therapeutic service that the sedation supports,                            requiring the presence of an independent trained                            observer to assist in the monitoring of the                            patient's level of consciousness and physiological                            status; initial 15 minutes of intraservice time,                            patient age 56 years or older Diagnosis Code(s):        --- Professional ---                           K25.4, Chronic or unspecified gastric ulcer with                            hemorrhage                            R10.84, Generalized abdominal pain                           K92.1, Melena (includes Hematochezia)                           R63.4, Abnormal weight loss CPT copyright 2016 American Medical Association. All rights reserved. The codes documented in this report are preliminary and upon coder review may  be revised to meet current compliance requirements. [name]. Rourk, MD Norvel Richards, MD 01/27/2016 4:51:17 PM This report has been signed electronically. Number of Addenda: 0

## 2016-01-27 NOTE — Progress Notes (Addendum)
Dr Anastasio Champion paged for hgb 8.5 @0014  &@1915  hgb 9.4

## 2016-01-27 NOTE — Consult Note (Signed)
SURGICAL CONSULTATION NOTE (initial)  HISTORY OF PRESENT ILLNESS (HPI):  75 y.o. male with chronic post-prandial abdominal pain, decreased food consumption, and weight loss presented with GI bleed (melena) after being started on Xarelto for new onset atrial fibrillation with hypotension during a recent hospital admission. At that time, patient also complained of acute abdominal pain that was attributed to cystitis, and CT imaging demonstrated pneumatosis and portal venous gas. Although a relatively poor historian, patient reports improvement of abdominal pain following discharge from the hospital, and portal venous gas was resolved on repeat CT imaging. Upper and lower endoscopy today revealed diffuse inflammation suggestive of ischemia, in which context surgery is consulted. Patient currently denies any abdominal pain, N/V, fever/chills, CP, or SOB.  PAST MEDICAL HISTORY (PMH):  Past Medical History  Diagnosis Date  . COPD (chronic obstructive pulmonary disease) ([place])   . Hypertension   . High cholesterol   . S/P AAA repair   . Renal calculi 01/14/2016  . New onset atrial fibrillation ([place])   . Bladder outlet obstruction 01/14/2016  . Atherosclerosis   . Tobacco abuse      PAST SURGICAL HISTORY [facility]):  Past Surgical History  Procedure Laterality Date  . Cholecystectomy    . Open transabdominal repair of abdominal aortic aneurysm       MEDICATIONS:  Prior to Admission medications   Medication Sig Start Date End Date Taking? Authorizing Provider  acetaminophen (TYLENOL) 500 MG tablet Take 500 mg by mouth every 6 (six) hours as needed for mild pain or moderate pain.   Yes Historical Provider, MD  aspirin EC 81 MG tablet Take 81 mg by mouth daily.   Yes Historical Provider, MD  digoxin (LANOXIN) 0.125 MG tablet Take 1 tablet (0.125 mg total) by mouth daily. 01/17/16  Yes Venetia Maxon Rama, MD  Multiple Vitamins-Minerals (MULTIVITAMINS THER. W/MINERALS) TABS tablet Take 1 tablet by mouth  daily. 11/30/14  Yes Historical Provider, MD  OXYGEN Inhale 2 L into the lungs at bedtime.   Yes Historical Provider, MD  propranolol (INDERAL) 20 MG tablet Take 1.5 tablets (30 mg total) by mouth 3 (three) times daily. 01/21/16  Yes Imogene Burn, PA-C  rivaroxaban (XARELTO) 20 MG TABS tablet Take 1 tablet (20 mg total) by mouth daily with supper. 01/17/16  Yes Christina P Rama, MD  simvastatin (ZOCOR) 40 MG tablet Take 1 tablet by mouth daily. 12/01/14  Yes Historical Provider, MD  tamsulosin (FLOMAX) 0.4 MG CAPS capsule Take 1 capsule (0.4 mg total) by mouth daily. 01/17/16  Yes Christina P Rama, MD  nicotine (NICODERM CQ - DOSED IN MG/24 HOURS) 14 mg/24hr patch Place 1 patch (14 mg total) onto the skin daily. Patient not taking: Reported on 01/25/2016 01/17/16   Venetia Maxon Rama, MD     ALLERGIES:  No Known Allergies   SOCIAL HISTORY:  Social History   Social History  . Marital Status: Married    Spouse Name: N/A  . Number of Children: N/A  . Years of Education: N/A   Occupational History  . Not on file.   Social History Main Topics  . Smoking status: Current Every Day Smoker -- 1.00 packs/day    Types: Cigarettes    Start date: 08/30/1960  . Smokeless tobacco: Not on file  . Alcohol Use: 0.0 oz/week    0 Standard drinks or equivalent per week     Comment: "just a couple of swallows of wine every night"  . Drug Use: No  . Sexual Activity:  Not on file   Other Topics Concern  . Not on file   Social History Narrative    The patient normally is (ambulatory / bedbound): Ambulatory   FAMILY HISTORY:  Family History  Problem Relation Age of Onset  . Suicidality Father     Deceased   . Diabetes Brother   . Diabetes Brother   . Diabetes Brother      REVIEW OF SYSTEMS:  Constitutional: denies weight loss, fever, chills, or sweats  Eyes: denies any other vision changes, history of eye injury  ENT: denies sore throat, hearing problems  Respiratory: denies shortness of  breath, wheezing  Cardiovascular: denies chest pain, palpitations  Gastrointestinal: abdominal pain and GI bleed as per HPI, denies N/V  Musculoskeletal: denies any other joint pains or cramps  Skin: denies any other rashes or skin discolorations  Neurological: denies any other headache, dizziness, weakness  Psychiatric: denies any other depression, anxiety   All other review of systems were negative   VITAL SIGNS:  Temp:  [97 F (36.1 C)-98.6 F (37 C)] 97.8 F (36.6 C) (05/30 1619) Pulse Rate:  [30-131] 95 (05/30 1608) Resp:  [14-37] 14 (05/30 1608) BP: (79-116)/(49-80) 105/53 mmHg (05/30 1515) SpO2:  [90 %-100 %] 99 % (05/30 1608) Weight:  [52.2 kg (115 lb 1.3 oz)] 52.2 kg (115 lb 1.3 oz) (05/30 0458)     Height: 5\' 4"  (162.6 cm) Weight: 52.2 kg (115 lb 1.3 oz) BMI (Calculated): 19.3   INTAKE/OUTPUT:  This shift: Total I/O In: 1518.3 [I.V.:1518.3] Out: -   Last 2 shifts: @IOLAST2SHIFTS @   PHYSICAL EXAM:  Constitutional:  -- Normal body habitus (relatively thin)  -- Awake, alert, and oriented x3  Eyes:  -- Pupils equally round and reactive to light  -- No scleral icterus  Ear, nose, and throat:  -- No jugular venous distension  Pulmonary:  -- No crackles  -- Equal breath sounds bilaterally  Cardiovascular:  -- S1, S2 present  -- No pericardial rubs Abdomen:  -- Soft, nontender, nondistended, no guarding/rebound  -- No abdominal masses appreciated, pulsatile or otherwise -- Well-healed midline laparotomy and RUQ incisions Musculoskeletal / Integumentary:  -- Wounds or skin discoloration: None  -- Extremities: B/L UE and LE FROM, hands and feet warm, no edema  Neurologic:  -- Motor function: intact and symmetric -- Sensation: intact and symmetric  Pulse/Doppler Exam: (p=palpable; d=doppler signals; 0=none)     Right   Left   Rad  p   p   Fem  p   p   DP  p   p   Labs:  CBC:  Lab Results  Component Value Date   WBC 25.8* 01/27/2016   RBC 3.04*  01/27/2016   BMP:  Lab Results  Component Value Date   GLUCOSE 67 01/27/2016   CO2 21* 01/27/2016   BUN 18 01/27/2016   CREATININE 0.50* 01/27/2016   CALCIUM 7.4* 01/27/2016     Imaging studies:  CT Abdomen and Pelvis (19-Jan-2016) Linear branching air in the left lobe of the liver concerning for portal venous gas. This is of indeterminate etiology, however the possibility of ischemic bowel is not excluded. Clinical correlation is recommended. Underdistention of the stomach versus gastritis. No evidence of bowel obstruction. No CT evidence for acute appendicitis.  There is aortoiliac atherosclerotic disease. There is focal area of advanced atherosclerotic plaque with narrowing of the aorta just inferior to the origins of the renal arteries. The aorta is tortuous. There is atherosclerotic calcifications of  the origins of the celiac axis and SMA. The origins the celiac axis, SMA appear patent. The SMV, and main portal vein are patent. No portal venous gas identified. There is no adenopathy. Set the abdominal wall soft tissues appear unremarkable. There is osteopenia with degenerative changes of the spine. No acute fracture.  CT Abdomen and Pelvis (01/27/2016) Resolution of previously seen portal venous gas.  Aortoiliac calcifications are seen. Tortuosity of the abdominal aorta is noted. Tube graft is noted within the distal aorta related to prior surgical repair. This is stable from the prior exam. The appendix is not well visualized although no inflammatory changes are seen. The osseous structures show a scoliosis of the lumbar spine concave to the left. This is stable from the prior exam. No acute bony abnormality is noted.  Assessment/Plan:  75 y.o. male with GI bleed and chronic mesenteric ischemia secondary to SMA and celiac arterial stenoses s/p open transabdominal aortic reconstruction for AAA with occlusion of IMA, complicated by what appears likely a low-flow state (recent and current)  in association with new onset atrial fibrillation and complicated by pertinent comorbidities including COPD, hypertension, anticoagulation for atrial fibrillation, AAA s/p repair, atherosclerosis, and tobacco abuse.   - Avoid hypotension  - Anticoagulation for atrial fibrillation without prior embolization currently held   - CTA abdomen and pelvis with thin cuts could offer better imaging of the SMA and celiac artery, but angiography offers most of the same diagnostic information along with an opportunity to intervene  - DVT prophylaxis  All of the above findings and recommendations were discussed with the patient and his medical team, and all of his questions were answered to his expressed satisfaction.  Thank you for the opportunity to participate in the care for this patient.   -- Marilynne Drivers Rosana Hoes, [place]: [place] [facility] Office: [phone]

## 2016-01-27 NOTE — Consult Note (Signed)
CARDIOLOGY CONSULT NOTE   Patient ID: Steve Gomez MRN: [id number] DOB/AGE: 1940/09/19 75 y.o.  Admit Date: 01/25/2016 Referring Physician: Sinda Du MD Primary Physician: Alonza Bogus, MD Consulting Cardiologist: Carlyle Dolly MD Primary Cardiologist: Jenkins Rouge MD Reason for Consultation: Atrial fibrillation   Clinical Summary Steve Gomez is a 74 y.o.male with known history of atrial fib, on Xarleto, CHADS VASC Score 3, AAA s/p repair, hypertension, hyperlipidemia, COPD, and ongoing tobacco abuse, presented to ER with lower GI bleed and anemia, and abdominal pain. He has been seen by Steve Gomez, GI, and taken off of Xarelto, which was actually reversed with Kcentra. He was found positive for C-Diff antigen, but toxin was negative.  He was given blood transfusion, 2 units, due to Hgb of 7.4. Aspirin has been withheld.   Heart rate has not been controlled, with rates >120-130's despite blood transfusion and improvement in Hgb to 8.4.Metoprolol 5mg  I V Q 6 hours is being utilized in the setting of NPO status for HR control, and he was given one IV dose of digoxin.. Due to uncontrolled atrial fibrillation, we are asked for cardiology recommendations. Colonoscopy and EDG is scheduled today.     No Known Allergies  Medications Scheduled Medications: . antiseptic oral rinse  7 mL Mouth Rinse BID  . metoprolol  5 mg Intravenous Q6H  . sodium chloride flush  3 mL Intravenous Q12H  . sodium phosphate  2 enema Rectal BID  . vancomycin  125 mg Oral Q6H    Infusions: . sodium chloride 100 mL/hr at 01/27/16 0649  . sodium chloride 20 mL/hr at 01/26/16 1252    PRN Medications: acetaminophen **OR** acetaminophen, morphine injection, ondansetron **OR** ondansetron (ZOFRAN) IV   Past Medical History  Diagnosis Date  . COPD (chronic obstructive pulmonary disease) ([place])   . Hypertension   . High cholesterol   . S/P AAA repair   . Renal calculi 01/14/2016  . New onset  atrial fibrillation ([place])   . Bladder outlet obstruction 01/14/2016  . Atherosclerosis   . Tobacco abuse     Past Surgical History  Procedure Laterality Date  . Cholecystectomy    . Abdominal aortic aneurysm repair      Family History  Problem Relation Age of Onset  . Suicidality Father     Deceased   . Diabetes Brother   . Diabetes Brother   . Diabetes Brother     Social History Steve Gomez reports that he has been smoking Cigarettes.  He started smoking about 55 years ago. He has been smoking about 1.00 pack per day. He does not have any smokeless tobacco history on file. Steve Gomez reports that he drinks alcohol.  Review of Systems Complete review of systems are found to be negative unless outlined in H&P above.  Physical Examination Blood pressure 106/56, pulse 124, temperature 98 F (36.7 C), temperature source Oral, resp. rate 23, height 5\' 4"  (1.626 m), weight 115 lb 1.3 oz (52.2 kg), SpO2 94 %.  Intake/Output Summary (Last 24 hours) at 01/27/16 0908 Last data filed at 01/27/16 0649  Gross per 24 hour  Intake   6265 ml  Output   1075 ml  Net   5190 ml    Telemetry: Atrial fib with labile HR 96-120 bpm.   GEN: Frail in no acute distress  HEENT: Conjunctiva and lids normal, oropharynx clear with moist mucosa. Neck: Supple, no elevated JVP or carotid bruits, no thyromegaly. Lungs: Some crackles in the bases, no wheezes or  coughing. Cardiac: Irregular rate and rhythm, no S3 or significant systolic murmur, no pericardial rub. Abdomen: Soft, nontender, no hepatomegaly, bowel sounds present, no guarding or rebound. Extremities: No pitting edema, distal pulses 2+. Skin: Warm and dry. Musculoskeletal: No kyphosis. Neuropsychiatric: Alert and oriented x3, affect grossly appropriate.  Prior Cardiac Testing/Procedures 1. Echocardiogram 01/14/2016.  Left ventricle: The cavity size was normal. Wall thickness was  normal. Systolic function was normal. The estimated  ejection  fraction was in the range of 55% to 60%. Wall motion was normal;  there were no regional wall motion abnormalities.   Left atrium normal size.  - Mitral valve: Calcified annulus. There was moderate  regurgitation. - Tricuspid valve: There was moderate regurgitation.  Lab Results  Basic Metabolic Panel:  Recent Labs Lab 01/25/16 0850 01/26/16 0443 01/27/16  NA 133* 138 137  K 4.2 3.0* 3.3*  CL 100* 109 111  CO2 24 25 21*  GLUCOSE 146* 76 67  BUN 26* 20 18  CREATININE 0.85 0.59* 0.50*  CALCIUM 8.2* 7.5* 7.4*    Liver Function Tests:  Recent Labs Lab 01/25/16 0850 01/26/16 0443 01/27/16  AST 29 21 24   ALT 23 17 18   ALKPHOS 109 97 82  BILITOT 0.4 0.6 0.8  PROT 5.5* 4.8* 4.6*  ALBUMIN 2.5* 2.3* 2.1*    CBC:  Recent Labs Lab 01/25/16 1844 01/26/16 0443 01/26/16 1853 01/27/16 01/27/16 0427  WBC 27.7* 29.5* 31.1* 27.0* 25.8*  HGB 9.0* 9.0* 9.4* 8.5* 8.4*  HCT 27.3* 27.4* 29.7* 26.7* 26.2*  MCV 85.3 85.4 86.3 85.6 86.2  PLT 329 333 376 314 324     Radiology: Ct Abdomen Pelvis W Wo Contrast  01/25/2016  CLINICAL DATA:  GI bleeding, prior portal venous gas on previous CT EXAM: CT ABDOMEN AND PELVIS WITHOUT AND WITH CONTRAST TECHNIQUE: Multidetector CT imaging of the abdomen and pelvis was performed following the standard protocol before and following the bolus administration of intravenous contrast. CONTRAST:  124mL ISOVUE-300 IOPAMIDOL (ISOVUE-300) INJECTION 61% COMPARISON:  01/11/2016 FINDINGS: Lung bases are free of acute infiltrate or sizable effusion. Gallbladder has been surgically removed. The previously seen portal venous air within the left lobe of the liver is no longer identified. Portal vein is well visualized and demonstrates a normal branching pattern. No portal venous thrombosis is noted. Again no portal venous air is seen. The spleen, right adrenal gland and pancreas are stable in appearance from the prior exam. Small duodenal diverticulum  is seen. Left adrenal gland demonstrates some hypertrophy but stable from the prior study. The kidneys are stable in appearance with cystic change and nonobstructing renal calculi bilaterally. The bladder is decompressed by Foley catheter. Prostatic calcifications are seen. No free pelvic fluid is noted. Mild diverticular change is noted without evidence of diverticulitis. No pooling of contrast to correspond with the given clinical history of GI bleeding is noted. Aortoiliac calcifications are seen. Tortuosity of the abdominal aorta is noted. Tube graft is noted within the distal aorta related to prior surgical repair. This is stable from the prior exam. The appendix is not well visualized although no inflammatory changes are seen. The osseous structures show a scoliosis of the lumbar spine concave to the left. This is stable from the prior exam. No acute bony abnormality is noted. IMPRESSION: Resolution of previously seen portal venous gas. No findings to correspond with the patient's given clinical history of active GI bleeding are seen. Direct visualization may be helpful. Nuclear bleeding scan may also be helpful for localization  Remainder of the exam is stable from the prior study with evidence of bilateral renal calculi and right renal cystic change without obstructive change. Chronic changes as described. Electronically Signed   By: Inez Catalina M.D.   On: 01/25/2016 19:48   Dg Abd Acute W/chest  01/25/2016  CLINICAL DATA:  Diarrhea.  Abdominal pain.  Weakness. EXAM: DG ABDOMEN ACUTE W/ 1V CHEST COMPARISON:  01/13/2016. FINDINGS: Borderline enlarged cardiac silhouette. Clear lungs. Normal bowel gas pattern without free peritoneal air. Cholecystectomy clips. 6 mm nonspecific oval calcification overlying the right lower abdomen medially. Marked dextroconvex lumbar rotary scoliosis and degenerative changes. Mild to moderate thoracic scoliosis and degenerative changes. Mild left shoulder degenerative changes.  Diffuse osteopenia. Atheromatous arterial calcifications. IMPRESSION: 1. No acute abnormality. 2. 6 mm oval calcification overlying the right lower abdomen, medially. This could potentially represent a ureteral calculus or phlebolith. An appendicolith is less likely. 3. Thoracolumbar scoliosis and degenerative changes. Electronically Signed   By: Claudie Revering M.D.   On: 01/25/2016 10:18     ECG: Sinus rhythm with PAC's rate of 101 bpm   Impression and Recommendations  1. Atrial fibrillation: Rates have been uncontrolled and labile since admission but have improved some with blood transfusion and IV metoprolol. Had been on propanolol and digoxin at home. Agree with holding Xarelto for now until source of GI bleeding is identified. IV metoprolol is currently keeping HR moderately controlled. Answered multiple questions from his wife at bedside. We are limited currently on medications due to hypotension. Will make further recommendations once GI work-up is completed.   Dr. Johnsie Cancel mentioned DCCV in prior notes. Will defer this until further information on GIB is obtained.   2. Hypertension: Currently soft in the setting of anemia and GIB. Has been on labetallol in the past  All antihypertensive medications are on hold with the exception of metoprolol for HR control.   3. GIB: Being followed by Steve Gomez, planned EGD and colonoscopy today.     Signed: Phill Myron. Lawrence NP [place]  01/27/2016, 9:08 AM Co-Sign MD  Attending Note Patient seen and discussed with NP Purcell Nails, I agree with her documentation above. 75 yo male history of AAA repair, HL, HTN, COPD, afib on xarelto admitted with GI bleed and anemia. His afib had been managed with propanolol and digoxin, from prior notes management has been complicated by low bp's. At last clinic visit 01/21/16 rates in 130s, inderal increased to 20mg  tid.   Hgb 8.4, Plt 324, Cr 0.5, BUN 18, K 3.3  CT A/P: no acute process EKG afib rate 101 12/2015 echo:  LVEF 55-60%, no WMAs, moderate MR.   Afib with RVR in setting of GI bleed. Currently on IV lopressor 5mg  IV q 6 hrs, received IV digoxin 0.5mg  once today. Rates 140s-150s early this AM, after digoxin dose down to 80s-100s. Based on his weight of 54 kg he has received his maximum loading dose of digoxin, continue to monitor rates at this time. If persistently elevated will need to change to amiodarone. We will f/u rates this AM. His afib is not a contraindication to endoscopy at this time as he is asymptomatic and hemodynamically stable. Continue to hold anticoag in setting of GI bleed.   Zandra Abts MD

## 2016-01-28 ENCOUNTER — Ambulatory Visit: Payer: Self-pay | Admitting: *Deleted

## 2016-01-28 DIAGNOSIS — K559 Vascular disorder of intestine, unspecified: Secondary | ICD-10-CM | POA: Insufficient documentation

## 2016-01-28 DIAGNOSIS — R197 Diarrhea, unspecified: Secondary | ICD-10-CM | POA: Insufficient documentation

## 2016-01-28 LAB — CBC WITH DIFFERENTIAL/PLATELET
BASOS PCT: 0 %
Basophils Absolute: 0 10*3/uL (ref 0.0–0.1)
EOS ABS: 0 10*3/uL (ref 0.0–0.7)
Eosinophils Relative: 0 %
HEMATOCRIT: 28.9 % — AB (ref 39.0–52.0)
HEMOGLOBIN: 9.1 g/dL — AB (ref 13.0–17.0)
Lymphocytes Relative: 6 %
Lymphs Abs: 1.3 10*3/uL (ref 0.7–4.0)
MCH: 27.6 pg (ref 26.0–34.0)
MCHC: 31.5 g/dL (ref 30.0–36.0)
MCV: 87.6 fL (ref 78.0–100.0)
MONOS PCT: 7 %
Monocytes Absolute: 1.5 10*3/uL — ABNORMAL HIGH (ref 0.1–1.0)
NEUTROS ABS: 19.5 10*3/uL — AB (ref 1.7–7.7)
NEUTROS PCT: 87 %
Platelets: 305 10*3/uL (ref 150–400)
RBC: 3.3 MIL/uL — AB (ref 4.22–5.81)
RDW: 16.8 % — ABNORMAL HIGH (ref 11.5–15.5)
WBC: 22.2 10*3/uL — AB (ref 4.0–10.5)

## 2016-01-28 LAB — BASIC METABOLIC PANEL
ANION GAP: 4 — AB (ref 5–15)
BUN: 14 mg/dL (ref 6–20)
CHLORIDE: 111 mmol/L (ref 101–111)
CO2: 24 mmol/L (ref 22–32)
Calcium: 7.5 mg/dL — ABNORMAL LOW (ref 8.9–10.3)
Creatinine, Ser: 0.46 mg/dL — ABNORMAL LOW (ref 0.61–1.24)
GFR calc non Af Amer: >60 mL/min (ref >60)
Glucose, Bld: 88 mg/dL (ref 65–99)
POTASSIUM: 3 mmol/L — AB (ref 3.5–5.1)
SODIUM: 139 mmol/L (ref 135–145)

## 2016-01-28 MED ORDER — NICOTINE 21 MG/24HR TD PT24
21.0000 mg | MEDICATED_PATCH | Freq: Every day | TRANSDERMAL | Status: DC
  Administered 2016-01-28 – 2016-02-09 (×12): 21 mg via TRANSDERMAL
  Filled 2016-01-28 (×12): qty 1

## 2016-01-28 MED ORDER — POTASSIUM CHLORIDE CRYS ER 20 MEQ PO TBCR
40.0000 meq | EXTENDED_RELEASE_TABLET | ORAL | Status: AC
  Administered 2016-01-28: 40 meq via ORAL
  Filled 2016-01-28: qty 2

## 2016-01-28 MED ORDER — AMIODARONE HCL IN DEXTROSE 360-4.14 MG/200ML-% IV SOLN
30.0000 mg/h | INTRAVENOUS | Status: DC
  Administered 2016-01-28 – 2016-01-29 (×3): 30 mg/h via INTRAVENOUS
  Filled 2016-01-28 (×7): qty 200

## 2016-01-28 MED ORDER — AMIODARONE HCL IN DEXTROSE 360-4.14 MG/200ML-% IV SOLN
60.0000 mg/h | INTRAVENOUS | Status: AC
  Administered 2016-01-28 (×2): 60 mg/h via INTRAVENOUS
  Filled 2016-01-28 (×2): qty 200

## 2016-01-28 MED ORDER — AMIODARONE LOAD VIA INFUSION
150.0000 mg | Freq: Once | INTRAVENOUS | Status: AC
  Administered 2016-01-28: 150 mg via INTRAVENOUS
  Filled 2016-01-28: qty 83.34

## 2016-01-28 NOTE — Progress Notes (Signed)
[facility] Day(s): 3.   Post op day(s): 1 Day Post-Op.   Interval History: Patient seen and examined, no acute events or new complaints overnight. Patient reports feeling well, having slept well, and denies abdominal pain, N/V, or fever/chills.  Review of Systems:  Constitutional: denies fever, chills  HEENT: denies cough or congestion  Respiratory: denies any shortness of breath  Cardiovascular: denies chest pain or palpitations  Gastrointestinal: denies N/V, diarrhea or constipation  Musculoskeletal: denies pain, decreased motor or sensation  Neurological: denies HA or vision/hearing changes   Vital signs in last 24 hours: [min-max] current  Temp:  [97.8 F (36.6 C)-98.6 F (37 C)] 98.4 F (36.9 C) (05/31 0800) Pulse Rate:  [30-113] 107 (05/31 0900) Resp:  [14-30] 19 (05/31 0900) BP: (79-124)/(49-80) 114/71 mmHg (05/31 0900) SpO2:  [90 %-100 %] 98 % (05/31 0900) FiO2 (%):  [32 %] 32 % (05/30 2030) Weight:  [53.3 kg (117 lb 8.1 oz)] 53.3 kg (117 lb 8.1 oz) (05/31 0500)     Height: 5\' 4"  (162.6 cm) Weight: 53.3 kg (117 lb 8.1 oz) BMI (Calculated): 19.3   Intake/Output this shift:  Total I/O In: 480 [P.O.:480] Out: -    Intake/Output last 2 shifts:  @IOLAST2SHIFTS @   Physical Exam:  Constitutional: alert, cooperative and no distress  HENT: normocephalic without obvious abnormality  Eyes: PERRL, EOM's grossly intact and symmetric  Neuro: CN II - XII grossly intact and symmetric without deficit  Respiratory: breathing non-labored at rest  Cardiovascular: regular rate and sinus rhythm  Gastrointestinal: soft, non-tender, and non-distended  Musculoskeletal: UE and LE FROM, no edema or wounds, motor and sensation grossly intact, NT   Pulse/Doppler Exam: (p=palpable; d=doppler signals; 0=none)     Right   Left   Fem  p   p   DP  p   p   Labs:  CBC:  Lab Results  Component Value Date   WBC 22.2* 01/28/2016   RBC 3.30* 01/28/2016   BMP:   Lab Results  Component Value Date   GLUCOSE 88 01/28/2016   CO2 24 01/28/2016   BUN 14 01/28/2016   CREATININE 0.46* 01/28/2016   CALCIUM 7.5* 01/28/2016     Imaging studies: No new pertinent imaging studies    Assessment/Plan:  75 y.o. male with GI bleed and chronic mesenteric ischemia secondary to SMA and celiac arterial stenoses s/p open transabdominal aortic reconstruction for AAA with occlusion of IMA, complicated by what appears likely a low-flow state (recent and current hypotension) in association with new onset atrial fibrillation and complicated by comorbidities including COPD, hypertension, anticoagulation for atrial fibrillation, AAA s/p repair, atherosclerosis, and tobacco abuse.  - Avoid hypotension - Anticoagulation for atrial fibrillation without prior embolization currently held  - CTA abdomen and pelvis with thin cuts could offer better imaging of the SMA and celiac artery, but angiography offers most of the same diagnostic information along with an opportunity to intervene - DVT prophylaxis  All of the above findings and recommendations were discussed with the patient and his wife, and all of their questions were answered to their expressed satisfaction.  -- Marilynne Drivers Rosana Hoes, [place]: [facility] Office: [phone]

## 2016-01-28 NOTE — Care Management Important Message (Signed)
Important Message  Patient Details  Name: Steve Gomez MRN: [id number] Date of Birth: Nov 15, 1940   Medicare Important Message Given:  Yes    Sherald Barge, RN 01/28/2016, 11:08 AM

## 2016-01-28 NOTE — Progress Notes (Signed)
Subjective: Events of yesterday noted. It appears that he had some evidence of bowel ischemia at his last hospitalization when he was diagnosed also with atrial fib. He had bleeding that based on the findings from EGD and colonoscopy is consistent with low flow state and ischemia causing bleeding. This of course was exacerbated by his anticoagulation. He says he feels better. He is still very weak.  Objective: Vital signs in last 24 hours: Temp:  [97.8 F (36.6 C)-98.6 F (37 C)] 98.3 F (36.8 C) (05/31 0400) Pulse Rate:  [30-113] 91 (05/31 0400) Resp:  [14-30] 20 (05/31 0400) BP: (79-115)/(49-80) 115/67 mmHg (05/31 0400) SpO2:  [90 %-100 %] 98 % (05/31 0400) FiO2 (%):  [32 %] 32 % (05/30 2030) Weight:  [53.3 kg (117 lb 8.1 oz)] 53.3 kg (117 lb 8.1 oz) (05/31 0500) Weight change: 1.1 kg (2 lb 6.8 oz) Last BM Date: 01/25/16  Intake/Output from previous day: 05/30 0701 - 05/31 0700 In: 2738.3 [I.V.:2738.3] Out: 850 [Urine:650; Stool:200]  PHYSICAL EXAM General appearance: alert, cooperative and pale Resp: clear to auscultation bilaterally Cardio: irregularly irregular rhythm GI: soft, non-tender; bowel sounds normal; no masses,  no organomegaly Extremities: extremities normal, atraumatic, no cyanosis or edema  Lab Results:  Results for orders placed or performed during the hospital encounter of 01/25/16 (from the past 48 hour(s))  CBC     Status: Abnormal   Collection Time: 01/26/16  6:53 PM  Result Value Ref Range   WBC 31.1 (H) 4.0 - 10.5 K/uL   RBC 3.44 (L) 4.22 - 5.81 MIL/uL   Hemoglobin 9.4 (L) 13.0 - 17.0 g/dL   HCT 29.7 (L) 39.0 - 52.0 %   MCV 86.3 78.0 - 100.0 fL   MCH 27.3 26.0 - 34.0 pg   MCHC 31.6 30.0 - 36.0 g/dL   RDW 17.4 (H) 11.5 - 15.5 %   Platelets 376 150 - 400 K/uL  CBC     Status: Abnormal   Collection Time: 01/27/16 12:00 AM  Result Value Ref Range   WBC 27.0 (H) 4.0 - 10.5 K/uL    Comment: CONSISTENT WITH PREVIOUS RESULT   RBC 3.12 (L) 4.22 - 5.81  MIL/uL   Hemoglobin 8.5 (L) 13.0 - 17.0 g/dL   HCT 26.7 (L) 39.0 - 52.0 %   MCV 85.6 78.0 - 100.0 fL   MCH 27.2 26.0 - 34.0 pg   MCHC 31.8 30.0 - 36.0 g/dL   RDW 17.3 (H) 11.5 - 15.5 %   Platelets 314 150 - 400 K/uL  Comprehensive metabolic panel     Status: Abnormal   Collection Time: 01/27/16 12:00 AM  Result Value Ref Range   Sodium 137 135 - 145 mmol/L   Potassium 3.3 (L) 3.5 - 5.1 mmol/L   Chloride 111 101 - 111 mmol/L   CO2 21 (L) 22 - 32 mmol/L   Glucose, Bld 67 65 - 99 mg/dL   BUN 18 6 - 20 mg/dL   Creatinine, Ser 0.50 (L) 0.61 - 1.24 mg/dL   Calcium 7.4 (L) 8.9 - 10.3 mg/dL   Total Protein 4.6 (L) 6.5 - 8.1 g/dL   Albumin 2.1 (L) 3.5 - 5.0 g/dL   AST 24 15 - 41 U/L   ALT 18 17 - 63 U/L   Alkaline Phosphatase 82 38 - 126 U/L   Total Bilirubin 0.8 0.3 - 1.2 mg/dL   GFR calc non Af Amer >60 >60 mL/min   GFR calc Af Amer >60 >60 mL/min  Comment: (NOTE) The eGFR has been calculated using the CKD EPI equation. This calculation has not been validated in all clinical situations. eGFR's persistently <60 mL/min signify possible Chronic Kidney Disease.    Anion gap 5 5 - 15  CBC     Status: Abnormal   Collection Time: 01/27/16  4:27 AM  Result Value Ref Range   WBC 25.8 (H) 4.0 - 10.5 K/uL   RBC 3.04 (L) 4.22 - 5.81 MIL/uL   Hemoglobin 8.4 (L) 13.0 - 17.0 g/dL   HCT 26.2 (L) 39.0 - 52.0 %   MCV 86.2 78.0 - 100.0 fL   MCH 27.6 26.0 - 34.0 pg   MCHC 32.1 30.0 - 36.0 g/dL   RDW 17.1 (H) 11.5 - 15.5 %   Platelets 324 150 - 400 K/uL  Glucose, capillary     Status: None   Collection Time: 01/27/16  7:39 AM  Result Value Ref Range   Glucose-Capillary 69 65 - 99 mg/dL    ABGS No results for input(s): PHART, PO2ART, TCO2, HCO3 in the last 72 hours.  Invalid input(s): PCO2 CULTURES Recent Results (from the past 240 hour(s))  MRSA PCR Screening     Status: None   Collection Time: 01/25/16  1:40 PM  Result Value Ref Range Status   MRSA by PCR NEGATIVE NEGATIVE Final     Comment:        The GeneXpert MRSA Assay (FDA approved for NASAL specimens only), is one component of a comprehensive MRSA colonization surveillance program. It is not intended to diagnose MRSA infection nor to guide or monitor treatment for MRSA infections.   C difficile quick scan w PCR reflex     Status: Abnormal   Collection Time: 01/25/16  2:00 PM  Result Value Ref Range Status   C Diff antigen POSITIVE (A) NEGATIVE Final   C Diff toxin NEGATIVE NEGATIVE Final   C Diff interpretation   Final    C. difficile present, but toxin not detected. This indicates colonization. In most cases, this does not require treatment. If patient has signs and symptoms consistent with colitis, consider treatment. Requires ENTERIC precautions.   Studies/Results: No results found.  Medications:  Prior to Admission:  Prescriptions prior to admission  Medication Sig Dispense Refill Last Dose  . acetaminophen (TYLENOL) 500 MG tablet Take 500 mg by mouth every 6 (six) hours as needed for mild pain or moderate pain.   Past Week at Unknown time  . aspirin EC 81 MG tablet Take 81 mg by mouth daily.   01/25/2016 at Unknown time  . digoxin (LANOXIN) 0.125 MG tablet Take 1 tablet (0.125 mg total) by mouth daily. 90 tablet 2 01/25/2016 at Unknown time  . Multiple Vitamins-Minerals (MULTIVITAMINS THER. W/MINERALS) TABS tablet Take 1 tablet by mouth daily.   01/25/2016 at Unknown time  . OXYGEN Inhale 2 L into the lungs at bedtime.   01/24/2016 at Unknown time  . propranolol (INDERAL) 20 MG tablet Take 1.5 tablets (30 mg total) by mouth 3 (three) times daily. 30 tablet 0 01/25/2016 at 0800  . rivaroxaban (XARELTO) 20 MG TABS tablet Take 1 tablet (20 mg total) by mouth daily with supper. 90 tablet 0 01/24/2016 at Unknown time  . simvastatin (ZOCOR) 40 MG tablet Take 1 tablet by mouth daily.   01/25/2016 at Unknown time  . tamsulosin (FLOMAX) 0.4 MG CAPS capsule Take 1 capsule (0.4 mg total) by mouth daily. 90 capsule  2 01/25/2016 at Unknown time  . nicotine (  NICODERM CQ - DOSED IN MG/24 HOURS) 14 mg/24hr patch Place 1 patch (14 mg total) onto the skin daily. (Patient not taking: Reported on 01/25/2016) 28 patch 0 Not Taking at Unknown time   Scheduled: . antiseptic oral rinse  7 mL Mouth Rinse BID  . metoprolol  5 mg Intravenous Q6H  . sodium chloride flush  3 mL Intravenous Q12H  . vancomycin  125 mg Oral Q6H   Continuous: . sodium chloride 100 mL/hr at 01/28/16 0400   ZOX:[id number] **OR** acetaminophen, morphine injection, ondansetron **OR** ondansetron (ZOFRAN) IV  Assesment: He was admitted with GI bleeding and acute blood loss anemia. This appears to be related to bowel ischemia hypotension and low flow. This is exacerbated by his anticoagulation for atrial fib. He has been having symptoms consistent with mesenteric ischemia for several months and has lost a significant amount of weight. He remains in atrial fib. He's not bleeding anymore.  He has still been smoking and reluctantly agrees to try to stop.  He has COPD at baseline and that is pretty stable  He has severe protein calorie malnutrition I think related to mesenteric ischemia. Principal Problem:   Acute blood loss anemia Active Problems:   Atrial fibrillation with RVR ([facility])   COPD (chronic obstructive pulmonary disease) ([facility])   Hypertension   High cholesterol   Hypokalemia   Rectal bleeding   Protein-calorie malnutrition, severe    Plan: He needs to recover some more from his episode of bleeding but he will need arteriogram with the thought that he might need surgical intervention versus stenting etc. We need to avoid hypotension so we may have to adjust what we are going to do to control his atrial fib and will discuss that with cardiology. We discussed smoking cessation and as mentioned he reluctantly agrees to try. Resumption of anticoagulation will depend on when we get him set up for the arteriogram.    LOS: 3 days    Lyberti Thrush L 01/28/2016, 7:30 AM

## 2016-01-28 NOTE — Progress Notes (Signed)
Subjective:  Feels fine. No complaints  Objective:  Vital Signs in the last 24 hours: Temp:  [97.8 F (36.6 C)-98.6 F (37 C)] 98.3 F (36.8 C) (05/31 0400) Pulse Rate:  [30-113] 91 (05/31 0400) Resp:  [14-30] 20 (05/31 0400) BP: (79-115)/(49-80) 115/67 mmHg (05/31 0400) SpO2:  [90 %-100 %] 98 % (05/31 0400) FiO2 (%):  [32 %] 32 % (05/30 2030) Weight:  [117 lb 8.1 oz (53.3 kg)] 117 lb 8.1 oz (53.3 kg) (05/31 0500)  Intake/Output from previous day: 05/30 0701 - 05/31 0700 In: 2738.3 [I.V.:2738.3] Out: 850 [Urine:650; Stool:200] Intake/Output from this shift:    Physical Exam: NECK: Without JVD, HJR, or bruit LUNGS: Clear anterior, posterior, lateral HEART: Regular rate and rhythm, no murmur, gallop, rub, bruit, thrill, or heave EXTREMITIES: Without cyanosis, clubbing, or edema  Tele: Afib 95-111/m  Lab Results:  Recent Labs  01/27/16 01/27/16 0427  WBC 27.0* 25.8*  HGB 8.5* 8.4*  PLT 314 324    Recent Labs  01/26/16 0443 01/27/16  NA 138 137  K 3.0* 3.3*  CL 109 111  CO2 25 21*  GLUCOSE 76 67  BUN 20 18  CREATININE 0.59* 0.50*   No results for input(s): TROPONINI in the last 72 hours.  Invalid input(s): CK, MB Hepatic Function Panel  Recent Labs  01/27/16  PROT 4.6*  ALBUMIN 2.1*  AST 24  ALT 18  ALKPHOS 82  BILITOT 0.8   No results for input(s): CHOL in the last 72 hours. No results for input(s): PROTIME in the last 72 hours.    Cardiac Studies:  Assessment/Plan:  1. Atrial fibrillation: Rates have been uncontrolled and labile since admission but have improved some with blood transfusion and IV metoprolol. Had been on propanolol and digoxin at home. IV metoprolol is currently keeping HR moderately controlled. We are limited currently on medications due to hypotension. Marland Kitchen ?Amio  Dr. Johnsie Cancel mentioned DCCV in prior notes. Will defer this until further information on GIB is obtained.   2. Hypertension: Currently soft in the setting of anemia  and GIB. Has been on labetallol in the past  All antihypertensive medications are on hold with the exception of metoprolol for HR control.   3. GIB: on Xarelto now stopped.. Marked inflammatory changes with ulceration involving most of the more proximal colon with more extreme inflammatory changes extending up into the terminal ileum?status post biopsy. patient with pneumatosis and portal venous air 2 weeks ago. Given findings of the EGD today, my concerns are heightened regarding gut ischemia.  LOS: 3 days    Ermalinda Barrios 01/28/2016, 8:40 AM  Attending Note  Patient afib with RVR. While NPO managed with IV metoprolol, received a dose of digoxin IV 0.5mg  yesterday with improved rates. He has been found to have ischemic bowel and is followed by surgery, from their recs need to avoid significant hypotension, of which patient has intermittently had related to attempts at control of his afib. We will transition him to amiodarone drip today, d/c IV lopressor. No anticoagulation in setting of GI bleed.    Zandra Abts MD

## 2016-01-28 NOTE — Progress Notes (Signed)
Subjective:  No abdominal pain since prior to colonoscopy per patient.  Objective: Vital signs in last 24 hours: Temp:  [97.8 F (36.6 C)-98.6 F (37 C)] 98.3 F (36.8 C) (05/31 0400) Pulse Rate:  [30-113] 91 (05/31 0400) Resp:  [14-30] 20 (05/31 0400) BP: (79-115)/(49-80) 115/67 mmHg (05/31 0400) SpO2:  [90 %-100 %] 98 % (05/31 0400) FiO2 (%):  [32 %] 32 % (05/30 2030) Weight:  [117 lb 8.1 oz (53.3 kg)] 117 lb 8.1 oz (53.3 kg) (05/31 0500) Last BM Date: 01/25/16 General:   Alert,  Well-developed, well-nourished, pleasant and cooperative in NAD. Wife at bedside. Head:  Normocephalic and atraumatic. Eyes:  Sclera clear, no icterus.  Abdomen:  Soft, nontender and nondistended.  Normal bowel sounds, without guarding, and without rebound.   Extremities:  Without clubbing, deformity or edema. Neurologic:  Alert and  oriented x4;  grossly normal neurologically. Skin:  Intact without significant lesions or rashes. Psych:  Alert and cooperative. Normal mood and affect.  Intake/Output from previous day: 05/30 0701 - 05/31 0700 In: 2738.3 [I.V.:2738.3] Out: 850 [Urine:650; Stool:200] Intake/Output this shift:    Lab Results: CBC  Recent Labs  01/26/16 1853 01/27/16 01/27/16 0427  WBC 31.1* 27.0* 25.8*  HGB 9.4* 8.5* 8.4*  HCT 29.7* 26.7* 26.2*  MCV 86.3 85.6 86.2  PLT 376 314 324   BMET  Recent Labs  01/25/16 0850 01/26/16 0443 01/27/16  NA 133* 138 137  K 4.2 3.0* 3.3*  CL 100* 109 111  CO2 24 25 21*  GLUCOSE 146* 76 67  BUN 26* 20 18  CREATININE 0.85 0.59* 0.50*  CALCIUM 8.2* 7.5* 7.4*   LFTs  Recent Labs  01/25/16 0850 01/26/16 0443 01/27/16  BILITOT 0.4 0.6 0.8  ALKPHOS 109 97 82  AST 29 21 24   ALT 23 17 18   PROT 5.5* 4.8* 4.6*  ALBUMIN 2.5* 2.3* 2.1*    Recent Labs  01/25/16 0850  LIPASE 58*   PT/INR  Recent Labs  01/25/16 1215  LABPROT 25.9*  INR 2.40*      Imaging Studies: Ct Abdomen Pelvis W Wo Contrast  01/25/2016  CLINICAL  DATA:  GI bleeding, prior portal venous gas on previous CT EXAM: CT ABDOMEN AND PELVIS WITHOUT AND WITH CONTRAST TECHNIQUE: Multidetector CT imaging of the abdomen and pelvis was performed following the standard protocol before and following the bolus administration of intravenous contrast. CONTRAST:  115mL ISOVUE-300 IOPAMIDOL (ISOVUE-300) INJECTION 61% COMPARISON:  01/11/2016 FINDINGS: Lung bases are free of acute infiltrate or sizable effusion. Gallbladder has been surgically removed. The previously seen portal venous air within the left lobe of the liver is no longer identified. Portal vein is well visualized and demonstrates a normal branching pattern. No portal venous thrombosis is noted. Again no portal venous air is seen. The spleen, right adrenal gland and pancreas are stable in appearance from the prior exam. Small duodenal diverticulum is seen. Left adrenal gland demonstrates some hypertrophy but stable from the prior study. The kidneys are stable in appearance with cystic change and nonobstructing renal calculi bilaterally. The bladder is decompressed by Foley catheter. Prostatic calcifications are seen. No free pelvic fluid is noted. Mild diverticular change is noted without evidence of diverticulitis. No pooling of contrast to correspond with the given clinical history of GI bleeding is noted. Aortoiliac calcifications are seen. Tortuosity of the abdominal aorta is noted. Tube graft is noted within the distal aorta related to prior surgical repair. This is stable from the prior exam. The  appendix is not well visualized although no inflammatory changes are seen. The osseous structures show a scoliosis of the lumbar spine concave to the left. This is stable from the prior exam. No acute bony abnormality is noted. IMPRESSION: Resolution of previously seen portal venous gas. No findings to correspond with the patient's given clinical history of active GI bleeding are seen. Direct visualization may be  helpful. Nuclear bleeding scan may also be helpful for localization Remainder of the exam is stable from the prior study with evidence of bilateral renal calculi and right renal cystic change without obstructive change. Chronic changes as described. Electronically Signed   By: Inez Catalina M.D.   On: 01/25/2016 19:48   Ct Abdomen Pelvis W Contrast  01/11/2016  ADDENDUM REPORT: 01/11/2016 22:37 ADDENDUM: These results were called by telephone at the time of interpretation on <CurrentDate> at <CurrentTime> to Dr. Shanon Brow , who verbally acknowledged these results. Electronically Signed   By: Anner Crete M.D.   On: 01/11/2016 22:37  01/11/2016  CLINICAL DATA:  75 year old male with bilateral lower quadrant abdominal pain. EXAM: CT ABDOMEN AND PELVIS WITH CONTRAST TECHNIQUE: Multidetector CT imaging of the abdomen and pelvis was performed using the standard protocol following bolus administration of intravenous contrast. CONTRAST:  177mL ISOVUE-300 IOPAMIDOL (ISOVUE-300) INJECTION 61% COMPARISON:  None. FINDINGS: The visualized lung bases are clear. There is coronary vascular calcification. No intra-abdominal free air or free fluid. Cholecystectomy. Linear and branching air noted within the peripheral left lobe of the liver concerning for of the portal venous gas. The liver is otherwise unremarkable. The pancreas, spleen, and the right adrenal gland appear unremarkable. There is mild thickening of the left adrenal gland. Multiple small nonobstructing bilateral renal calculi measuring up to 4 mm. There is mild fullness of the renal collecting systems bilaterally. There is a 5 mm calculus in the proximal right ureter. There is a 2.6 cm exophytic hypodense lesion arising from the posterior cortex of the right kidney most compatible with a cyst. The urinary bladder is distended. There is trabecular appearance of the bladder wall compatible with chronic bladder outlet obstruction. The prostate gland is mildly enlarged  and measures 4.5 cm in transverse diameter. There is apparent thickening of the distal stomach in the region of the antrum and pylorus which may be related to underdistention or represent gastritis. Clinical correlation is recommended. Multiple small duodenal diverticula without definite active inflammation. There is no evidence of bowel obstruction. The visualized appendix appears unremarkable. There is aortoiliac atherosclerotic disease. There is focal area of advanced atherosclerotic plaque with narrowing of the aorta just inferior to the origins of the renal arteries. The aorta is tortuous. There is atherosclerotic calcifications of the origins of the celiac axis and SMA. The origins the celiac axis, SMA appear patent. The SMV, and main portal vein are patent. No portal venous gas identified. There is no adenopathy. Set the abdominal wall soft tissues appear unremarkable. There is osteopenia with degenerative changes of the spine. No acute fracture. IMPRESSION: Linear branching air in the left lobe of the liver concerning for portal venous gas. This is of indeterminate etiology, however the possibility of ischemic bowel is not excluded. Clinical correlation is recommended. Underdistention of the stomach versus gastritis. No evidence of bowel obstruction. No CT evidence for acute appendicitis. A 5 mm proximal right ureteral calculus with small nonobstructing bilateral renal calculi. Mild fullness of the renal collecting systems bilaterally. Trabecular appearance of the urinary bladder compatible with chronic bladder outlet obstruction. Electronically Signed:  By: Anner Crete M.D. On: 01/11/2016 22:26   Dg Chest Port 1 View  01/13/2016  CLINICAL DATA:  R06.02 (ICD-10-CM) - SOB (shortness of breath)COPD (chronic obstructive pulmonary disease) ([place]) J44.9Smokes 1ppd/60 yrs EXAM: PORTABLE CHEST 1 VIEW COMPARISON:  Chest CT, 07/23/2015.  Chest radiograph, 02/06/2015. FINDINGS: There is new opacity at the right  lung base when compared to the prior studies silhouetting the right hemidiaphragm. Mild opacities noted in the medial left lung base. Remainder of the lungs is clear. Left lung is hyperexpanded. No pneumothorax. Cardiac silhouette is normal in size. No mediastinal or hilar masses or convincing adenopathy. Bony thorax is demineralized but grossly intact. IMPRESSION: 1. New bilateral lung base opacity, greater on the right. This may be atelectasis, pneumonia or a combination. A central obstructing lesion is not excluded. Consider followup chest CT with contrast for further assessment. 2. No evidence of pulmonary edema. Electronically Signed   By: Lajean Manes M.D.   On: 01/13/2016 11:21   Dg Abd Acute W/chest  01/25/2016  CLINICAL DATA:  Diarrhea.  Abdominal pain.  Weakness. EXAM: DG ABDOMEN ACUTE W/ 1V CHEST COMPARISON:  01/13/2016. FINDINGS: Borderline enlarged cardiac silhouette. Clear lungs. Normal bowel gas pattern without free peritoneal air. Cholecystectomy clips. 6 mm nonspecific oval calcification overlying the right lower abdomen medially. Marked dextroconvex lumbar rotary scoliosis and degenerative changes. Mild to moderate thoracic scoliosis and degenerative changes. Mild left shoulder degenerative changes. Diffuse osteopenia. Atheromatous arterial calcifications. IMPRESSION: 1. No acute abnormality. 2. 6 mm oval calcification overlying the right lower abdomen, medially. This could potentially represent a ureteral calculus or phlebolith. An appendicolith is less likely. 3. Thoracolumbar scoliosis and degenerative changes. Electronically Signed   By: Claudie Revering M.D.   On: 01/25/2016 10:18  [2 weeks]   Assessment: 75 year old gentleman presented with hemodynamically significant GI bleed in the setting of recently initiated anticoagulation therapy for new onset atrial fibrillation. Anticoagulation therapy reversed with Kcentra at admission. Patient has received 2 units of packed blood cells. Area  of pneumatosis in the small bowel seen on prior CT, no evidence of aortoenteric fistula. He does have significant mesenteric atherosclerosis.  Ileocolonoscopy showed marked inflammatory changes with ulceration involving most of the more proximal colon with more extreme inflammatory changes extending up into the terminal ileum. Status post biopsies. Upper endoscopy showed markedly abnormal stomach and duodenum with geographic ulceration and erosions in the stomach, mucosa edematous with marked friability. Inflammatory changes extended down into the fourth portion of duodenum became more pronounced with ulceration distally. Concerning for ischemic process.  Significant leukocytosis and recent pneumatosis noted on prior CT-now not evident on current CT. Abdominal exam is entirely benign. C. difficile antigen positive in the setting of diarrhea following hospitalization and antibiotic exposure. Vancomycin started (no colitis on CT).Leukocytosis could be due to C. difficile or urinary tract infection in the setting of the right obstructive uropathy.  Plan: 1. Await pending biopsies. 2. Monitor for abdominal pain. 3. Maximize blood flow state, appreciate cardiology input. 4. Continue vancomycin.  Laureen Ochs. Bernarda Caffey [facility] [phone] 5/31/20171:16 PM     LOS: 3 days

## 2016-01-29 ENCOUNTER — Inpatient Hospital Stay (HOSPITAL_COMMUNITY): Payer: Commercial Managed Care - HMO

## 2016-01-29 DIAGNOSIS — A09 Infectious gastroenteritis and colitis, unspecified: Secondary | ICD-10-CM

## 2016-01-29 LAB — CBC WITH DIFFERENTIAL/PLATELET
BASOS ABS: 0 10*3/uL (ref 0.0–0.1)
Basophils Relative: 0 %
Eosinophils Absolute: 0 10*3/uL (ref 0.0–0.7)
Eosinophils Relative: 0 %
HEMATOCRIT: 26.6 % — AB (ref 39.0–52.0)
Hemoglobin: 8.2 g/dL — ABNORMAL LOW (ref 13.0–17.0)
LYMPHS PCT: 7 %
Lymphs Abs: 1.1 10*3/uL (ref 0.7–4.0)
MCH: 26.9 pg (ref 26.0–34.0)
MCHC: 30.8 g/dL (ref 30.0–36.0)
MCV: 87.2 fL (ref 78.0–100.0)
MONO ABS: 1.5 10*3/uL — AB (ref 0.1–1.0)
Monocytes Relative: 9 %
NEUTROS ABS: 13.4 10*3/uL — AB (ref 1.7–7.7)
Neutrophils Relative %: 84 %
Platelets: 265 10*3/uL (ref 150–400)
RBC: 3.05 MIL/uL — AB (ref 4.22–5.81)
RDW: 16.9 % — ABNORMAL HIGH (ref 11.5–15.5)
WBC: 16.1 10*3/uL — ABNORMAL HIGH (ref 4.0–10.5)

## 2016-01-29 LAB — BASIC METABOLIC PANEL
ANION GAP: 3 — AB (ref 5–15)
BUN: 8 mg/dL (ref 6–20)
CO2: 25 mmol/L (ref 22–32)
Calcium: 7.2 mg/dL — ABNORMAL LOW (ref 8.9–10.3)
Chloride: 111 mmol/L (ref 101–111)
Creatinine, Ser: 0.42 mg/dL — ABNORMAL LOW (ref 0.61–1.24)
GFR calc Af Amer: >60 mL/min (ref >60)
GFR calc non Af Amer: >60 mL/min (ref >60)
GLUCOSE: 101 mg/dL — AB (ref 65–99)
POTASSIUM: 3 mmol/L — AB (ref 3.5–5.1)
Sodium: 139 mmol/L (ref 135–145)

## 2016-01-29 IMAGING — CT CT CHEST W/O CM
2 of 3 series · 15 of 36 positions shown, 18 images · non-contrast
Comparison: CT chest of 03/27/2015

CLINICAL DATA: Followup of left lower lobe pulmonary nodule,
history of thoracic aortic aneurysm, history of left shoulder
fracture 2 weeks ago

EXAM:
CT CHEST WITHOUT CONTRAST
TECHNIQUE: Multidetector CT imaging of the chest was performed following the
standard protocol without IV contrast.

[Series 2: chestroutine 5.0 b40f · axial · 0.75mm/px · z∈[-442,-102]mm · 12 of 80 slices shown, 15 images]
[im 6/80  mediastinal]
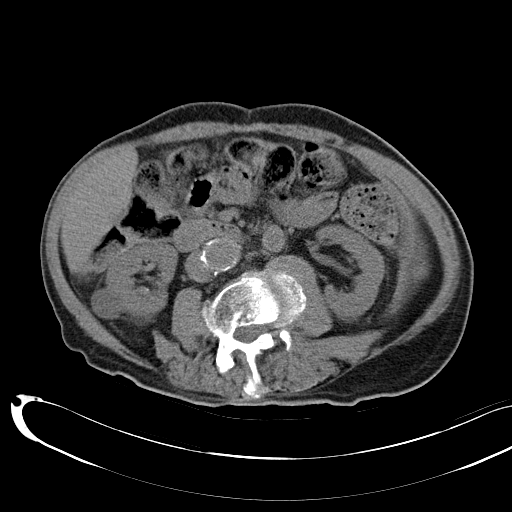
[im 6/80  lung]
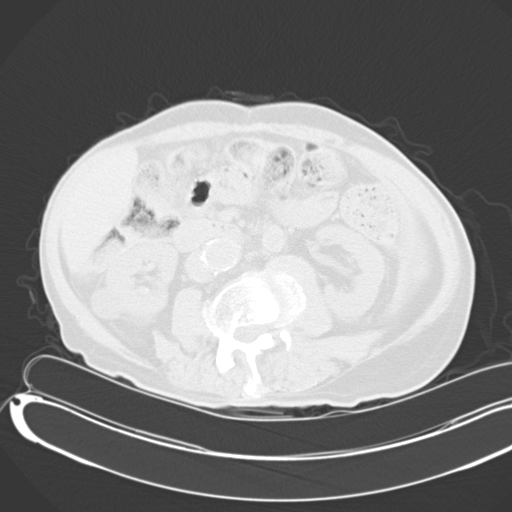
[im 12/80  lung]
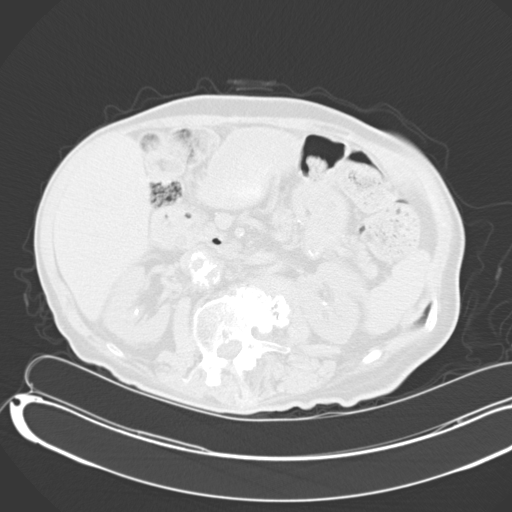
[im 18/80  lung]
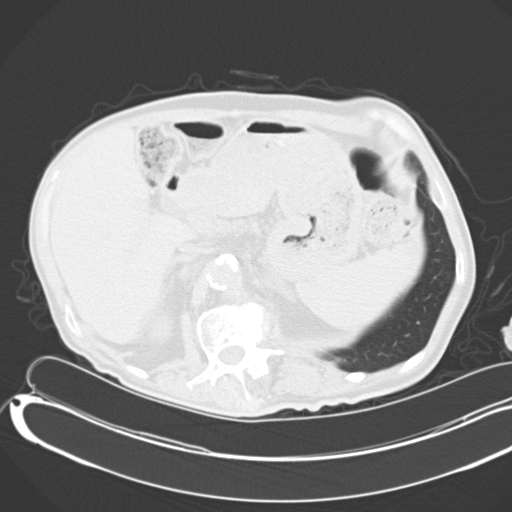
[im 24/80  lung]
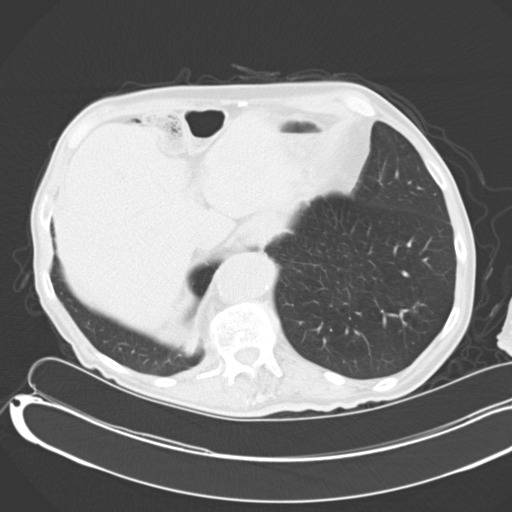
[im 30/80  mediastinal]
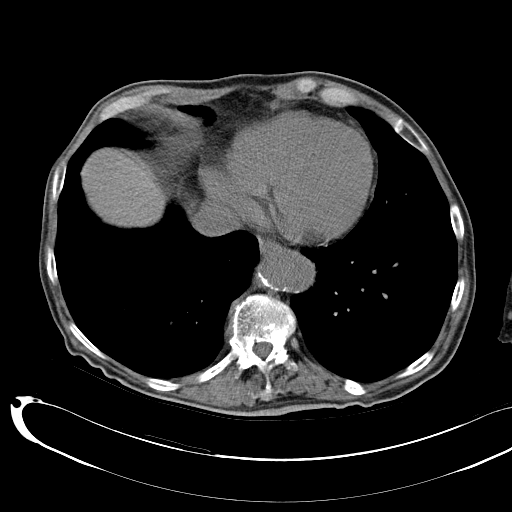
[im 30/80  lung]
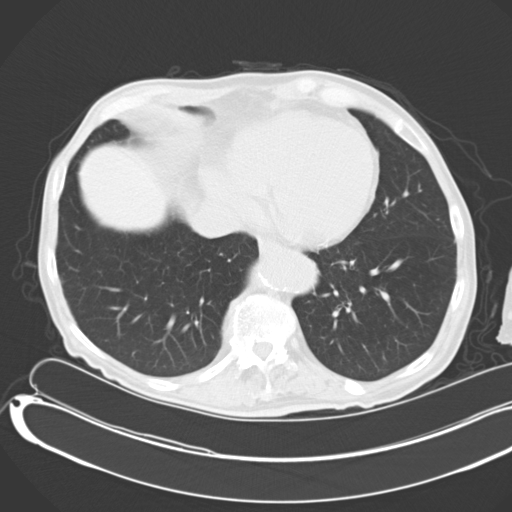
[im 36/80  lung]
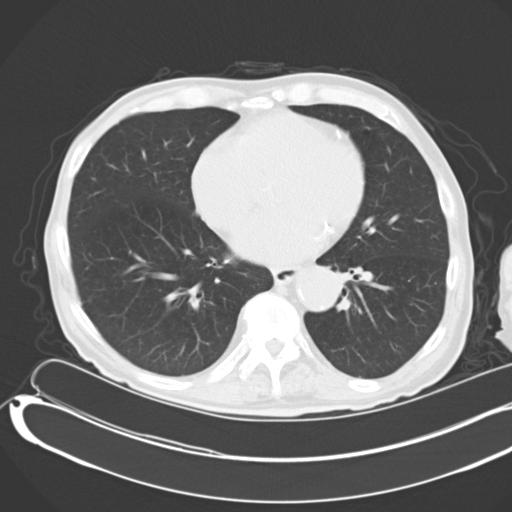
[im 44/80  lung]
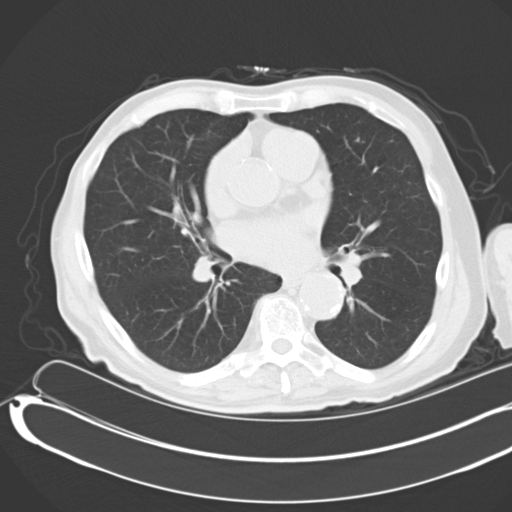
[im 50/80  lung]
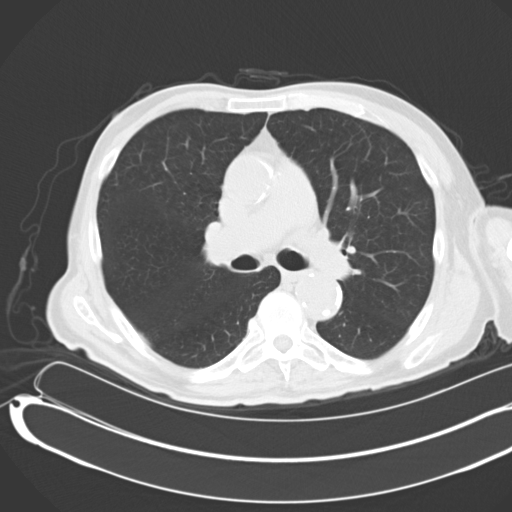
[im 56/80  mediastinal]
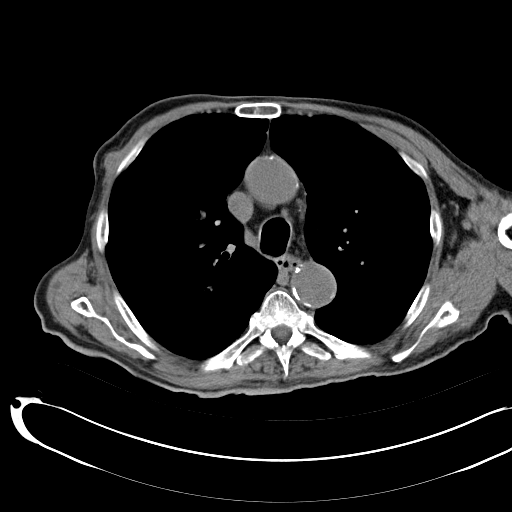
[im 56/80  lung]
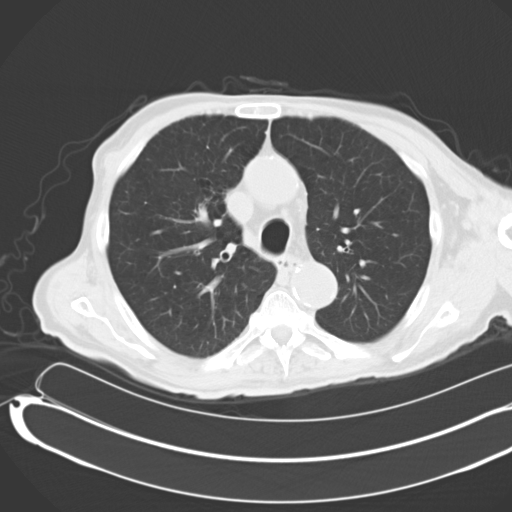
[im 62/80  lung]
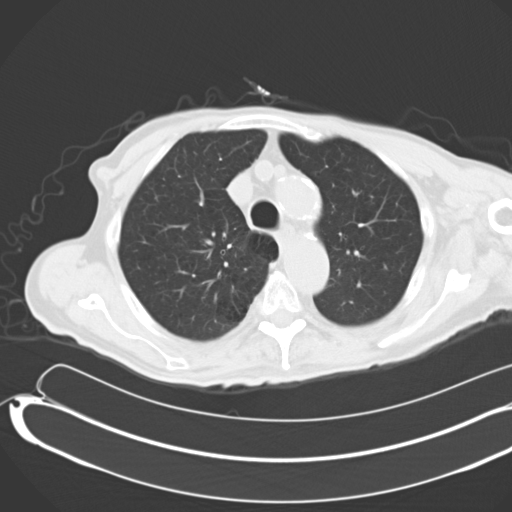
[im 68/80  lung]
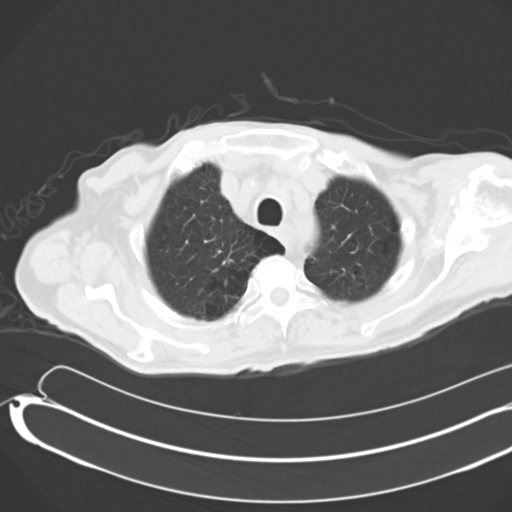
[im 74/80  lung]
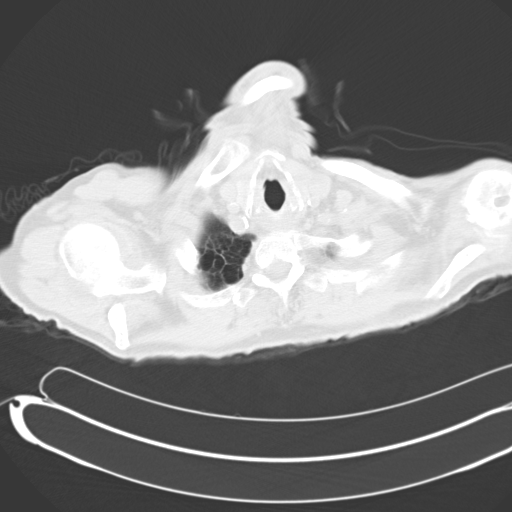

[Series 4: mpr coro 3mm · coronal · 0.70mm/px · 3 of 82 slices shown]
[im 17/82  lung]
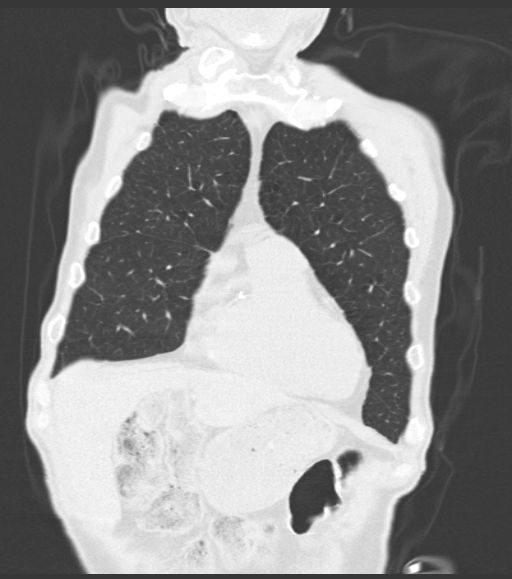
[im 33/82  lung]
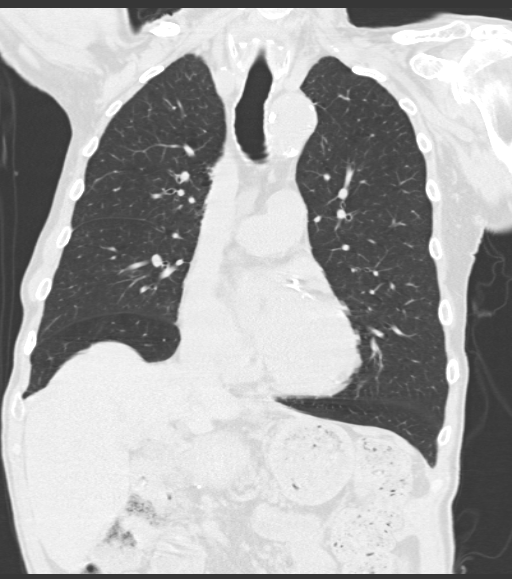
[im 49/82  lung]
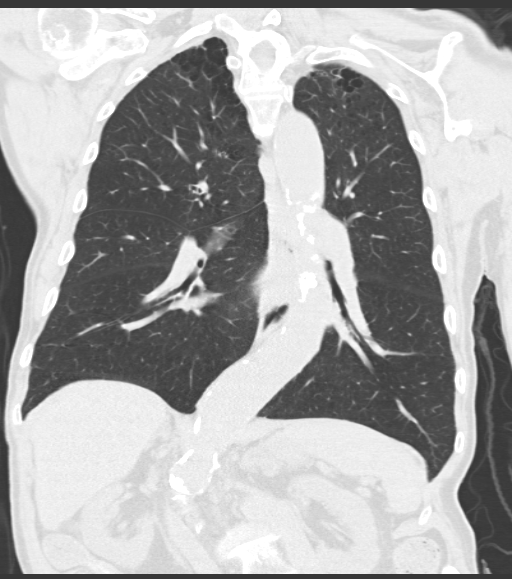

[15 of 36 positions shown; findings below may reference images not displayed]

FINDINGS: There are changes of centrilobular emphysema primarily involving the
upper lobes to the apices. The poorly defined cluster of small
nodules in the left lower lobe on the prior study has resolved.
There is a single small calcification near that site which may
represent prior granulomatous disease. No new or enlarging lung
nodule is seen. No pleural effusion is noted. The central airway is
patent.

On soft tissue window images, the thyroid gland is unremarkable. On
this unenhanced study, no mediastinal or hilar adenopathy is seen.
The mid ascending aorta measures 3.5 cm diameter with considerable
atherosclerotic calcification. Calcifications are present throughout
the aortic arch and the entirety of the descending thoracic aorta.
No focal aneurysmal dilatation is seen. The descending thoracic
aorta is somewhat ectatic. Diffuse three-vessel coronary artery
calcifications are present and cardiomegaly is noted. Within the
upper abdomen several small bilateral nonobstructing renal calculi
are present. Surgical clips are noted from prior cholecystectomy.

There is moderately severe thoracolumbar scoliosis present with
diffuse degenerative change in the lower thoracic and upper lumbar
spine.
IMPRESSION: 1. The small collection of poorly defined nodular opacities in the
left lower lobe noted previously has completely cleared. No further
followup is necessary.
2. Centrilobular emphysema.
3. Moderately severe thoracolumbar scoliosis with degenerative
change.
4. Small nonobstructing bilateral renal calculi.
5. Diffuse three-vessel coronary artery calcifications.

## 2016-01-29 MED ORDER — POTASSIUM CHLORIDE CRYS ER 20 MEQ PO TBCR
40.0000 meq | EXTENDED_RELEASE_TABLET | ORAL | Status: AC
  Administered 2016-01-29: 40 meq via ORAL
  Filled 2016-01-29: qty 2

## 2016-01-29 MED ORDER — POTASSIUM CHLORIDE CRYS ER 20 MEQ PO TBCR
20.0000 meq | EXTENDED_RELEASE_TABLET | Freq: Every day | ORAL | Status: DC
Start: 2016-01-30 — End: 2016-01-31
  Administered 2016-01-30: 20 meq via ORAL
  Filled 2016-01-29: qty 1

## 2016-01-29 MED ORDER — IOPAMIDOL (ISOVUE-370) INJECTION 76%
100.0000 mL | Freq: Once | INTRAVENOUS | Status: AC | PRN
  Administered 2016-01-29: 100 mL via INTRAVENOUS

## 2016-01-29 MED ORDER — MAGNESIUM OXIDE 400 (241.3 MG) MG PO TABS
200.0000 mg | ORAL_TABLET | Freq: Two times a day (BID) | ORAL | Status: DC
  Administered 2016-01-29 – 2016-02-03 (×12): 200 mg via ORAL
  Filled 2016-01-29 (×12): qty 1

## 2016-01-29 NOTE — Progress Notes (Addendum)
Primary Cardiologist: Jenkins Rouge MD  Cardiology Specific Problem List: 1. Atrial fibrillation 2. Hypertension   Subjective:    Feeling better. Eating now. Generalized fatigue. No complaints of further bleeding   Objective:   Temp:  [98 F (36.7 C)-98.7 F (37.1 C)] 98.7 F (37.1 C) (06/01 0400) Pulse Rate:  [60-173] 106 (06/01 0500) Resp:  [15-26] 19 (06/01 0500) BP: (102-130)/(58-84) 121/80 mmHg (06/01 0500) SpO2:  [94 %-100 %] 97 % (06/01 0500) Weight:  [117 lb 1 oz (53.1 kg)] 117 lb 1 oz (53.1 kg) (06/01 0500) Last BM Date: 01/28/16  Filed Weights   01/27/16 0458 01/28/16 0500 01/29/16 0500  Weight: 115 lb 1.3 oz (52.2 kg) 117 lb 8.1 oz (53.3 kg) 117 lb 1 oz (53.1 kg)    Intake/Output Summary (Last 24 hours) at 01/29/16 0759 Last data filed at 01/29/16 0500  Gross per 24 hour  Intake 3849.6 ml  Output   1750 ml  Net 2099.6 ml    Telemetry: Atrial fibrillation rates in the 90's - 115 bpm. Occasional PVC  Exam:  General: No acute distress.  HEENT: Conjunctiva and lids normal, oropharynx clear.  Lungs:  Bibasilar crackles, no wheezes or rhonchi. Wearing O2 [place]  Cardiac: No elevated JVP or bruits. iRRR, no gallop or rub.   Abdomen: Normoactive bowel sounds, nontender, nondistended.  Extremities: No pitting edema, distal pulses full.  Neuropsychiatric: Alert and oriented x3, affect appropriate.   Lab Results:  Basic Metabolic Panel:  Recent Labs Lab 01/27/16 01/28/16 0859 01/29/16 0509  NA 137 139 139  K 3.3* 3.0* 3.0*  CL 111 111 111  CO2 21* 24 25  GLUCOSE 67 88 101*  BUN 18 14 8   CREATININE 0.50* 0.46* 0.42*  CALCIUM 7.4* 7.5* 7.2*    Liver Function Tests:  Recent Labs Lab 01/25/16 0850 01/26/16 0443 01/27/16  AST 29 21 24   ALT 23 17 18   ALKPHOS 109 97 82  BILITOT 0.4 0.6 0.8  PROT 5.5* 4.8* 4.6*  ALBUMIN 2.5* 2.3* 2.1*    CBC:  Recent Labs Lab 01/27/16 0427 01/28/16 0859 01/29/16 0509  WBC 25.8* 22.2* 16.1*   HGB 8.4* 9.1* 8.2*  HCT 26.2* 28.9* 26.6*  MCV 86.2 87.6 87.2  PLT 324 305 265     Coagulation:  Recent Labs Lab 01/25/16 1215  INR 2.40*    Medications:   Scheduled Medications: . antiseptic oral rinse  7 mL Mouth Rinse BID  . nicotine  21 mg Transdermal Daily  . sodium chloride flush  3 mL Intravenous Q12H  . vancomycin  125 mg Oral Q6H    Infusions: . sodium chloride 100 mL/hr at 01/29/16 0000  . amiodarone 30 mg/hr (01/28/16 2116)    PRN Medications: acetaminophen **OR** acetaminophen, morphine injection, ondansetron **OR** ondansetron (ZOFRAN) IV   Assessment and Plan:   1. Atrial fib: Rate is better controlled. Continues on amiodarone gtt at 30 mg hour. He is not on po bland diet. Consider transitioning to po amiodarone now. No anticoagulation due to GI bleed.   2. Hypertension: Currently stable. IV lopressor has been discontinued to avoid hypotension.   3.GIB: Bleeding has resolved Ileocolonoscopy showed significant inflammatory changes with ulceration of the proximal colon, with extreme inflammatory changes extending up into the terminal ileum. Marked friability found in the stomach. Surgery has recommended CT of the abdomen and pelvis.   4. Anemia: Hgb has dropped from 9.1 to 8.2 without obvious bleeding.   5, Hypokalemia: Will give po  40 mEq this am, and continue 20 mEq daily thereafter. Magnesium 1.6 on 01/15/2016. Will provide daily replacement 200 mg BID. Follow up BMET.   Phill Myron. Lawrence NP [place]  01/29/2016, 7:59 AM   Attending Note Patient seen and discussed with NP Purcell Nails, I agree with her documentation. Afib with RVR, had issues with low bp's with rate control strategy. Due to ischemic bowel looking to avoid hypotension, we changed him to amio drip yesterday. BP's much improved.  He remains in afib this AM with reasonable rate control. Off anticoag in setting of GI bleed. From primary team notes plans for CTA abdomen today. We will continue amio  gtt today, I suspect with his GI issues he has some decreased absorption, continue IV amio for today and consider change to oral tomorrow.   Zandra Abts MD

## 2016-01-29 NOTE — Progress Notes (Signed)
[place] Hospital Day(s): 4.   Post op day(s): 2 Days Post-Op.   Interval History: Patient seen and examined, no acute events or new complaints overnight. Patient denies any further bleeding or with bowel movements and denies abdominal pain, CP, SOB, or fever/chills.  Review of Systems:  Constitutional: denies fever, chills  HEENT: denies cough or congestion  Respiratory: denies any shortness of breath  Cardiovascular: denies chest pain or palpitations  Gastrointestinal: denies N/V, diarrhea or constipation  Musculoskeletal: denies pain, decreased motor or sensation  Neurological: denies HA or vision/hearing changes   Vital signs in last 24 hours: [min-max] current  Temp:  [97.5 F (36.4 C)-98.7 F (37.1 C)] 97.9 F (36.6 C) (06/01 1124) Pulse Rate:  [58-173] 85 (06/01 0800) Resp:  [15-26] 23 (06/01 0800) BP: (102-130)/(58-84) 114/63 mmHg (06/01 0800) SpO2:  [94 %-99 %] 98 % (06/01 0800) Weight:  [53.1 kg (117 lb 1 oz)] 53.1 kg (117 lb 1 oz) (06/01 0500)     Height: 5\' 4"  (162.6 cm) Weight: 53.1 kg (117 lb 1 oz) BMI (Calculated): 19.3   Intake/Output this shift:  Total I/O In: 1003.4 [P.O.:240; I.V.:763.4] Out: -    Intake/Output last 2 shifts:  @IOLAST2SHIFTS @   Physical Exam:  Constitutional: alert, cooperative and no distress  HENT: normocephalic without obvious abnormality  Eyes: PERRL, EOM's grossly intact and symmetric  Neuro: CN II - XII grossly intact and symmetric without deficit  Respiratory: breathing non-labored at rest  Cardiovascular: regular rate and sinus rhythm  Gastrointestinal: soft, non-tender, and non-distended  Musculoskeletal: UE and LE FROM, no edema or wounds, motor and sensation grossly intact, NT    Labs:  CBC:  Lab Results  Component Value Date   WBC 16.1* 01/29/2016   RBC 3.05* 01/29/2016   BMP:  Lab Results  Component Value Date   GLUCOSE 101* 01/29/2016   CO2 25 01/29/2016   BUN 8 01/29/2016   CREATININE  0.42* 01/29/2016   CALCIUM 7.2* 01/29/2016     Imaging studies: No new pertinent imaging studies    Assessment/Plan:  75 y.o. male with GI bleed and chronic mesenteric ischemia secondary to SMA and celiac arterial stenoses s/p open transabdominal aortic reconstruction for AAA with occlusion of IMA, complicated by what appears likely a low-flow state (recent and current hypotension) in association with new onset atrial fibrillation and complicated by comorbidities including COPD, hypertension, anticoagulation for atrial fibrillation, AAA s/p repair, atherosclerosis, and tobacco abuse.  - Avoid hypotension - Anticoagulation for atrial fibrillation without prior embolization currently held  - CTA abdomen and pelvis with thin cuts could offer better imaging of the SMA and celiac artery, but angiography offers most of the same diagnostic information along with an opportunity to intervene  - CTA abdomen and pelvis ordered, will follow up - DVT prophylaxis  All of the above findings and recommendations were discussed with the patient and his wife, and all of their questions were answered to their expressed satisfaction.  -- Marilynne Drivers Rosana Hoes, [place]: [place] [facility] Office: [phone]

## 2016-01-29 NOTE — Progress Notes (Signed)
Subjective:  Per nursing, no active gi bleeding. Patient feeling better. No specific complaints.   Objective: Vital signs in last 24 hours: Temp:  [97.5 F (36.4 C)-98.7 F (37.1 C)] 97.5 F (36.4 C) (06/01 0824) Pulse Rate:  [58-173] 85 (06/01 0800) Resp:  [15-26] 23 (06/01 0800) BP: (102-130)/(58-84) 114/63 mmHg (06/01 0800) SpO2:  [94 %-100 %] 98 % (06/01 0800) Weight:  [117 lb 1 oz (53.1 kg)] 117 lb 1 oz (53.1 kg) (06/01 0500) Last BM Date: 01/29/16 General:   Alert,  Well-developed, well-nourished, pleasant and cooperative in NAD Eyes:  Sclera clear, no icterus.  Abdomen:  Soft, nontender and nondistended.   Neurologic:  Alert and  oriented x4;  grossly normal neurologically. Psych:  Alert and cooperative. Normal mood and affect.  Intake/Output from previous day: 05/31 0701 - 06/01 0700 In: 3866.3 [P.O.:1400; I.V.:2466.3] Out: 1750 [Urine:1750] Intake/Output this shift: Total I/O In: 1003.4 [P.O.:240; I.V.:763.4] Out: -   Lab Results: CBC  Recent Labs  01/27/16 0427 01/28/16 0859 01/29/16 0509  WBC 25.8* 22.2* 16.1*  HGB 8.4* 9.1* 8.2*  HCT 26.2* 28.9* 26.6*  MCV 86.2 87.6 87.2  PLT 324 305 265   BMET  Recent Labs  01/27/16 01/28/16 0859 01/29/16 0509  NA 137 139 139  K 3.3* 3.0* 3.0*  CL 111 111 111  CO2 21* 24 25  GLUCOSE 67 88 101*  BUN 18 14 8   CREATININE 0.50* 0.46* 0.42*  CALCIUM 7.4* 7.5* 7.2*   LFTs  Recent Labs  01/27/16  BILITOT 0.8  ALKPHOS 82  AST 24  ALT 18  PROT 4.6*  ALBUMIN 2.1*   No results for input(s): LIPASE in the last 72 hours. PT/INR No results for input(s): LABPROT, INR in the last 72 hours.    Imaging Studies: Ct Abdomen Pelvis W Wo Contrast  01/25/2016  CLINICAL DATA:  GI bleeding, prior portal venous gas on previous CT EXAM: CT ABDOMEN AND PELVIS WITHOUT AND WITH CONTRAST TECHNIQUE: Multidetector CT imaging of the abdomen and pelvis was performed following the standard protocol before and following the bolus  administration of intravenous contrast. CONTRAST:  134mL ISOVUE-300 IOPAMIDOL (ISOVUE-300) INJECTION 61% COMPARISON:  01/11/2016 FINDINGS: Lung bases are free of acute infiltrate or sizable effusion. Gallbladder has been surgically removed. The previously seen portal venous air within the left lobe of the liver is no longer identified. Portal vein is well visualized and demonstrates a normal branching pattern. No portal venous thrombosis is noted. Again no portal venous air is seen. The spleen, right adrenal gland and pancreas are stable in appearance from the prior exam. Small duodenal diverticulum is seen. Left adrenal gland demonstrates some hypertrophy but stable from the prior study. The kidneys are stable in appearance with cystic change and nonobstructing renal calculi bilaterally. The bladder is decompressed by Foley catheter. Prostatic calcifications are seen. No free pelvic fluid is noted. Mild diverticular change is noted without evidence of diverticulitis. No pooling of contrast to correspond with the given clinical history of GI bleeding is noted. Aortoiliac calcifications are seen. Tortuosity of the abdominal aorta is noted. Tube graft is noted within the distal aorta related to prior surgical repair. This is stable from the prior exam. The appendix is not well visualized although no inflammatory changes are seen. The osseous structures show a scoliosis of the lumbar spine concave to the left. This is stable from the prior exam. No acute bony abnormality is noted. IMPRESSION: Resolution of previously seen portal venous gas. No findings to  correspond with the patient's given clinical history of active GI bleeding are seen. Direct visualization may be helpful. Nuclear bleeding scan may also be helpful for localization Remainder of the exam is stable from the prior study with evidence of bilateral renal calculi and right renal cystic change without obstructive change. Chronic changes as described.  Electronically Signed   By: Inez Catalina M.D.   On: 01/25/2016 19:48   Ct Abdomen Pelvis W Contrast  01/11/2016  ADDENDUM REPORT: 01/11/2016 22:37 ADDENDUM: These results were called by telephone at the time of interpretation on <CurrentDate> at <CurrentTime> to Dr. Shanon Brow , who verbally acknowledged these results. Electronically Signed   By: Anner Crete M.D.   On: 01/11/2016 22:37  01/11/2016  CLINICAL DATA:  75 year old male with bilateral lower quadrant abdominal pain. EXAM: CT ABDOMEN AND PELVIS WITH CONTRAST TECHNIQUE: Multidetector CT imaging of the abdomen and pelvis was performed using the standard protocol following bolus administration of intravenous contrast. CONTRAST:  161mL ISOVUE-300 IOPAMIDOL (ISOVUE-300) INJECTION 61% COMPARISON:  None. FINDINGS: The visualized lung bases are clear. There is coronary vascular calcification. No intra-abdominal free air or free fluid. Cholecystectomy. Linear and branching air noted within the peripheral left lobe of the liver concerning for of the portal venous gas. The liver is otherwise unremarkable. The pancreas, spleen, and the right adrenal gland appear unremarkable. There is mild thickening of the left adrenal gland. Multiple small nonobstructing bilateral renal calculi measuring up to 4 mm. There is mild fullness of the renal collecting systems bilaterally. There is a 5 mm calculus in the proximal right ureter. There is a 2.6 cm exophytic hypodense lesion arising from the posterior cortex of the right kidney most compatible with a cyst. The urinary bladder is distended. There is trabecular appearance of the bladder wall compatible with chronic bladder outlet obstruction. The prostate gland is mildly enlarged and measures 4.5 cm in transverse diameter. There is apparent thickening of the distal stomach in the region of the antrum and pylorus which may be related to underdistention or represent gastritis. Clinical correlation is recommended. Multiple small  duodenal diverticula without definite active inflammation. There is no evidence of bowel obstruction. The visualized appendix appears unremarkable. There is aortoiliac atherosclerotic disease. There is focal area of advanced atherosclerotic plaque with narrowing of the aorta just inferior to the origins of the renal arteries. The aorta is tortuous. There is atherosclerotic calcifications of the origins of the celiac axis and SMA. The origins the celiac axis, SMA appear patent. The SMV, and main portal vein are patent. No portal venous gas identified. There is no adenopathy. Set the abdominal wall soft tissues appear unremarkable. There is osteopenia with degenerative changes of the spine. No acute fracture. IMPRESSION: Linear branching air in the left lobe of the liver concerning for portal venous gas. This is of indeterminate etiology, however the possibility of ischemic bowel is not excluded. Clinical correlation is recommended. Underdistention of the stomach versus gastritis. No evidence of bowel obstruction. No CT evidence for acute appendicitis. A 5 mm proximal right ureteral calculus with small nonobstructing bilateral renal calculi. Mild fullness of the renal collecting systems bilaterally. Trabecular appearance of the urinary bladder compatible with chronic bladder outlet obstruction. Electronically Signed: By: Anner Crete M.D. On: 01/11/2016 22:26   Dg Chest Port 1 View  01/13/2016  CLINICAL DATA:  R06.02 (ICD-10-CM) - SOB (shortness of breath)COPD (chronic obstructive pulmonary disease) ([place]) J44.9Smokes 1ppd/60 yrs EXAM: PORTABLE CHEST 1 VIEW COMPARISON:  Chest CT, 07/23/2015.  Chest radiograph,  02/06/2015. FINDINGS: There is new opacity at the right lung base when compared to the prior studies silhouetting the right hemidiaphragm. Mild opacities noted in the medial left lung base. Remainder of the lungs is clear. Left lung is hyperexpanded. No pneumothorax. Cardiac silhouette is normal in size.  No mediastinal or hilar masses or convincing adenopathy. Bony thorax is demineralized but grossly intact. IMPRESSION: 1. New bilateral lung base opacity, greater on the right. This may be atelectasis, pneumonia or a combination. A central obstructing lesion is not excluded. Consider followup chest CT with contrast for further assessment. 2. No evidence of pulmonary edema. Electronically Signed   By: Lajean Manes M.D.   On: 01/13/2016 11:21   Dg Abd Acute W/chest  01/25/2016  CLINICAL DATA:  Diarrhea.  Abdominal pain.  Weakness. EXAM: DG ABDOMEN ACUTE W/ 1V CHEST COMPARISON:  01/13/2016. FINDINGS: Borderline enlarged cardiac silhouette. Clear lungs. Normal bowel gas pattern without free peritoneal air. Cholecystectomy clips. 6 mm nonspecific oval calcification overlying the right lower abdomen medially. Marked dextroconvex lumbar rotary scoliosis and degenerative changes. Mild to moderate thoracic scoliosis and degenerative changes. Mild left shoulder degenerative changes. Diffuse osteopenia. Atheromatous arterial calcifications. IMPRESSION: 1. No acute abnormality. 2. 6 mm oval calcification overlying the right lower abdomen, medially. This could potentially represent a ureteral calculus or phlebolith. An appendicolith is less likely. 3. Thoracolumbar scoliosis and degenerative changes. Electronically Signed   By: Claudie Revering M.D.   On: 01/25/2016 10:18  [2 weeks]   Assessment: 75 year old gentleman presented with hemodynamically significant GI bleed in the setting of recently initiated anticoagulation therapy for new onset atrial fibrillation. Anticoagulation therapy reversed with Kcentra at admission. Patient has received 2 units of packed blood cells. Area of pneumatosis in the small bowel seen on prior CT, no evidence of aortoenteric fistula. He does have significant mesenteric atherosclerosis.  Ileocolonoscopy showed marked inflammatory changes with ulceration involving most of the more proximal  colon with more extreme inflammatory changes extending up into the terminal ileum. Status post biopsies. Upper endoscopy showed markedly abnormal stomach and duodenum with geographic ulceration and erosions in the stomach, mucosa edematous with marked friability. Inflammatory changes extended down into the fourth portion of duodenum became more pronounced with ulceration distally. Concerning for ischemic process.  Significant leukocytosis and recent pneumatosis noted on prior CT-now not evident on current CT. Abdominal exam is entirely benign. C. difficile antigen positive in the setting of diarrhea following hospitalization and antibiotic exposure. Vancomycin started (no colitis on CT).Leukocytosis could be due to C. difficile or urinary tract infection in the setting of the right obstructive uropathy. Leukocytosis continues to improve.  Plan: 1. Await pending biopsies. 2. Continue to maximize blood flow state, appreciate cardiology involvement. 3. Continue vancomycin. 4. CTA abdomen planned for today.  Laureen Ochs. Bernarda Caffey [facility] [phone] 6/1/201711:25 AM     LOS: 4 days

## 2016-01-29 NOTE — Progress Notes (Signed)
Subjective: He says he feels better. He is now on amiodarone and his blood pressure is significantly better. He was able to tolerate full liquids yesterday. No further bleeding has been noted. He has no abdominal pain. No chest pain.  Objective: Vital signs in last 24 hours: Temp:  [98 F (36.7 C)-98.7 F (37.1 C)] 98.7 F (37.1 C) (06/01 0400) Pulse Rate:  [60-173] 106 (06/01 0500) Resp:  [15-26] 19 (06/01 0500) BP: (102-130)/(58-84) 121/80 mmHg (06/01 0500) SpO2:  [94 %-100 %] 97 % (06/01 0500) Weight:  [53.1 kg (117 lb 1 oz)] 53.1 kg (117 lb 1 oz) (06/01 0500) Weight change: -0.2 kg (-7.1 oz) Last BM Date: 01/28/16  Intake/Output from previous day: 05/31 0701 - 06/01 0700 In: 3849.6 [P.O.:1400; I.V.:2449.6] Out: 1750 [Urine:1750]  PHYSICAL EXAM General appearance: alert, cooperative and mild distress Resp: clear to auscultation bilaterally Cardio: irregularly irregular rhythm GI: soft, non-tender; bowel sounds normal; no masses,  no organomegaly Extremities: extremities normal, atraumatic, no cyanosis or edema  Lab Results:  Results for orders placed or performed during the hospital encounter of 01/25/16 (from the past 48 hour(s))  Glucose, capillary     Status: None   Collection Time: 01/27/16  7:39 AM  Result Value Ref Range   Glucose-Capillary 69 65 - 99 mg/dL  CBC with Differential/Platelet     Status: Abnormal   Collection Time: 01/28/16  8:59 AM  Result Value Ref Range   WBC 22.2 (H) 4.0 - 10.5 K/uL   RBC 3.30 (L) 4.22 - 5.81 MIL/uL   Hemoglobin 9.1 (L) 13.0 - 17.0 g/dL   HCT 28.9 (L) 39.0 - 52.0 %   MCV 87.6 78.0 - 100.0 fL   MCH 27.6 26.0 - 34.0 pg   MCHC 31.5 30.0 - 36.0 g/dL   RDW 16.8 (H) 11.5 - 15.5 %   Platelets 305 150 - 400 K/uL   Neutrophils Relative % 87 %   Neutro Abs 19.5 (H) 1.7 - 7.7 K/uL   Lymphocytes Relative 6 %   Lymphs Abs 1.3 0.7 - 4.0 K/uL   Monocytes Relative 7 %   Monocytes Absolute 1.5 (H) 0.1 - 1.0 K/uL   Eosinophils Relative 0  %   Eosinophils Absolute 0.0 0.0 - 0.7 K/uL   Basophils Relative 0 %   Basophils Absolute 0.0 0.0 - 0.1 K/uL  Basic metabolic panel     Status: Abnormal   Collection Time: 01/28/16  8:59 AM  Result Value Ref Range   Sodium 139 135 - 145 mmol/L   Potassium 3.0 (L) 3.5 - 5.1 mmol/L   Chloride 111 101 - 111 mmol/L   CO2 24 22 - 32 mmol/L   Glucose, Bld 88 65 - 99 mg/dL   BUN 14 6 - 20 mg/dL   Creatinine, Ser 0.46 (L) 0.61 - 1.24 mg/dL   Calcium 7.5 (L) 8.9 - 10.3 mg/dL   GFR calc non Af Amer >60 >60 mL/min   GFR calc Af Amer >60 >60 mL/min    Comment: (NOTE) The eGFR has been calculated using the CKD EPI equation. This calculation has not been validated in all clinical situations. eGFR's persistently <60 mL/min signify possible Chronic Kidney Disease.    Anion gap 4 (L) 5 - 15  CBC with Differential/Platelet     Status: Abnormal   Collection Time: 01/29/16  5:09 AM  Result Value Ref Range   WBC 16.1 (H) 4.0 - 10.5 K/uL   RBC 3.05 (L) 4.22 - 5.81 MIL/uL  Hemoglobin 8.2 (L) 13.0 - 17.0 g/dL   HCT 26.6 (L) 39.0 - 52.0 %   MCV 87.2 78.0 - 100.0 fL   MCH 26.9 26.0 - 34.0 pg   MCHC 30.8 30.0 - 36.0 g/dL   RDW 16.9 (H) 11.5 - 15.5 %   Platelets 265 150 - 400 K/uL   Neutrophils Relative % 84 %   Neutro Abs 13.4 (H) 1.7 - 7.7 K/uL   Lymphocytes Relative 7 %   Lymphs Abs 1.1 0.7 - 4.0 K/uL   Monocytes Relative 9 %   Monocytes Absolute 1.5 (H) 0.1 - 1.0 K/uL   Eosinophils Relative 0 %   Eosinophils Absolute 0.0 0.0 - 0.7 K/uL   Basophils Relative 0 %   Basophils Absolute 0.0 0.0 - 0.1 K/uL  Basic metabolic panel     Status: Abnormal   Collection Time: 01/29/16  5:09 AM  Result Value Ref Range   Sodium 139 135 - 145 mmol/L   Potassium 3.0 (L) 3.5 - 5.1 mmol/L   Chloride 111 101 - 111 mmol/L   CO2 25 22 - 32 mmol/L   Glucose, Bld 101 (H) 65 - 99 mg/dL   BUN 8 6 - 20 mg/dL   Creatinine, Ser 0.42 (L) 0.61 - 1.24 mg/dL   Calcium 7.2 (L) 8.9 - 10.3 mg/dL   GFR calc non Af Amer  >60 >60 mL/min   GFR calc Af Amer >60 >60 mL/min    Comment: (NOTE) The eGFR has been calculated using the CKD EPI equation. This calculation has not been validated in all clinical situations. eGFR's persistently <60 mL/min signify possible Chronic Kidney Disease.    Anion gap 3 (L) 5 - 15    ABGS No results for input(s): PHART, PO2ART, TCO2, HCO3 in the last 72 hours.  Invalid input(s): PCO2 CULTURES Recent Results (from the past 240 hour(s))  MRSA PCR Screening     Status: None   Collection Time: 01/25/16  1:40 PM  Result Value Ref Range Status   MRSA by PCR NEGATIVE NEGATIVE Final    Comment:        The GeneXpert MRSA Assay (FDA approved for NASAL specimens only), is one component of a comprehensive MRSA colonization surveillance program. It is not intended to diagnose MRSA infection nor to guide or monitor treatment for MRSA infections.   C difficile quick scan w PCR reflex     Status: Abnormal   Collection Time: 01/25/16  2:00 PM  Result Value Ref Range Status   C Diff antigen POSITIVE (A) NEGATIVE Final   C Diff toxin NEGATIVE NEGATIVE Final   C Diff interpretation   Final    C. difficile present, but toxin not detected. This indicates colonization. In most cases, this does not require treatment. If patient has signs and symptoms consistent with colitis, consider treatment. Requires ENTERIC precautions.   Studies/Results: No results found.  Medications:  Prior to Admission:  Prescriptions prior to admission  Medication Sig Dispense Refill Last Dose  . acetaminophen (TYLENOL) 500 MG tablet Take 500 mg by mouth every 6 (six) hours as needed for mild pain or moderate pain.   Past Week at Unknown time  . aspirin EC 81 MG tablet Take 81 mg by mouth daily.   01/25/2016 at Unknown time  . digoxin (LANOXIN) 0.125 MG tablet Take 1 tablet (0.125 mg total) by mouth daily. 90 tablet 2 01/25/2016 at Unknown time  . Multiple Vitamins-Minerals (MULTIVITAMINS THER. W/MINERALS)  TABS tablet Take 1 tablet  by mouth daily.   01/25/2016 at Unknown time  . OXYGEN Inhale 2 L into the lungs at bedtime.   01/24/2016 at Unknown time  . propranolol (INDERAL) 20 MG tablet Take 1.5 tablets (30 mg total) by mouth 3 (three) times daily. 30 tablet 0 01/25/2016 at 0800  . rivaroxaban (XARELTO) 20 MG TABS tablet Take 1 tablet (20 mg total) by mouth daily with supper. 90 tablet 0 01/24/2016 at Unknown time  . simvastatin (ZOCOR) 40 MG tablet Take 1 tablet by mouth daily.   01/25/2016 at Unknown time  . tamsulosin (FLOMAX) 0.4 MG CAPS capsule Take 1 capsule (0.4 mg total) by mouth daily. 90 capsule 2 01/25/2016 at Unknown time  . nicotine (NICODERM CQ - DOSED IN MG/24 HOURS) 14 mg/24hr patch Place 1 patch (14 mg total) onto the skin daily. (Patient not taking: Reported on 01/25/2016) 28 patch 0 Not Taking at Unknown time   Scheduled: . antiseptic oral rinse  7 mL Mouth Rinse BID  . nicotine  21 mg Transdermal Daily  . sodium chloride flush  3 mL Intravenous Q12H  . vancomycin  125 mg Oral Q6H   Continuous: . sodium chloride 100 mL/hr at 01/29/16 0000  . amiodarone 30 mg/hr (01/28/16 2116)   TJL:[id number] **OR** acetaminophen, morphine injection, ondansetron **OR** ondansetron (ZOFRAN) IV  Assesment: He was admitted with GI bleeding and acute blood loss anemia. He is doing better. His hemoglobin level is pretty stable although he has drifted down a bit from yesterday. At baseline he has atrial fib and has had RVR and he's now on amiodarone with pretty good control of his heart rate. He was anticoagulated and that has been held in the face of GI bleeding. It is felt that his most likely cause of the bleeding is ischemic disease of the gut with low flow state. Discussed today with Dr. Gala Romney regarding timing of angiogram. He suggested at this point we go ahead with CT angiogram knowing that we can make intervention at that time but his situation is still somewhat confusing and that should give [place]  information that we can provide to interventional radiologist. Principal Problem:   Acute blood loss anemia Active Problems:   Atrial fibrillation with RVR (HCC)   COPD (chronic obstructive pulmonary disease) ([place])   Hypertension   High cholesterol   Hypokalemia   Rectal bleeding   Protein-calorie malnutrition, severe   Diarrhea   Ischemic disease of gut ([place])    Plan: CT angiogram of the abdomen today    LOS: 4 days   Trevar Boehringer L 01/29/2016, 7:38 AM

## 2016-01-30 ENCOUNTER — Ambulatory Visit: Payer: Commercial Managed Care - HMO | Admitting: Urology

## 2016-01-30 DIAGNOSIS — K559 Vascular disorder of intestine, unspecified: Secondary | ICD-10-CM

## 2016-01-30 LAB — CBC WITH DIFFERENTIAL/PLATELET
BASOS ABS: 0 10*3/uL (ref 0.0–0.1)
Basophils Relative: 0 %
Eosinophils Absolute: 0.1 10*3/uL (ref 0.0–0.7)
Eosinophils Relative: 1 %
HEMATOCRIT: 26.2 % — AB (ref 39.0–52.0)
HEMOGLOBIN: 8.2 g/dL — AB (ref 13.0–17.0)
LYMPHS PCT: 9 %
Lymphs Abs: 1.1 10*3/uL (ref 0.7–4.0)
MCH: 27.1 pg (ref 26.0–34.0)
MCHC: 31.3 g/dL (ref 30.0–36.0)
MCV: 86.5 fL (ref 78.0–100.0)
MONO ABS: 1.4 10*3/uL — AB (ref 0.1–1.0)
Monocytes Relative: 11 %
NEUTROS ABS: 10 10*3/uL — AB (ref 1.7–7.7)
Neutrophils Relative %: 79 %
Platelets: 233 10*3/uL (ref 150–400)
RBC: 3.03 MIL/uL — AB (ref 4.22–5.81)
RDW: 16.5 % — AB (ref 11.5–15.5)
WBC: 12.6 10*3/uL — ABNORMAL HIGH (ref 4.0–10.5)

## 2016-01-30 LAB — BASIC METABOLIC PANEL
ANION GAP: 4 — AB (ref 5–15)
BUN: <5 mg/dL — ABNORMAL LOW (ref 6–20)
CHLORIDE: 108 mmol/L (ref 101–111)
CO2: 27 mmol/L (ref 22–32)
Calcium: 7.3 mg/dL — ABNORMAL LOW (ref 8.9–10.3)
Creatinine, Ser: 0.38 mg/dL — ABNORMAL LOW (ref 0.61–1.24)
GFR calc Af Amer: >60 mL/min (ref >60)
GFR calc non Af Amer: >60 mL/min (ref >60)
GLUCOSE: 90 mg/dL (ref 65–99)
POTASSIUM: 2.8 mmol/L — AB (ref 3.5–5.1)
Sodium: 139 mmol/L (ref 135–145)

## 2016-01-30 LAB — GLUCOSE, CAPILLARY: Glucose-Capillary: 100 mg/dL — ABNORMAL HIGH (ref 65–99)

## 2016-01-30 MED ORDER — POTASSIUM CHLORIDE 10 MEQ/100ML IV SOLN
10.0000 meq | INTRAVENOUS | Status: AC
  Administered 2016-01-30 (×4): 10 meq via INTRAVENOUS
  Filled 2016-01-30 (×4): qty 100

## 2016-01-30 MED ORDER — AMIODARONE HCL 200 MG PO TABS
400.0000 mg | ORAL_TABLET | Freq: Two times a day (BID) | ORAL | Status: DC
  Administered 2016-01-30 – 2016-02-03 (×10): 400 mg via ORAL
  Filled 2016-01-30 (×10): qty 2

## 2016-01-30 NOTE — Progress Notes (Signed)
Patient ID: Jade Landsberg, male   DOB: 1941/03/04, 75 y.o.   MRN: [id number]      Subjective:    No complaints  Objective:   Temp:  [97.6 F (36.4 C)-99 F (37.2 C)] 98.3 F (36.8 C) (06/02 0800) Pulse Rate:  [58-130] 70 (06/02 0300) Resp:  [19-25] 20 (06/02 0600) BP: (119-135)/(64-80) 133/68 mmHg (06/02 0600) SpO2:  [95 %-98 %] 97 % (06/02 0300) Weight:  [124 lb 12.5 oz (56.6 kg)] 124 lb 12.5 oz (56.6 kg) (06/02 0500) Last BM Date: 01/29/16  Filed Weights   01/28/16 0500 01/29/16 0500 01/30/16 0500  Weight: 117 lb 8.1 oz (53.3 kg) 117 lb 1 oz (53.1 kg) 124 lb 12.5 oz (56.6 kg)    Intake/Output Summary (Last 24 hours) at 01/30/16 0848 Last data filed at 01/30/16 0600  Gross per 24 hour  Intake 3156.03 ml  Output   2700 ml  Net 456.03 ml    Telemetry:afib rates low 100s  Exam:  General: NAD  HEENT: sclera clear, throat clear  Resp: CTAB  Cardiac: irreg, 3/6 systolic murmur apex, no jvd  GI: abdomen soft, NT, ND  MSK: no LE edema  Neuro: no focal deficits  Psych: appropriate affect  Lab Results:  Basic Metabolic Panel:  Recent Labs Lab 01/28/16 0859 01/29/16 0509 01/30/16 0431  NA 139 139 139  K 3.0* 3.0* 2.8*  CL 111 111 108  CO2 24 25 27   GLUCOSE 88 101* 90  BUN 14 8 <5*  CREATININE 0.46* 0.42* 0.38*  CALCIUM 7.5* 7.2* 7.3*    Liver Function Tests:  Recent Labs Lab 01/25/16 0850 01/26/16 0443 01/27/16  AST 29 21 24   ALT 23 17 18   ALKPHOS 109 97 82  BILITOT 0.4 0.6 0.8  PROT 5.5* 4.8* 4.6*  ALBUMIN 2.5* 2.3* 2.1*    CBC:  Recent Labs Lab 01/28/16 0859 01/29/16 0509 01/30/16 0431  WBC 22.2* 16.1* 12.6*  HGB 9.1* 8.2* 8.2*  HCT 28.9* 26.6* 26.2*  MCV 87.6 87.2 86.5  PLT 305 265 233    Cardiac Enzymes: No results for input(s): CKTOTAL, CKMB, CKMBINDEX, TROPONINI in the last 168 hours.  BNP: No results for input(s): PROBNP in the last 8760 hours.  Coagulation:  Recent Labs Lab 01/25/16 1215  INR 2.40*     ECG:   Medications:   Scheduled Medications: . antiseptic oral rinse  7 mL Mouth Rinse BID  . magnesium oxide  200 mg Oral BID  . nicotine  21 mg Transdermal Daily  . potassium chloride  10 mEq Intravenous Q1 Hr x 4  . potassium chloride  20 mEq Oral Daily  . sodium chloride flush  3 mL Intravenous Q12H  . vancomycin  125 mg Oral Q6H     Infusions: . sodium chloride 100 mL/hr at 01/30/16 0546  . amiodarone 30 mg/hr (01/29/16 2300)     PRN Medications:  acetaminophen **OR** acetaminophen, morphine injection, ondansetron **OR** ondansetron (ZOFRAN) IV     Assessment/Plan   1. Afib with RVR - had issues with low bp's with rate control strategy. Due to ischemic bowel looking to avoid hypotension, we changed him to amio drip. Kept on yesterday at maintence rate, suspect probable some decreased oral absorption given his GI issues and per primary team he is to remain NPO.  - Off anticoag in setting of GI bleed - we will change to amio 400mg  bid. Plan would be for 400mg  bid x 7 days followed by 200mg  bid x 2 weeks  then 200mg  daily.      Carlyle Dolly, M.D.

## 2016-01-30 NOTE — Progress Notes (Signed)
Subjective: He says he feels better. He has no new complaints. He's been able to tolerate full liquids. No more bleeding. He remains in atrial fib on amiodarone. His CT results are noted and discussed with his family and the patient.  Objective: Vital signs in last 24 hours: Temp:  [97.5 F (36.4 C)-99 F (37.2 C)] 97.6 F (36.4 C) (06/02 0400) Pulse Rate:  [58-130] 70 (06/02 0300) Resp:  [19-25] 20 (06/02 0600) BP: (114-135)/(63-80) 133/68 mmHg (06/02 0600) SpO2:  [95 %-98 %] 97 % (06/02 0300) Weight:  [56.6 kg (124 lb 12.5 oz)] 56.6 kg (124 lb 12.5 oz) (06/02 0500) Weight change: 3.5 kg (7 lb 11.5 oz) Last BM Date: 01/29/16  Intake/Output from previous day: 06/01 0701 - 06/02 0700 In: 3919.4 [P.O.:720; I.V.:3199.4] Out: 2700 [Urine:2700]  PHYSICAL EXAM General appearance: alert, cooperative and mild distress Resp: clear to auscultation bilaterally Cardio: irregularly irregular rhythm GI: soft, non-tender; bowel sounds normal; no masses,  no organomegaly Extremities: extremities normal, atraumatic, no cyanosis or edema  Lab Results:  Results for orders placed or performed during the hospital encounter of 01/25/16 (from the past 48 hour(s))  CBC with Differential/Platelet     Status: Abnormal   Collection Time: 01/28/16  8:59 AM  Result Value Ref Range   WBC 22.2 (H) 4.0 - 10.5 K/uL   RBC 3.30 (L) 4.22 - 5.81 MIL/uL   Hemoglobin 9.1 (L) 13.0 - 17.0 g/dL   HCT 28.9 (L) 39.0 - 52.0 %   MCV 87.6 78.0 - 100.0 fL   MCH 27.6 26.0 - 34.0 pg   MCHC 31.5 30.0 - 36.0 g/dL   RDW 16.8 (H) 11.5 - 15.5 %   Platelets 305 150 - 400 K/uL   Neutrophils Relative % 87 %   Neutro Abs 19.5 (H) 1.7 - 7.7 K/uL   Lymphocytes Relative 6 %   Lymphs Abs 1.3 0.7 - 4.0 K/uL   Monocytes Relative 7 %   Monocytes Absolute 1.5 (H) 0.1 - 1.0 K/uL   Eosinophils Relative 0 %   Eosinophils Absolute 0.0 0.0 - 0.7 K/uL   Basophils Relative 0 %   Basophils Absolute 0.0 0.0 - 0.1 K/uL  Basic metabolic  panel     Status: Abnormal   Collection Time: 01/28/16  8:59 AM  Result Value Ref Range   Sodium 139 135 - 145 mmol/L   Potassium 3.0 (L) 3.5 - 5.1 mmol/L   Chloride 111 101 - 111 mmol/L   CO2 24 22 - 32 mmol/L   Glucose, Bld 88 65 - 99 mg/dL   BUN 14 6 - 20 mg/dL   Creatinine, Ser 0.46 (L) 0.61 - 1.24 mg/dL   Calcium 7.5 (L) 8.9 - 10.3 mg/dL   GFR calc non Af Amer >60 >60 mL/min   GFR calc Af Amer >60 >60 mL/min    Comment: (NOTE) The eGFR has been calculated using the CKD EPI equation. This calculation has not been validated in all clinical situations. eGFR's persistently <60 mL/min signify possible Chronic Kidney Disease.    Anion gap 4 (L) 5 - 15  CBC with Differential/Platelet     Status: Abnormal   Collection Time: 01/29/16  5:09 AM  Result Value Ref Range   WBC 16.1 (H) 4.0 - 10.5 K/uL   RBC 3.05 (L) 4.22 - 5.81 MIL/uL   Hemoglobin 8.2 (L) 13.0 - 17.0 g/dL   HCT 26.6 (L) 39.0 - 52.0 %   MCV 87.2 78.0 - 100.0 fL   MCH  26.9 26.0 - 34.0 pg   MCHC 30.8 30.0 - 36.0 g/dL   RDW 16.9 (H) 11.5 - 15.5 %   Platelets 265 150 - 400 K/uL   Neutrophils Relative % 84 %   Neutro Abs 13.4 (H) 1.7 - 7.7 K/uL   Lymphocytes Relative 7 %   Lymphs Abs 1.1 0.7 - 4.0 K/uL   Monocytes Relative 9 %   Monocytes Absolute 1.5 (H) 0.1 - 1.0 K/uL   Eosinophils Relative 0 %   Eosinophils Absolute 0.0 0.0 - 0.7 K/uL   Basophils Relative 0 %   Basophils Absolute 0.0 0.0 - 0.1 K/uL  Basic metabolic panel     Status: Abnormal   Collection Time: 01/29/16  5:09 AM  Result Value Ref Range   Sodium 139 135 - 145 mmol/L   Potassium 3.0 (L) 3.5 - 5.1 mmol/L   Chloride 111 101 - 111 mmol/L   CO2 25 22 - 32 mmol/L   Glucose, Bld 101 (H) 65 - 99 mg/dL   BUN 8 6 - 20 mg/dL   Creatinine, Ser 0.42 (L) 0.61 - 1.24 mg/dL   Calcium 7.2 (L) 8.9 - 10.3 mg/dL   GFR calc non Af Amer >60 >60 mL/min   GFR calc Af Amer >60 >60 mL/min    Comment: (NOTE) The eGFR has been calculated using the CKD EPI  equation. This calculation has not been validated in all clinical situations. eGFR's persistently <60 mL/min signify possible Chronic Kidney Disease.    Anion gap 3 (L) 5 - 15  CBC with Differential/Platelet     Status: Abnormal   Collection Time: 01/30/16  4:31 AM  Result Value Ref Range   WBC 12.6 (H) 4.0 - 10.5 K/uL   RBC 3.03 (L) 4.22 - 5.81 MIL/uL   Hemoglobin 8.2 (L) 13.0 - 17.0 g/dL   HCT 26.2 (L) 39.0 - 52.0 %   MCV 86.5 78.0 - 100.0 fL   MCH 27.1 26.0 - 34.0 pg   MCHC 31.3 30.0 - 36.0 g/dL   RDW 16.5 (H) 11.5 - 15.5 %   Platelets 233 150 - 400 K/uL   Neutrophils Relative % 79 %   Neutro Abs 10.0 (H) 1.7 - 7.7 K/uL   Lymphocytes Relative 9 %   Lymphs Abs 1.1 0.7 - 4.0 K/uL   Monocytes Relative 11 %   Monocytes Absolute 1.4 (H) 0.1 - 1.0 K/uL   Eosinophils Relative 1 %   Eosinophils Absolute 0.1 0.0 - 0.7 K/uL   Basophils Relative 0 %   Basophils Absolute 0.0 0.0 - 0.1 K/uL  Basic metabolic panel     Status: Abnormal   Collection Time: 01/30/16  4:31 AM  Result Value Ref Range   Sodium 139 135 - 145 mmol/L   Potassium 2.8 (L) 3.5 - 5.1 mmol/L   Chloride 108 101 - 111 mmol/L   CO2 27 22 - 32 mmol/L   Glucose, Bld 90 65 - 99 mg/dL   BUN <5 (L) 6 - 20 mg/dL   Creatinine, Ser 0.38 (L) 0.61 - 1.24 mg/dL   Calcium 7.3 (L) 8.9 - 10.3 mg/dL   GFR calc non Af Amer >60 >60 mL/min   GFR calc Af Amer >60 >60 mL/min    Comment: (NOTE) The eGFR has been calculated using the CKD EPI equation. This calculation has not been validated in all clinical situations. eGFR's persistently <60 mL/min signify possible Chronic Kidney Disease.    Anion gap 4 (L) 5 - 15  ABGS No results for input(s): PHART, PO2ART, TCO2, HCO3 in the last 72 hours.  Invalid input(s): PCO2 CULTURES Recent Results (from the past 240 hour(s))  MRSA PCR Screening     Status: None   Collection Time: 01/25/16  1:40 PM  Result Value Ref Range Status   MRSA by PCR NEGATIVE NEGATIVE Final    Comment:         The GeneXpert MRSA Assay (FDA approved for NASAL specimens only), is one component of a comprehensive MRSA colonization surveillance program. It is not intended to diagnose MRSA infection nor to guide or monitor treatment for MRSA infections.   C difficile quick scan w PCR reflex     Status: Abnormal   Collection Time: 01/25/16  2:00 PM  Result Value Ref Range Status   C Diff antigen POSITIVE (A) NEGATIVE Final   C Diff toxin NEGATIVE NEGATIVE Final   C Diff interpretation   Final    C. difficile present, but toxin not detected. This indicates colonization. In most cases, this does not require treatment. If patient has signs and symptoms consistent with colitis, consider treatment. Requires ENTERIC precautions.   Studies/Results: Ct Angio Abd/pel W/ And/or W/o  01/29/2016  CLINICAL DATA:  75 year old male with generalized abdominal pain and recent GI bleed. Evaluate for mesenteric ischemia. EXAM: CTA ABDOMEN AND PELVIS wITHOUT AND WITH CONTRAST TECHNIQUE: Multidetector CT imaging of the abdomen and pelvis was performed using the standard protocol during bolus administration of intravenous contrast. Multiplanar reconstructed images and MIPs were obtained and reviewed to evaluate the vascular anatomy. CONTRAST:  100 mL Isovue 370 COMPARISON:  Prior CTA abdomen and pelvis 01/25/2016 FINDINGS: VASCULAR Aorta: Surgical changes of prior tube graft repair of reported abdominal aortic aneurysm. There is focal narrowing of the aortic lumen in the region of the proximal anastomosis. The aortic lumen measures only 12 mm at this location in there is extensive peripheral calcification. No evidence of aneurysm or pseudoaneurysm. Celiac: Heavily diseased origin the celiac artery secondary to bulky calcified atherosclerotic plaque. There is either high-grade stenosis or focal occlusion of the origin of the celiac artery. The splenic artery is relatively small in caliber. Conventional hepatic arterial anatomy.  SMA: Approximately 1.5 cm occlusion of the origin of the superior mesenteric artery. There is bulky calcified plaque surrounding the origin. Renals: Solitary bilateral renal arteries. Predominately calcified plaque results in at least moderate stenosis of the left renal artery origin. Heterogeneous fibro fatty and calcified plaque results in mild narrowing of the right renal artery origin. No renal artery aneurysm or fibromuscular dysplasia. IMA: The origin has been ligated. The distal branches opacified via retrograde flow. Inflow: Relatively high-grade focal stenoses of the most proximal bilateral iliac arteries secondary to bulky calcified plaque. Mild and possibly poststenotic aneurysmal dilatation of the iliac arteries measuring up to 1.9 cm on the right and 1.6 cm on the left. The bilateral internal and external iliac arteries remain patent. Proximal Outflow: Bulky calcified atherosclerotic plaque along the posterior wall of the common femoral arteries bilaterally. The visualized proximal profunda and superficial femoral arteries remain patent. Veins: No focal venous abnormality. NON-VASCULAR Lower Chest: Small bilateral layering pleural effusions. There is associated lower lobe atelectasis mild lower lobe bronchial wall thickening. Cardiomegaly. Atherosclerotic calcifications noted along the visualized coronary arteries. No pericardial effusion. Unremarkable distal thoracic esophagus. Abdomen: Unremarkable CT appearance of the pancreas, spleen, and adrenal glands for age. Grossly unremarkable stomach and duodenum. No evidence of obstruction or focal bowel wall thickening. No focal pneumatosis  or free air. Normal hepatic contour and morphology. No discrete hepatic lesions. The gallbladder is surgically absent. No intra or extrahepatic biliary ductal dilatation. Mild periportal edema. No evidence of portal venous gas. Diffuse mild mesenteric edema.  Trace free fluid. Pelvis: Foley catheter is noted within the  bladder. The bladder wall appears somewhat thickened although evaluation is limited by underdistention. Bones/Soft Tissues: Mild anasarca. No acute fracture or aggressive appearing lytic or blastic osseous lesion. Degenerative dextro convex scoliosis centered at L1-L2. Review of the MIP images confirms the above findings. IMPRESSION: VASCULAR 1. Severe and largely heavily calcified vascular disease with high-grade stenosis versus occlusion of the celiac artery, occlusion of the origin of the superior mesenteric artery and surgical ligation of the inferior mesenteric artery. These findings are highly consistent with a chronic mesenteric ischemia affecting the entirety of the gastrointestinal tract. Nearly all flow to the gastrointestinal tract may be via collateral pathways. 2. Surgical changes of prior infrarenal abdominal aortic repair with aortic tube graft extending from the infrarenal aorta to just proximal to the aortic bifurcation. 3. Focal moderate calcified stenosis of the infrarenal abdominal aorta in the region of the proximal tube graft anastomosis. 4. Focal high-grade calcified stenoses of the bilateral common iliac artery origins followed by poststenotic dilatation as described above. 5. Bulky calcified atherosclerotic plaque in both common femoral arteries resulting in mild to moderate stenosis. 6. Primarily fibro fatty plaque results in a mild narrowing of the right renal artery origin. 7. Calcified plaque results in moderate narrowing of the left renal artery origin. NON VASCULAR 1. No evidence of pneumatosis, portal venous air, or free air. 2. No evidence of obstruction or focal bowel wall thickening. 3. Bilateral pleural effusions, periportal edema, anasarca, small volume ascites and mesenteric edema all consistent with volume overload versus CHF or third-spacing. 4. Bilateral lower lobe atelectasis and bronchial wall thickening. 5. Additional ancillary findings as above without significant  interval change. Signed, Criselda Peaches, MD [facility] Radiology Electronically Signed   By: Jacqulynn Cadet M.D.   On: 01/29/2016 11:35    Medications:  Prior to Admission:  Prescriptions prior to admission  Medication Sig Dispense Refill Last Dose  . acetaminophen (TYLENOL) 500 MG tablet Take 500 mg by mouth every 6 (six) hours as needed for mild pain or moderate pain.   Past Week at Unknown time  . aspirin EC 81 MG tablet Take 81 mg by mouth daily.   01/25/2016 at Unknown time  . digoxin (LANOXIN) 0.125 MG tablet Take 1 tablet (0.125 mg total) by mouth daily. 90 tablet 2 01/25/2016 at Unknown time  . Multiple Vitamins-Minerals (MULTIVITAMINS THER. W/MINERALS) TABS tablet Take 1 tablet by mouth daily.   01/25/2016 at Unknown time  . OXYGEN Inhale 2 L into the lungs at bedtime.   01/24/2016 at Unknown time  . propranolol (INDERAL) 20 MG tablet Take 1.5 tablets (30 mg total) by mouth 3 (three) times daily. 30 tablet 0 01/25/2016 at 0800  . rivaroxaban (XARELTO) 20 MG TABS tablet Take 1 tablet (20 mg total) by mouth daily with supper. 90 tablet 0 01/24/2016 at Unknown time  . simvastatin (ZOCOR) 40 MG tablet Take 1 tablet by mouth daily.   01/25/2016 at Unknown time  . tamsulosin (FLOMAX) 0.4 MG CAPS capsule Take 1 capsule (0.4 mg total) by mouth daily. 90 capsule 2 01/25/2016 at Unknown time  . nicotine (NICODERM CQ - DOSED IN MG/24 HOURS) 14 mg/24hr patch Place 1 patch (14 mg  total) onto the skin daily. (Patient not taking: Reported on 01/25/2016) 28 patch 0 Not Taking at Unknown time   Scheduled: . antiseptic oral rinse  7 mL Mouth Rinse BID  . magnesium oxide  200 mg Oral BID  . nicotine  21 mg Transdermal Daily  . potassium chloride  10 mEq Intravenous Q1 Hr x 4  . potassium chloride  20 mEq Oral Daily  . sodium chloride flush  3 mL Intravenous Q12H  . vancomycin  125 mg Oral Q6H   Continuous: . sodium chloride 100 mL/hr at 01/30/16 0546   . amiodarone 30 mg/hr (01/29/16 2300)   CZY:[id number] **OR** acetaminophen, morphine injection, ondansetron **OR** ondansetron (ZOFRAN) IV  Assesment: He was admitted with GI bleeding and acute blood loss anemia. This is felt to be related to ischemia. His EGD and colonoscopy showed widespread ischemic looking changes without frank infarction. CT angiogram shows severe vascular disease to his mesenteric area.  He has been having symptoms for several months with weight loss and abdominal discomfort after eating.  He has recently been diagnosed with atrial fib with RVR and he was placed on anticoagulation for that which exacerbated his bleeding. Because of concerns about hypotension he has transitioned off beta blockers and calcium channel blockers and is now on amiodarone.  He is hypokalemic and this will be replaced.  He has had significant problems with diarrhea but that has stopped. Principal Problem:   Acute blood loss anemia Active Problems:   Atrial fibrillation with RVR (HCC)   COPD (chronic obstructive pulmonary disease) (HCC)   Hypertension   High cholesterol   Hypokalemia   Rectal bleeding   Protein-calorie malnutrition, severe   Diarrhea   Ischemic disease of gut ([place])    Plan: Continue treatment. Replace his potassium both IV nothing by mouth. I have requested interventional radiology evaluation. Will see about timing of potential interventional angiography. Continue with his other treatments.    LOS: 5 days   Buryl Bamber L 01/30/2016, 7:43 AM

## 2016-01-30 NOTE — Care Management Important Message (Signed)
Important Message  Patient Details  Name: Steve Gomez MRN: [id number] Date of Birth: June 12, 1941   Medicare Important Message Given:  Yes    Sherald Barge, RN 01/30/2016, 11:30 AM

## 2016-01-30 NOTE — Progress Notes (Signed)
Patient ID: Steve Gomez, male   DOB: 04/16/1941, 75 y.o.   MRN: [id number]  Request made for mesenteric arteriogram with possible intervention per Dr Velvet Bathe And pain; Mesenteric ischemia  IMPRESSION: VASCULAR  1. Severe and largely heavily calcified vascular disease with high-grade stenosis versus occlusion of the celiac artery, occlusion of the origin of the superior mesenteric artery and surgical ligation of the inferior mesenteric artery. These findings are highly consistent with a chronic mesenteric ischemia affecting the entirety of the gastrointestinal tract. Nearly all flow to the gastrointestinal tract may be via collateral pathways. 2. Surgical changes of prior infrarenal abdominal aortic repair with aortic tube graft extending from the infrarenal aorta to just proximal to the aortic bifurcation. 3. Focal moderate calcified stenosis of the infrarenal abdominal aorta in the region of the proximal tube graft anastomosis. 4. Focal high-grade calcified stenoses of the bilateral common iliac artery origins followed by poststenotic dilatation as described above. 5. Bulky calcified atherosclerotic plaque in both common femoral arteries resulting in mild to moderate stenosis. 6. Primarily fibro fatty plaque results in a mild narrowing of the right renal artery origin. 7. Calcified plaque results in moderate narrowing of the left renal artery origin.  Imaging has been reviewed and approved by Dr Earleen Newport Scheduled for procedure 6/5 in [facility] RN aware Orders in chart

## 2016-01-30 NOTE — Progress Notes (Signed)
Subjective: Feels good today. Denies abdominal pain, N/V, chest pain, dyspnea. Not sleeping well because he's not at home. Misses his dog. Is aware of his CT results and he and his family are anxious to have IR stent placement to "get me better." No other upper or lower GI symptoms at this time.  Discussed the need to notify staff ASAP if any changes such as onset of abdominal pain, recurrent bleeding, confusion, etc. They verbalize understanding.  Objective: Vital signs in last 24 hours: Temp:  [97.6 F (36.4 C)-99 F (37.2 C)] 98.3 F (36.8 C) (06/02 0800) Pulse Rate:  [58-130] 70 (06/02 0300) Resp:  [19-25] 20 (06/02 0600) BP: (119-135)/(64-80) 133/68 mmHg (06/02 0600) SpO2:  [95 %-98 %] 97 % (06/02 0300) Weight:  [124 lb 12.5 oz (56.6 kg)] 124 lb 12.5 oz (56.6 kg) (06/02 0500) Last BM Date: 01/29/16 General:   Alert and oriented, pleasant Head:  Normocephalic and atraumatic. Heart:  S1, S2 present, systolic murmur noted.  Lungs: Clear to auscultation bilaterally, without wheezing, rales, or rhonchi.  Abdomen:  Bowel sounds present, soft, non-tender, non-distended. No rebound or guarding. Msk:  Symmetrical without gross deformities. Pulses:  Normal pulses noted. Extremities:  Without clubbing or edema. Neurologic:  Alert and  oriented x4;  grossly normal neurologically. Psych:  Alert and cooperative. Normal mood and affect.  Intake/Output from previous day: 06/01 0701 - 06/02 0700 In: 3919.4 [P.O.:720; I.V.:3199.4] Out: 2700 [Urine:2700] Intake/Output this shift:    Lab Results:  Recent Labs  01/28/16 0859 01/29/16 0509 01/30/16 0431  WBC 22.2* 16.1* 12.6*  HGB 9.1* 8.2* 8.2*  HCT 28.9* 26.6* 26.2*  PLT 305 265 233   BMET  Recent Labs  01/28/16 0859 01/29/16 0509 01/30/16 0431  NA 139 139 139  K 3.0* 3.0* 2.8*  CL 111 111 108  CO2 24 25 27   GLUCOSE 88 101* 90  BUN 14 8 <5*  CREATININE 0.46* 0.42* 0.38*  CALCIUM 7.5* 7.2* 7.3*   LFT No results  for input(s): PROT, ALBUMIN, AST, ALT, ALKPHOS, BILITOT, BILIDIR, IBILI in the last 72 hours. PT/INR No results for input(s): LABPROT, INR in the last 72 hours. Hepatitis Panel No results for input(s): HEPBSAG, HCVAB, HEPAIGM, HEPBIGM in the last 72 hours.   Studies/Results: Ct Angio Abd/pel W/ And/or W/o  01/29/2016  CLINICAL DATA:  75 year old male with generalized abdominal pain and recent GI bleed. Evaluate for mesenteric ischemia. EXAM: CTA ABDOMEN AND PELVIS wITHOUT AND WITH CONTRAST TECHNIQUE: Multidetector CT imaging of the abdomen and pelvis was performed using the standard protocol during bolus administration of intravenous contrast. Multiplanar reconstructed images and MIPs were obtained and reviewed to evaluate the vascular anatomy. CONTRAST:  100 mL Isovue 370 COMPARISON:  Prior CTA abdomen and pelvis 01/25/2016 FINDINGS: VASCULAR Aorta: Surgical changes of prior tube graft repair of reported abdominal aortic aneurysm. There is focal narrowing of the aortic lumen in the region of the proximal anastomosis. The aortic lumen measures only 12 mm at this location in there is extensive peripheral calcification. No evidence of aneurysm or pseudoaneurysm. Celiac: Heavily diseased origin the celiac artery secondary to bulky calcified atherosclerotic plaque. There is either high-grade stenosis or focal occlusion of the origin of the celiac artery. The splenic artery is relatively small in caliber. Conventional hepatic arterial anatomy. SMA: Approximately 1.5 cm occlusion of the origin of the superior mesenteric artery. There is bulky calcified plaque surrounding the origin. Renals: Solitary bilateral renal arteries. Predominately calcified plaque results in at least  moderate stenosis of the left renal artery origin. Heterogeneous fibro fatty and calcified plaque results in mild narrowing of the right renal artery origin. No renal artery aneurysm or fibromuscular dysplasia. IMA: The origin has been  ligated. The distal branches opacified via retrograde flow. Inflow: Relatively high-grade focal stenoses of the most proximal bilateral iliac arteries secondary to bulky calcified plaque. Mild and possibly poststenotic aneurysmal dilatation of the iliac arteries measuring up to 1.9 cm on the right and 1.6 cm on the left. The bilateral internal and external iliac arteries remain patent. Proximal Outflow: Bulky calcified atherosclerotic plaque along the posterior wall of the common femoral arteries bilaterally. The visualized proximal profunda and superficial femoral arteries remain patent. Veins: No focal venous abnormality. NON-VASCULAR Lower Chest: Small bilateral layering pleural effusions. There is associated lower lobe atelectasis mild lower lobe bronchial wall thickening. Cardiomegaly. Atherosclerotic calcifications noted along the visualized coronary arteries. No pericardial effusion. Unremarkable distal thoracic esophagus. Abdomen: Unremarkable CT appearance of the pancreas, spleen, and adrenal glands for age. Grossly unremarkable stomach and duodenum. No evidence of obstruction or focal bowel wall thickening. No focal pneumatosis or free air. Normal hepatic contour and morphology. No discrete hepatic lesions. The gallbladder is surgically absent. No intra or extrahepatic biliary ductal dilatation. Mild periportal edema. No evidence of portal venous gas. Diffuse mild mesenteric edema.  Trace free fluid. Pelvis: Foley catheter is noted within the bladder. The bladder wall appears somewhat thickened although evaluation is limited by underdistention. Bones/Soft Tissues: Mild anasarca. No acute fracture or aggressive appearing lytic or blastic osseous lesion. Degenerative dextro convex scoliosis centered at L1-L2. Review of the MIP images confirms the above findings. IMPRESSION: VASCULAR 1. Severe and largely heavily calcified vascular disease with high-grade stenosis versus occlusion of the celiac artery,  occlusion of the origin of the superior mesenteric artery and surgical ligation of the inferior mesenteric artery. These findings are highly consistent with a chronic mesenteric ischemia affecting the entirety of the gastrointestinal tract. Nearly all flow to the gastrointestinal tract may be via collateral pathways. 2. Surgical changes of prior infrarenal abdominal aortic repair with aortic tube graft extending from the infrarenal aorta to just proximal to the aortic bifurcation. 3. Focal moderate calcified stenosis of the infrarenal abdominal aorta in the region of the proximal tube graft anastomosis. 4. Focal high-grade calcified stenoses of the bilateral common iliac artery origins followed by poststenotic dilatation as described above. 5. Bulky calcified atherosclerotic plaque in both common femoral arteries resulting in mild to moderate stenosis. 6. Primarily fibro fatty plaque results in a mild narrowing of the right renal artery origin. 7. Calcified plaque results in moderate narrowing of the left renal artery origin. NON VASCULAR 1. No evidence of pneumatosis, portal venous air, or free air. 2. No evidence of obstruction or focal bowel wall thickening. 3. Bilateral pleural effusions, periportal edema, anasarca, small volume ascites and mesenteric edema all consistent with volume overload versus CHF or third-spacing. 4. Bilateral lower lobe atelectasis and bronchial wall thickening. 5. Additional ancillary findings as above without significant interval change. Signed, Criselda Peaches, MD [facility] Electronically Signed   By: Jacqulynn Cadet M.D.   On: 01/29/2016 11:35    Assessment: 75 year old gentleman presented with hemodynamically significant GI bleed in the setting of recently initiated anticoagulation therapy for new onset atrial fibrillation. Anticoagulation therapy reversed with Kcentra at admission. Patient has received 2 units  of packed blood cells. Area of pneumatosis in the small bowel seen on prior  CT, no evidence of aortoenteric fistula. He does have significant mesenteric atherosclerosis.  Ileocolonoscopy showed marked inflammatory changes with ulceration involving most of the more proximal colon with more extreme inflammatory changes extending up into the terminal ileum. Status post biopsies. Upper endoscopy showed markedly abnormal stomach and duodenum with geographic ulceration and erosions in the stomach, mucosa edematous with marked friability. Inflammatory changes extended down into the fourth portion of duodenum became more pronounced with ulceration distally.   Significant leukocytosis and recent pneumatosis noted on prior CT-now not evident on current CT. C. difficile antigen positive in the setting of diarrhea following hospitalization and antibiotic exposure. Vancomycin started (no colitis on CT).Leukocytosis could be due to C. difficile or urinary tract infection in the setting of the right obstructive uropathy. Leukocytosis continues to improve.  CT Angiogram performed yesterday demonstrating significant atherosclerotic disease with gut perfusion primarily from collateral circulation. IR consulted and feel he would benefit from angiogram and intervention but would prefer to be out of ICU, unless changes in presentation such as onset of abdominal pain.  Today he is feeling well. Overall feels asymptomatic. Reviewed CT results and tentative plan with patient and family, they agree and are anxious to proceed as soon as possible. Labs improvign today, improved leucocytosis, hgb stable at 8.2, kidney function normal/stable. Vitals stable with last BP 133/68, in AFib HR 70 - 100. AFebrile.   Plan: 1. Continue to maintain adequate blood pressure to maintain as good a quality of guy perfusion as possible 2. Notify immediately if significant changes (new abdominal pain, mental status changes, vital sign instability,  spike in fever, etc.) 3. Plan for transfer to IR at [facility] when stable, per IR paremeters/request. 4. Will need to transfer urgently if any decompensation 5. Montior for recurrent bleeding 6. Supportive measures    Walden Field, AGNP-C Adult & Gerontological Nurse Practitioner [facility]     LOS: 5 days    01/30/2016, 8:26 AM

## 2016-01-30 NOTE — Progress Notes (Signed)
Patient back oxygen stated he could not wear mask with his CPAP. Wife forgot nasal prongs or pillows

## 2016-01-30 NOTE — Care Management Note (Signed)
Case Management Note  Patient Details  Name: Steve Gomez MRN: [id number] Date of Birth: 02/07/1941   Expected Discharge Date:    02/04/2016              Expected Discharge Plan:  Home/Self Care  In-House Referral:  NA  Discharge planning Services  CM Consult  Post Acute Care Choice:  NA Choice offered to:  NA  DME Arranged:    DME Agency:     HH Arranged:    [place] Agency:     Status of Service:  In process, will continue to follow  Medicare Important Message Given:  Yes Date Medicare IM Given:    Medicare IM give by:    Date Additional Medicare IM Given:    Additional Medicare Important Message give by:     If discussed at [place] of Stay Meetings, dates discussed:    Additional Comments: Anticipate transfer to [facility] at some point over weekend.   Sherald Barge, RN 01/30/2016, 11:31 AM

## 2016-01-31 LAB — CBC WITH DIFFERENTIAL/PLATELET
Basophils Absolute: 0 10*3/uL (ref 0.0–0.1)
Basophils Relative: 0 %
EOS ABS: 0.1 10*3/uL (ref 0.0–0.7)
Eosinophils Relative: 1 %
HEMATOCRIT: 26.5 % — AB (ref 39.0–52.0)
HEMOGLOBIN: 8.3 g/dL — AB (ref 13.0–17.0)
LYMPHS ABS: 0.8 10*3/uL (ref 0.7–4.0)
LYMPHS PCT: 7 %
MCH: 27.2 pg (ref 26.0–34.0)
MCHC: 31.3 g/dL (ref 30.0–36.0)
MCV: 86.9 fL (ref 78.0–100.0)
Monocytes Absolute: 1.1 10*3/uL — ABNORMAL HIGH (ref 0.1–1.0)
Monocytes Relative: 10 %
NEUTROS ABS: 9.1 10*3/uL — AB (ref 1.7–7.7)
NEUTROS PCT: 82 %
Platelets: 228 10*3/uL (ref 150–400)
RBC: 3.05 MIL/uL — AB (ref 4.22–5.81)
RDW: 16.2 % — ABNORMAL HIGH (ref 11.5–15.5)
WBC: 11.1 10*3/uL — AB (ref 4.0–10.5)

## 2016-01-31 LAB — BASIC METABOLIC PANEL
Anion gap: 5 (ref 5–15)
CHLORIDE: 104 mmol/L (ref 101–111)
CO2: 31 mmol/L (ref 22–32)
Calcium: 7.2 mg/dL — ABNORMAL LOW (ref 8.9–10.3)
Creatinine, Ser: 0.42 mg/dL — ABNORMAL LOW (ref 0.61–1.24)
GFR calc Af Amer: >60 mL/min (ref >60)
GFR calc non Af Amer: >60 mL/min (ref >60)
Glucose, Bld: 85 mg/dL (ref 65–99)
POTASSIUM: 3 mmol/L — AB (ref 3.5–5.1)
SODIUM: 140 mmol/L (ref 135–145)

## 2016-01-31 LAB — MAGNESIUM: MAGNESIUM: 1.4 mg/dL — AB (ref 1.7–2.4)

## 2016-01-31 MED ORDER — POTASSIUM CHLORIDE CRYS ER 20 MEQ PO TBCR
20.0000 meq | EXTENDED_RELEASE_TABLET | Freq: Four times a day (QID) | ORAL | Status: DC
  Administered 2016-01-31 – 2016-02-01 (×8): 20 meq via ORAL
  Filled 2016-01-31 (×7): qty 1

## 2016-01-31 NOTE — Progress Notes (Signed)
Pt left the floor with nursing staff.  Pt in no distress.  Belongings, paper chart, and medications sent with patient.  Report called to Dept. 300 RN.   Vitals signs as follows:  Temp: 97.5 F (36.4 C) (06/03 1552) BP: 138/93 mmHg (06/03 1600) Respirations: 30

## 2016-01-31 NOTE — Progress Notes (Signed)
Subjective: He says he feels better. He wants to get up. He would like to eat more.  Objective: Vital signs in last 24 hours: Temp:  [97.7 F (36.5 C)-98.7 F (37.1 C)] 98.7 F (37.1 C) (06/03 0400) Resp:  [18-26] 22 (06/03 0600) BP: (122-138)/(65-88) 136/80 mmHg (06/03 0600) SpO2:  [97 %] 97 % (06/02 2000) Weight:  [53.5 kg (117 lb 15.1 oz)] 53.5 kg (117 lb 15.1 oz) (06/03 0400) Weight change: -3.1 kg (-6 lb 13.4 oz) Last BM Date: 01/30/16  Intake/Output from previous day: 06/02 0701 - 06/03 0700 In: 2400 [I.V.:2400] Out: 5600 [Urine:5600]  PHYSICAL EXAM General appearance: alert, cooperative and no distress Resp: clear to auscultation bilaterally Cardio: irregularly irregular rhythm GI: soft, non-tender; bowel sounds normal; no masses,  no organomegaly Extremities: extremities normal, atraumatic, no cyanosis or edema  Lab Results:  Results for orders placed or performed during the hospital encounter of 01/25/16 (from the past 48 hour(s))  CBC with Differential/Platelet     Status: Abnormal   Collection Time: 01/30/16  4:31 AM  Result Value Ref Range   WBC 12.6 (H) 4.0 - 10.5 K/uL   RBC 3.03 (L) 4.22 - 5.81 MIL/uL   Hemoglobin 8.2 (L) 13.0 - 17.0 g/dL   HCT 26.2 (L) 39.0 - 52.0 %   MCV 86.5 78.0 - 100.0 fL   MCH 27.1 26.0 - 34.0 pg   MCHC 31.3 30.0 - 36.0 g/dL   RDW 16.5 (H) 11.5 - 15.5 %   Platelets 233 150 - 400 K/uL   Neutrophils Relative % 79 %   Neutro Abs 10.0 (H) 1.7 - 7.7 K/uL   Lymphocytes Relative 9 %   Lymphs Abs 1.1 0.7 - 4.0 K/uL   Monocytes Relative 11 %   Monocytes Absolute 1.4 (H) 0.1 - 1.0 K/uL   Eosinophils Relative 1 %   Eosinophils Absolute 0.1 0.0 - 0.7 K/uL   Basophils Relative 0 %   Basophils Absolute 0.0 0.0 - 0.1 K/uL  Basic metabolic panel     Status: Abnormal   Collection Time: 01/30/16  4:31 AM  Result Value Ref Range   Sodium 139 135 - 145 mmol/L   Potassium 2.8 (L) 3.5 - 5.1 mmol/L   Chloride 108 101 - 111 mmol/L   CO2 27 22 -  32 mmol/L   Glucose, Bld 90 65 - 99 mg/dL   BUN <5 (L) 6 - 20 mg/dL   Creatinine, Ser 0.38 (L) 0.61 - 1.24 mg/dL   Calcium 7.3 (L) 8.9 - 10.3 mg/dL   GFR calc non Af Amer >60 >60 mL/min   GFR calc Af Amer >60 >60 mL/min    Comment: (NOTE) The eGFR has been calculated using the CKD EPI equation. This calculation has not been validated in all clinical situations. eGFR's persistently <60 mL/min signify possible Chronic Kidney Disease.    Anion gap 4 (L) 5 - 15  Glucose, capillary     Status: Abnormal   Collection Time: 01/30/16  3:37 PM  Result Value Ref Range   Glucose-Capillary 100 (H) 65 - 99 mg/dL   Comment 1 Notify RN    Comment 2 Document in Chart   CBC with Differential/Platelet     Status: Abnormal   Collection Time: 01/31/16  3:51 AM  Result Value Ref Range   WBC 11.1 (H) 4.0 - 10.5 K/uL   RBC 3.05 (L) 4.22 - 5.81 MIL/uL   Hemoglobin 8.3 (L) 13.0 - 17.0 g/dL   HCT 26.5 (L) 39.0 -  52.0 %   MCV 86.9 78.0 - 100.0 fL   MCH 27.2 26.0 - 34.0 pg   MCHC 31.3 30.0 - 36.0 g/dL   RDW 16.2 (H) 11.5 - 15.5 %   Platelets 228 150 - 400 K/uL   Neutrophils Relative % 82 %   Neutro Abs 9.1 (H) 1.7 - 7.7 K/uL   Lymphocytes Relative 7 %   Lymphs Abs 0.8 0.7 - 4.0 K/uL   Monocytes Relative 10 %   Monocytes Absolute 1.1 (H) 0.1 - 1.0 K/uL   Eosinophils Relative 1 %   Eosinophils Absolute 0.1 0.0 - 0.7 K/uL   Basophils Relative 0 %   Basophils Absolute 0.0 0.0 - 0.1 K/uL  Basic metabolic panel     Status: Abnormal   Collection Time: 01/31/16  3:51 AM  Result Value Ref Range   Sodium 140 135 - 145 mmol/L   Potassium 3.0 (L) 3.5 - 5.1 mmol/L   Chloride 104 101 - 111 mmol/L   CO2 31 22 - 32 mmol/L   Glucose, Bld 85 65 - 99 mg/dL   BUN <5 (L) 6 - 20 mg/dL   Creatinine, Ser 0.42 (L) 0.61 - 1.24 mg/dL   Calcium 7.2 (L) 8.9 - 10.3 mg/dL   GFR calc non Af Amer >60 >60 mL/min   GFR calc Af Amer >60 >60 mL/min    Comment: (NOTE) The eGFR has been calculated using the CKD EPI  equation. This calculation has not been validated in all clinical situations. eGFR's persistently <60 mL/min signify possible Chronic Kidney Disease.    Anion gap 5 5 - 15  Magnesium     Status: Abnormal   Collection Time: 01/31/16  3:51 AM  Result Value Ref Range   Magnesium 1.4 (L) 1.7 - 2.4 mg/dL    ABGS No results for input(s): PHART, PO2ART, TCO2, HCO3 in the last 72 hours.  Invalid input(s): PCO2 CULTURES Recent Results (from the past 240 hour(s))  MRSA PCR Screening     Status: None   Collection Time: 01/25/16  1:40 PM  Result Value Ref Range Status   MRSA by PCR NEGATIVE NEGATIVE Final    Comment:        The GeneXpert MRSA Assay (FDA approved for NASAL specimens only), is one component of a comprehensive MRSA colonization surveillance program. It is not intended to diagnose MRSA infection nor to guide or monitor treatment for MRSA infections.   C difficile quick scan w PCR reflex     Status: Abnormal   Collection Time: 01/25/16  2:00 PM  Result Value Ref Range Status   C Diff antigen POSITIVE (A) NEGATIVE Final   C Diff toxin NEGATIVE NEGATIVE Final   C Diff interpretation   Final    C. difficile present, but toxin not detected. This indicates colonization. In most cases, this does not require treatment. If patient has signs and symptoms consistent with colitis, consider treatment. Requires ENTERIC precautions.   Studies/Results: No results found.  Medications:  Prior to Admission:  Prescriptions prior to admission  Medication Sig Dispense Refill Last Dose  . acetaminophen (TYLENOL) 500 MG tablet Take 500 mg by mouth every 6 (six) hours as needed for mild pain or moderate pain.   Past Week at Unknown time  . aspirin EC 81 MG tablet Take 81 mg by mouth daily.   01/25/2016 at Unknown time  . digoxin (LANOXIN) 0.125 MG tablet Take 1 tablet (0.125 mg total) by mouth daily. 90 tablet 2 01/25/2016 at  Unknown time  . Multiple Vitamins-Minerals (MULTIVITAMINS THER.  W/MINERALS) TABS tablet Take 1 tablet by mouth daily.   01/25/2016 at Unknown time  . OXYGEN Inhale 2 L into the lungs at bedtime.   01/24/2016 at Unknown time  . propranolol (INDERAL) 20 MG tablet Take 1.5 tablets (30 mg total) by mouth 3 (three) times daily. 30 tablet 0 01/25/2016 at 0800  . rivaroxaban (XARELTO) 20 MG TABS tablet Take 1 tablet (20 mg total) by mouth daily with supper. 90 tablet 0 01/24/2016 at Unknown time  . simvastatin (ZOCOR) 40 MG tablet Take 1 tablet by mouth daily.   01/25/2016 at Unknown time  . tamsulosin (FLOMAX) 0.4 MG CAPS capsule Take 1 capsule (0.4 mg total) by mouth daily. 90 capsule 2 01/25/2016 at Unknown time  . nicotine (NICODERM CQ - DOSED IN MG/24 HOURS) 14 mg/24hr patch Place 1 patch (14 mg total) onto the skin daily. (Patient not taking: Reported on 01/25/2016) 28 patch 0 Not Taking at Unknown time   Scheduled: . amiodarone  400 mg Oral BID  . antiseptic oral rinse  7 mL Mouth Rinse BID  . magnesium oxide  200 mg Oral BID  . nicotine  21 mg Transdermal Daily  . potassium chloride  20 mEq Oral QID  . sodium chloride flush  3 mL Intravenous Q12H   Continuous: . sodium chloride 100 mL/hr at 01/31/16 0600  . amiodarone Stopped (01/30/16 1055)   ZWC:HENIDPOEUMPNT **OR** acetaminophen, morphine injection, ondansetron **OR** ondansetron (ZOFRAN) IV  Assesment: He was admitted with acute blood loss anemia and acute GI bleeding. He's better with all of that. This was complicated by the fact that he had been placed on anticoagulation for new onset atrial fib. He has what appears to be severe mesenteric ischemia. He is scheduled for procedure on 02/02/2016. We have been avoiding medications that might make him hypotensive so he is on amiodarone but now on oral amiodarone. He is not anticoagulated now for obvious reasons with his GI bleeding. Principal Problem:   Acute blood loss anemia Active Problems:   Atrial fibrillation with RVR (HCC)   COPD (chronic obstructive  pulmonary disease) (HCC)   Hypertension   High cholesterol   Hypokalemia   Rectal bleeding   Protein-calorie malnutrition, severe   Diarrhea   Ischemic disease of gut (HCC)   Mesenteric ischemia ([place])    Plan: Continue treatments. I think he could move from the ICU. I had contemplated removing his Foley catheter but he has more hydronephrosis on his CT. This appears to be related to a kidney stone and that will need to be addressed once he is more clinically stable with his mesenteric ischemia. I'd like to get him up and out of a chair. I had ordered increased diet but after discussion with GI that may be put on hold for fear of causing increasing trouble with ischemia.    LOS: 6 days   Kaesha Kirsch L 01/31/2016, 10:26 AM

## 2016-01-31 NOTE — Progress Notes (Signed)
Subjective:  Patient without complaints. Prior to admission he did have some back pain, ?related to kidney stone. Denies pain now. No abdominal pain. No n/v. BM lessened and more formed.  Objective: Vital signs in last 24 hours: Temp:  [97.7 F (36.5 C)-98.7 F (37.1 C)] 98.7 F (37.1 C) (06/03 0400) Resp:  [18-26] 22 (06/03 0600) BP: (122-138)/(65-88) 136/80 mmHg (06/03 0600) SpO2:  [97 %] 97 % (06/02 2000) Weight:  [117 lb 15.1 oz (53.5 kg)] 117 lb 15.1 oz (53.5 kg) (06/03 0400) Last BM Date: 01/30/16 General:   Alert,  Well-developed, well-nourished, pleasant and cooperative in NAD Head:  Normocephalic and atraumatic. Eyes:  Sclera clear, no icterus.  Abdomen:  Soft, nontender and nondistended.Normal bowel sounds, without guarding, and without rebound.   Extremities:  Without clubbing, deformity or edema. Neurologic:  Alert and  oriented x4;  grossly normal neurologically. Skin:  Intact without significant lesions or rashes. Psych:  Alert and cooperative. Normal mood and affect.  Intake/Output from previous day: 06/02 0701 - 06/03 0700 In: 2400 [I.V.:2400] Out: 5600 [Urine:5600] Intake/Output this shift:    Lab Results: CBC  Recent Labs  01/29/16 0509 01/30/16 0431 01/31/16 0351  WBC 16.1* 12.6* 11.1*  HGB 8.2* 8.2* 8.3*  HCT 26.6* 26.2* 26.5*  MCV 87.2 86.5 86.9  PLT 265 233 228   BMET  Recent Labs  01/29/16 0509 01/30/16 0431 01/31/16 0351  NA 139 139 140  K 3.0* 2.8* 3.0*  CL 111 108 104  CO2 25 27 31   GLUCOSE 101* 90 85  BUN 8 <5* <5*  CREATININE 0.42* 0.38* 0.42*  CALCIUM 7.2* 7.3* 7.2*   LFTs No results for input(s): BILITOT, BILIDIR, IBILI, ALKPHOS, AST, ALT, PROT, ALBUMIN in the last 72 hours. No results for input(s): LIPASE in the last 72 hours. PT/INR No results for input(s): LABPROT, INR in the last 72 hours.    Imaging Studies: Ct Abdomen Pelvis W Wo Contrast  01/25/2016  CLINICAL DATA:  GI bleeding, prior portal venous gas on  previous CT EXAM: CT ABDOMEN AND PELVIS WITHOUT AND WITH CONTRAST TECHNIQUE: Multidetector CT imaging of the abdomen and pelvis was performed following the standard protocol before and following the bolus administration of intravenous contrast. CONTRAST:  163mL ISOVUE-300 IOPAMIDOL (ISOVUE-300) INJECTION 61% COMPARISON:  01/11/2016 FINDINGS: Lung bases are free of acute infiltrate or sizable effusion. Gallbladder has been surgically removed. The previously seen portal venous air within the left lobe of the liver is no longer identified. Portal vein is well visualized and demonstrates a normal branching pattern. No portal venous thrombosis is noted. Again no portal venous air is seen. The spleen, right adrenal gland and pancreas are stable in appearance from the prior exam. Small duodenal diverticulum is seen. Left adrenal gland demonstrates some hypertrophy but stable from the prior study. The kidneys are stable in appearance with cystic change and nonobstructing renal calculi bilaterally. The bladder is decompressed by Foley catheter. Prostatic calcifications are seen. No free pelvic fluid is noted. Mild diverticular change is noted without evidence of diverticulitis. No pooling of contrast to correspond with the given clinical history of GI bleeding is noted. Aortoiliac calcifications are seen. Tortuosity of the abdominal aorta is noted. Tube graft is noted within the distal aorta related to prior surgical repair. This is stable from the prior exam. The appendix is not well visualized although no inflammatory changes are seen. The osseous structures show a scoliosis of the lumbar spine concave to the left. This is stable from  the prior exam. No acute bony abnormality is noted. IMPRESSION: Resolution of previously seen portal venous gas. No findings to correspond with the patient's given clinical history of active GI bleeding are seen. Direct visualization may be helpful. Nuclear bleeding scan may also be helpful  for localization Remainder of the exam is stable from the prior study with evidence of bilateral renal calculi and right renal cystic change without obstructive change. Chronic changes as described. Electronically Signed   By: Inez Catalina M.D.   On: 01/25/2016 19:48   Ct Abdomen Pelvis W Contrast  01/11/2016  ADDENDUM REPORT: 01/11/2016 22:37 ADDENDUM: These results were called by telephone at the time of interpretation on <CurrentDate> at <CurrentTime> to Dr. Shanon Brow , who verbally acknowledged these results. Electronically Signed   By: Anner Crete M.D.   On: 01/11/2016 22:37  01/11/2016  CLINICAL DATA:  75 year old male with bilateral lower quadrant abdominal pain. EXAM: CT ABDOMEN AND PELVIS WITH CONTRAST TECHNIQUE: Multidetector CT imaging of the abdomen and pelvis was performed using the standard protocol following bolus administration of intravenous contrast. CONTRAST:  174mL ISOVUE-300 IOPAMIDOL (ISOVUE-300) INJECTION 61% COMPARISON:  None. FINDINGS: The visualized lung bases are clear. There is coronary vascular calcification. No intra-abdominal free air or free fluid. Cholecystectomy. Linear and branching air noted within the peripheral left lobe of the liver concerning for of the portal venous gas. The liver is otherwise unremarkable. The pancreas, spleen, and the right adrenal gland appear unremarkable. There is mild thickening of the left adrenal gland. Multiple small nonobstructing bilateral renal calculi measuring up to 4 mm. There is mild fullness of the renal collecting systems bilaterally. There is a 5 mm calculus in the proximal right ureter. There is a 2.6 cm exophytic hypodense lesion arising from the posterior cortex of the right kidney most compatible with a cyst. The urinary bladder is distended. There is trabecular appearance of the bladder wall compatible with chronic bladder outlet obstruction. The prostate gland is mildly enlarged and measures 4.5 cm in transverse diameter. There  is apparent thickening of the distal stomach in the region of the antrum and pylorus which may be related to underdistention or represent gastritis. Clinical correlation is recommended. Multiple small duodenal diverticula without definite active inflammation. There is no evidence of bowel obstruction. The visualized appendix appears unremarkable. There is aortoiliac atherosclerotic disease. There is focal area of advanced atherosclerotic plaque with narrowing of the aorta just inferior to the origins of the renal arteries. The aorta is tortuous. There is atherosclerotic calcifications of the origins of the celiac axis and SMA. The origins the celiac axis, SMA appear patent. The SMV, and main portal vein are patent. No portal venous gas identified. There is no adenopathy. Set the abdominal wall soft tissues appear unremarkable. There is osteopenia with degenerative changes of the spine. No acute fracture. IMPRESSION: Linear branching air in the left lobe of the liver concerning for portal venous gas. This is of indeterminate etiology, however the possibility of ischemic bowel is not excluded. Clinical correlation is recommended. Underdistention of the stomach versus gastritis. No evidence of bowel obstruction. No CT evidence for acute appendicitis. A 5 mm proximal right ureteral calculus with small nonobstructing bilateral renal calculi. Mild fullness of the renal collecting systems bilaterally. Trabecular appearance of the urinary bladder compatible with chronic bladder outlet obstruction. Electronically Signed: By: Anner Crete M.D. On: 01/11/2016 22:26   Dg Chest Port 1 View  01/13/2016  CLINICAL DATA:  R06.02 (ICD-10-CM) - SOB (shortness of breath)COPD (chronic  obstructive pulmonary disease) ([place]) J44.9Smokes 1ppd/60 yrs EXAM: PORTABLE CHEST 1 VIEW COMPARISON:  Chest CT, 07/23/2015.  Chest radiograph, 02/06/2015. FINDINGS: There is new opacity at the right lung base when compared to the prior studies  silhouetting the right hemidiaphragm. Mild opacities noted in the medial left lung base. Remainder of the lungs is clear. Left lung is hyperexpanded. No pneumothorax. Cardiac silhouette is normal in size. No mediastinal or hilar masses or convincing adenopathy. Bony thorax is demineralized but grossly intact. IMPRESSION: 1. New bilateral lung base opacity, greater on the right. This may be atelectasis, pneumonia or a combination. A central obstructing lesion is not excluded. Consider followup chest CT with contrast for further assessment. 2. No evidence of pulmonary edema. Electronically Signed   By: Lajean Manes M.D.   On: 01/13/2016 11:21   Dg Abd Acute W/chest  01/25/2016  CLINICAL DATA:  Diarrhea.  Abdominal pain.  Weakness. EXAM: DG ABDOMEN ACUTE W/ 1V CHEST COMPARISON:  01/13/2016. FINDINGS: Borderline enlarged cardiac silhouette. Clear lungs. Normal bowel gas pattern without free peritoneal air. Cholecystectomy clips. 6 mm nonspecific oval calcification overlying the right lower abdomen medially. Marked dextroconvex lumbar rotary scoliosis and degenerative changes. Mild to moderate thoracic scoliosis and degenerative changes. Mild left shoulder degenerative changes. Diffuse osteopenia. Atheromatous arterial calcifications. IMPRESSION: 1. No acute abnormality. 2. 6 mm oval calcification overlying the right lower abdomen, medially. This could potentially represent a ureteral calculus or phlebolith. An appendicolith is less likely. 3. Thoracolumbar scoliosis and degenerative changes. Electronically Signed   By: Claudie Revering M.D.   On: 01/25/2016 10:18   Ct Angio Abd/pel W/ And/or W/o  01/30/2016  ADDENDUM REPORT: 01/30/2016 14:55 ADDENDUM: 5 x 6 mm ureteral stone on the right just above the right iliac artery causing moderate hydronephrosis of the right kidney. Additional small bilateral renal calculi bilaterally. On the CT of 01/25/2016, the stone was in the proximal right ureter not causing significant  obstruction. Electronically Signed   By: Franchot Gallo M.D.   On: 01/30/2016 14:55  01/30/2016  CLINICAL DATA:  75 year old male with generalized abdominal pain and recent GI bleed. Evaluate for mesenteric ischemia. EXAM: CTA ABDOMEN AND PELVIS wITHOUT AND WITH CONTRAST TECHNIQUE: Multidetector CT imaging of the abdomen and pelvis was performed using the standard protocol during bolus administration of intravenous contrast. Multiplanar reconstructed images and MIPs were obtained and reviewed to evaluate the vascular anatomy. CONTRAST:  100 mL Isovue 370 COMPARISON:  Prior CTA abdomen and pelvis 01/25/2016 FINDINGS: VASCULAR Aorta: Surgical changes of prior tube graft repair of reported abdominal aortic aneurysm. There is focal narrowing of the aortic lumen in the region of the proximal anastomosis. The aortic lumen measures only 12 mm at this location in there is extensive peripheral calcification. No evidence of aneurysm or pseudoaneurysm. Celiac: Heavily diseased origin the celiac artery secondary to bulky calcified atherosclerotic plaque. There is either high-grade stenosis or focal occlusion of the origin of the celiac artery. The splenic artery is relatively small in caliber. Conventional hepatic arterial anatomy. SMA: Approximately 1.5 cm occlusion of the origin of the superior mesenteric artery. There is bulky calcified plaque surrounding the origin. Renals: Solitary bilateral renal arteries. Predominately calcified plaque results in at least moderate stenosis of the left renal artery origin. Heterogeneous fibro fatty and calcified plaque results in mild narrowing of the right renal artery origin. No renal artery aneurysm or fibromuscular dysplasia. IMA: The origin has been ligated. The distal branches opacified via retrograde flow. Inflow: Relatively high-grade focal stenoses of  the most proximal bilateral iliac arteries secondary to bulky calcified plaque. Mild and possibly poststenotic aneurysmal  dilatation of the iliac arteries measuring up to 1.9 cm on the right and 1.6 cm on the left. The bilateral internal and external iliac arteries remain patent. Proximal Outflow: Bulky calcified atherosclerotic plaque along the posterior wall of the common femoral arteries bilaterally. The visualized proximal profunda and superficial femoral arteries remain patent. Veins: No focal venous abnormality. NON-VASCULAR Lower Chest: Small bilateral layering pleural effusions. There is associated lower lobe atelectasis mild lower lobe bronchial wall thickening. Cardiomegaly. Atherosclerotic calcifications noted along the visualized coronary arteries. No pericardial effusion. Unremarkable distal thoracic esophagus. Abdomen: Unremarkable CT appearance of the pancreas, spleen, and adrenal glands for age. Grossly unremarkable stomach and duodenum. No evidence of obstruction or focal bowel wall thickening. No focal pneumatosis or free air. Normal hepatic contour and morphology. No discrete hepatic lesions. The gallbladder is surgically absent. No intra or extrahepatic biliary ductal dilatation. Mild periportal edema. No evidence of portal venous gas. Diffuse mild mesenteric edema.  Trace free fluid. Pelvis: Foley catheter is noted within the bladder. The bladder wall appears somewhat thickened although evaluation is limited by underdistention. Bones/Soft Tissues: Mild anasarca. No acute fracture or aggressive appearing lytic or blastic osseous lesion. Degenerative dextro convex scoliosis centered at L1-L2. Review of the MIP images confirms the above findings. IMPRESSION: VASCULAR 1. Severe and largely heavily calcified vascular disease with high-grade stenosis versus occlusion of the celiac artery, occlusion of the origin of the superior mesenteric artery and surgical ligation of the inferior mesenteric artery. These findings are highly consistent with a chronic mesenteric ischemia affecting the entirety of the gastrointestinal  tract. Nearly all flow to the gastrointestinal tract may be via collateral pathways. 2. Surgical changes of prior infrarenal abdominal aortic repair with aortic tube graft extending from the infrarenal aorta to just proximal to the aortic bifurcation. 3. Focal moderate calcified stenosis of the infrarenal abdominal aorta in the region of the proximal tube graft anastomosis. 4. Focal high-grade calcified stenoses of the bilateral common iliac artery origins followed by poststenotic dilatation as described above. 5. Bulky calcified atherosclerotic plaque in both common femoral arteries resulting in mild to moderate stenosis. 6. Primarily fibro fatty plaque results in a mild narrowing of the right renal artery origin. 7. Calcified plaque results in moderate narrowing of the left renal artery origin. NON VASCULAR 1. No evidence of pneumatosis, portal venous air, or free air. 2. No evidence of obstruction or focal bowel wall thickening. 3. Bilateral pleural effusions, periportal edema, anasarca, small volume ascites and mesenteric edema all consistent with volume overload versus CHF or third-spacing. 4. Bilateral lower lobe atelectasis and bronchial wall thickening. 5. Additional ancillary findings as above without significant interval change. Signed, Criselda Peaches, MD [facility] Electronically Signed: By: Jacqulynn Cadet M.D. On: 01/29/2016 11:35  [2 weeks]   Assessment: 75 year old gentleman presented with hemodynamically significant GI bleed in the setting of recently initiated anticoagulation therapy for new onset atrial fibrillation. Anticoagulation therapy reversed with Kcentra at admission. Patient has received 2 units of packed blood cells. Area of pneumatosis in the small bowel seen on prior CT, no evidence of aortoenteric fistula. He does have significant mesenteric atherosclerosis.  Ileocolonoscopy showed marked inflammatory changes  with ulceration involving most of the more proximal colon with more extreme inflammatory changes extending up into the terminal ileum. Status post biopsies. Upper endoscopy showed markedly abnormal stomach and duodenum with geographic ulceration  and erosions in the stomach, mucosa edematous with marked friability. Inflammatory changes extended down into the fourth portion of duodenum became more pronounced with ulceration distally.   Significant leukocytosis and recent pneumatosis noted on prior CT-now not evident on current CT. C. difficile antigen positive in the setting of diarrhea following hospitalization and antibiotic exposure. Vancomycin started (no colitis on CT).Leukocytosis could be due to C. difficile or urinary tract infection in the setting of the right obstructive uropathy. Leukocytosis continues to improve.  CT Angiogram performed demonstrating significant atherosclerotic disease with gut perfusion primarily from collateral circulation. IR consulted and feel he would benefit from angiogram and intervention but would prefer to be out of ICU, unless changes in presentation such as onset of abdominal pain.  Patient continues to deny abdominal pain. No further bleeding noted. VSS stable. Afebrile. Has transitioned to oral amiodarone for over 24 hours.     Plan: 1. Continue to optimize flow state and gut perfusion. Patient currently on oral amiodarone. 2. Discussed with Dr. Gala Romney, maintain full liquid diet until IR intervention. 3. Notify immediately if significant changes (new abdominal pain, mental status changes, vital sign instability, spike in fever, etc.). Patient, wife, and nursing staff aware.  4. If patient is out of ICU over the weekend, IR may consider intervention over the weekend. Otherwise, on the schedule for Monday.  Laureen Ochs. Bernarda Caffey [facility] [phone] 6/3/201711:22 AM     LOS: 6 days      Addendum: Spoke with Dr. Larena Glassman, on call Interventional Radiologist. Discussed patient's stability and now out of the ICU. He recommends transfer to [facility] in AM and proceed with mesenteric arteriogram Monday as scheduled. I will clarify if [facility] or [facility]. To discuss with Dr. Luan Pulling in morning.   Laureen Ochs. Bernarda Caffey [facility] [phone] 6/3/20177:38 PM

## 2016-01-31 NOTE — Progress Notes (Signed)
Patient ID: Steve Gomez, male   DOB: [birth year], 75 y.o.   MRN: [id number]    Cardiology Office Note    Date:  01/31/2016   ID:  Steve Gomez, DOB Dec 28, 1940, MRN [id number]  PCP:  Steve Bogus, MD  Cardiologist: Dr. Johnsie Cancel   Chief complaint post hospital for atrial fibrillation  History of Present Illness:  Steve Gomez is a 75 y.o. male with a history of AAA s/p repair, HLD, HTN and COPD/tobacco abuse who presented to [facility] ED on 01/12/16 with abdominal pain with N/V. CT findings concerning for portal venous gas and found to be in afib with RVR and he was transferred to [facility] for further evaluation and treatment. He was initially treated with IV heparin and Cardizem drip but this was discontinued due to hypotension. He was then started on labetalol 100 mg twice a day and digoxin 0.125 mg daily.rate was not controlled so he was started on Inderal 20 mg 3 times a day and labetalol wall was stopped. CHADSVASC=3.Xarelto was started.he was found to have acute urinary retention and bilateral renal calculi with fullness of the renal collecting system and Foley was placed.2-D echo showed normal LV function EF 55-60% moderate MR and TR.  Patient's brother died 3 weeks ago   His heart rate is 125 beats per minute today the office. He is taking the Inderal 3 times daily. Blood pressure is 104/68.Patient is asymptomatic with his atrial fibrillation. He cannot feel it racing. He denies any chest pain, palpitations, dyspnea, dyspnea on exertion, dizziness or presyncope.     Past Medical History  Diagnosis Date  . COPD (chronic obstructive pulmonary disease) ([place])   . Hypertension   . High cholesterol   . S/P AAA repair   . Renal calculi 01/14/2016  . New onset atrial fibrillation ([place])   . Bladder outlet obstruction 01/14/2016  . Atherosclerosis   . Tobacco abuse     Past Surgical History  Procedure Laterality Date  . Cholecystectomy    . Abdominal aortic aneurysm repair      Current  Medications: Facility-Administered Medications Prior to Visit  Medication Dose Route Frequency Provider Last Rate Last Dose  . 0.9 %  sodium chloride infusion   Intravenous Continuous Donne Hazel, MD 100 mL/hr at 01/31/16 1826    . acetaminophen (TYLENOL) tablet 650 mg  650 mg Oral Q6H PRN Donne Hazel, MD       Or  . acetaminophen (TYLENOL) suppository 650 mg  650 mg Rectal Q6H PRN Donne Hazel, MD      . amiodarone (NEXTERONE PREMIX) 360-4.14 MG/200ML-% (1.8 mg/mL) IV infusion  30 mg/hr Intravenous Continuous Arnoldo Lenis, MD   Stopped at 01/30/16 1055  . amiodarone (PACERONE) tablet 400 mg  400 mg Oral BID Arnoldo Lenis, MD   400 mg at 01/31/16 0912  . antiseptic oral rinse (CPC / CETYLPYRIDINIUM CHLORIDE 0.05%) solution 7 mL  7 mL Mouth Rinse BID Sinda Du, MD   7 mL at 01/31/16 0913  . magnesium oxide (MAG-OX) tablet 200 mg  200 mg Oral BID Lendon Colonel, NP   200 mg at 01/31/16 0912  . morphine 2 MG/ML injection 2 mg  2 mg Intravenous Q4H PRN Donne Hazel, MD      . nicotine (NICODERM CQ - dosed in mg/24 hours) patch 21 mg  21 mg Transdermal Daily Sinda Du, MD   21 mg at 01/31/16 0925  . ondansetron (ZOFRAN) tablet 4 mg  4 mg  Oral Q6H PRN Donne Hazel, MD       Or  . ondansetron [facility]) injection 4 mg  4 mg Intravenous Q6H PRN Donne Hazel, MD      . potassium chloride SA (K-DUR,KLOR-CON) CR tablet 20 mEq  20 mEq Oral QID Sinda Du, MD   20 mEq at 01/31/16 1747  . sodium chloride flush (NS) 0.9 % injection 3 mL  3 mL Intravenous Q12H Donne Hazel, MD   3 mL at 01/31/16 [id number]   Outpatient Prescriptions Prior to Visit  Medication Sig Dispense Refill  . acetaminophen (TYLENOL) 500 MG tablet Take 500 mg by mouth every 6 (six) hours as needed for mild pain or moderate pain.    Marland Kitchen aspirin EC 81 MG tablet Take 81 mg by mouth daily.    . digoxin (LANOXIN) 0.125 MG tablet Take 1 tablet (0.125 mg total) by mouth daily. 90 tablet 2  . Multiple  Vitamins-Minerals (MULTIVITAMINS THER. W/MINERALS) TABS tablet Take 1 tablet by mouth daily.    . nicotine (NICODERM CQ - DOSED IN MG/24 HOURS) 14 mg/24hr patch Place 1 patch (14 mg total) onto the skin daily. (Patient not taking: Reported on 01/25/2016) 28 patch 0  . OXYGEN Inhale 2 L into the lungs at bedtime.    . propranolol (INDERAL) 20 MG tablet Take 1.5 tablets (30 mg total) by mouth 3 (three) times daily. 30 tablet 0  . rivaroxaban (XARELTO) 20 MG TABS tablet Take 1 tablet (20 mg total) by mouth daily with supper. 90 tablet 0  . simvastatin (ZOCOR) 40 MG tablet Take 1 tablet by mouth daily.    . tamsulosin (FLOMAX) 0.4 MG CAPS capsule Take 1 capsule (0.4 mg total) by mouth daily. 90 capsule 2     Allergies:   Review of patient's allergies indicates no known allergies.   Social History   Social History  . Marital Status: Married    Spouse Name: N/A  . Number of Children: N/A  . Years of Education: N/A   Social History Main Topics  . Smoking status: Current Every Day Smoker -- 1.00 packs/day    Types: Cigarettes    Start date: 08/30/1960  . Smokeless tobacco: Not on file  . Alcohol Use: 0.0 oz/week    0 Standard drinks or equivalent per week     Comment: "just a couple of swallows of wine every night"  . Drug Use: No  . Sexual Activity: Not on file   Other Topics Concern  . Not on file   Social History Narrative     Family History:  The patient's    family history includes Diabetes in his brother, brother, and brother; Suicidality in his father.   ROS:   Please see the history of present illness.    Review of Systems  Constitution: Positive for decreased appetite and weakness.  HENT: Positive for hearing loss.   Cardiovascular: Negative.   Respiratory: Negative.   Endocrine: Negative.   Hematologic/Lymphatic: Negative.   Musculoskeletal: Positive for arthritis and back pain.  Gastrointestinal: Negative.   Genitourinary: Negative.        Foley catheter in place     All other systems reviewed and are negative.   PHYSICAL EXAM:   VS:  There were no vitals taken for this visit.   GEN: Well nourished, well developed, in no acute distress HEENT: normal Neck: no JVD, carotid bruits, or masses Cardiac:  Irregular irregular at 130 beats per minute no murmurs, rubs, or  gallops,no edema  Respiratory:  clear to auscultation bilaterally, normal work of breathing GI: soft, nontender, nondistended, + BS MS: no deformity or atrophy Skin: warm and dry, no rash Neuro:  Alert and Oriented x 3, Strength and sensation are intact Psych: euthymic mood, full affect  Wt Readings from Last 3 Encounters:  01/31/16 53.5 kg (117 lb 15.1 oz)  01/23/16 53.071 kg (117 lb)  01/21/16 54.25 kg (119 lb 9.6 oz)      Studies/Labs Reviewed:   EKG:  01/14/16  atrial fibrillation at 130 beats per minute with anterior fascicular block and nonspecific ST-T wave changes  Recent Labs: 01/14/2016: TSH 1.248 01/27/2016: ALT 18 01/31/2016: BUN <5*; Creatinine, Ser 0.42*; Hemoglobin 8.3*; Magnesium 1.4*; Platelets 228; Potassium 3.0*; Sodium 140   Lipid Panel    Component Value Date/Time   CHOL 77 01/15/2016 0223   TRIG 107 01/15/2016 0223   HDL 20* 01/15/2016 0223   CHOLHDL 3.9 01/15/2016 0223   VLDL 21 01/15/2016 0223   LDLCALC 36 01/15/2016 0223    Additional studies/ records that were reviewed today include:  2-D echo 01/14/16  Study Conclusions   - Left ventricle: The cavity size was normal. Wall thickness was   normal. Systolic function was normal. The estimated ejection   fraction was in the range of 55% to 60%. Wall motion was normal;   there were no regional wall motion abnormalities. - Mitral valve: Calcified annulus. There was moderate   regurgitation. - Tricuspid valve: There was moderate regurgitation.      ASSESSMENT:    No diagnosis found.   PLAN:  In order of problems listed above:  Atrial fibrillation with RVR patient's heart rate is still not  controlled on Inderal 20 mg 3 times a day. His blood pressure is on the low side. He is asymptomatic.    Hypertension blood pressure is on the low side  Tobacco abuse patient is trying to quit smoking and is wearing the nicotine patches    Medication Adjustments/Labs and Tests Ordered: Current medicines are reviewed at length with the patient today.  Concerns regarding medicines are outlined above.  Medication changes, Labs and Tests ordered today are listed in the Patient Instructions below. There are no Patient Instructions on file for this visit.   Signed, Jenkins Rouge, MD  01/31/2016 7:13 PM    [facility] [address], [place], [place]  [address] Phone: [phone]; Fax: [phone]    This encounter was created in error - please disregard.

## 2016-02-01 LAB — CBC WITH DIFFERENTIAL/PLATELET
BASOS PCT: 0 %
Basophils Absolute: 0 10*3/uL (ref 0.0–0.1)
Eosinophils Absolute: 0.1 10*3/uL (ref 0.0–0.7)
Eosinophils Relative: 1 %
HEMATOCRIT: 27.2 % — AB (ref 39.0–52.0)
HEMOGLOBIN: 8.5 g/dL — AB (ref 13.0–17.0)
LYMPHS ABS: 1.2 10*3/uL (ref 0.7–4.0)
LYMPHS PCT: 11 %
MCH: 27.2 pg (ref 26.0–34.0)
MCHC: 31.3 g/dL (ref 30.0–36.0)
MCV: 87.2 fL (ref 78.0–100.0)
MONO ABS: 1.1 10*3/uL — AB (ref 0.1–1.0)
MONOS PCT: 10 %
NEUTROS ABS: 8.6 10*3/uL — AB (ref 1.7–7.7)
NEUTROS PCT: 78 %
Platelets: 226 10*3/uL (ref 150–400)
RBC: 3.12 MIL/uL — ABNORMAL LOW (ref 4.22–5.81)
RDW: 15.8 % — ABNORMAL HIGH (ref 11.5–15.5)
WBC: 11 10*3/uL — ABNORMAL HIGH (ref 4.0–10.5)

## 2016-02-01 LAB — BASIC METABOLIC PANEL
ANION GAP: 5 (ref 5–15)
BUN: <5 mg/dL — ABNORMAL LOW (ref 6–20)
CALCIUM: 7.5 mg/dL — AB (ref 8.9–10.3)
CHLORIDE: 100 mmol/L — AB (ref 101–111)
CO2: 33 mmol/L — AB (ref 22–32)
Creatinine, Ser: 0.42 mg/dL — ABNORMAL LOW (ref 0.61–1.24)
GFR calc non Af Amer: >60 mL/min (ref >60)
GLUCOSE: 91 mg/dL (ref 65–99)
POTASSIUM: 3.4 mmol/L — AB (ref 3.5–5.1)
Sodium: 138 mmol/L (ref 135–145)

## 2016-02-01 NOTE — Procedures (Signed)
Pt is using home cpap with home settings. RT will continue to monitor.

## 2016-02-01 NOTE — Progress Notes (Signed)
Transfer from [facility] received Dr. Rubie Maid  Mr. Steve Gomez 75 year old male with a pmh of HTN, HLD, COPD, tobacco abuse, AAA s/p repair, and recently hospitaslized 5/14- 5/20 diagnosed afib placed on Xarelto; who initially presented to [facility] with complaints of rectal bleeding on 5/28. Initial hemoglobin was noted to be 7.4, transfused 2 units of PRBC. CT scan obtained on 61 showed significant signs of vascular disease of the mesenteric arteries. Evaluated by surgery, GI,  Cardiology, and IR.  Has recommended patient be transferred to [facility] called for further workup by interventional radiology. Dr. Luan Pulling reports talking with Dr. Larena Glassman of IR how reports scheduled procedure in am. Would consult IR arrival to verify possible procedure in a.m.  vitals otherwise stable as well as hemoglobin at  8.5. Transferring to a Telemetry bed.

## 2016-02-01 NOTE — Progress Notes (Signed)
He feels well with no complaints. He is stable.  Exam shows he is awake and alert. He still is in atrial fib. His abdomen is soft. He has no edema. Central nervous system exam grossly intact  He had GI bleeding that is felt to be related to mesenteric ischemia and low flow state. He is much improved. He is going to be transferred to [facility] for angiogram. Interventional radiology request that he be transferred to the hospitalist service today in anticipation of the angiogram tomorrow and I am working on that

## 2016-02-01 NOTE — Progress Notes (Signed)
Stage I noted on patient buttocks,measured and documented.Midlower back redness,blanchable, both dressed with pink foam mapilex.

## 2016-02-01 NOTE — Progress Notes (Addendum)
Report called to RN at [facility]. Patient transferred to [facility] by [facility]

## 2016-02-01 NOTE — Progress Notes (Signed)
Subjective:  No complaints.   Objective: Vital signs in last 24 hours: Temp:  [97.5 F (36.4 C)-98.7 F (37.1 C)] 98.6 F (37 C) (06/04 [id number]) Pulse Rate:  [66-88] 88 (06/04 0614) Resp:  [20-30] 20 (06/04 0614) BP: (121-143)/(71-93) 123/71 mmHg (06/04 0614) SpO2:  [96 %-99 %] 96 % (06/04 0614) Last BM Date: 01/30/16 General:   Alert,  Well-developed, well-nourished, pleasant and cooperative in NAD Head:  Normocephalic and atraumatic. Eyes:  Sclera clear, no icterus.  Abdomen:  Soft, nontender and nondistended.  Normal bowel sounds, without guarding, and without rebound.   Extremities:  Without clubbing, deformity or edema. Neurologic:  Alert and  oriented x4;  grossly normal neurologically. Skin:  Intact without significant lesions or rashes. Psych:  Alert and cooperative. Normal mood and affect.  Intake/Output from previous day: 06/03 0701 - 06/04 0700 In: 3275 [P.O.:840; I.V.:2435] Out: 5250 [Urine:5250] Intake/Output this shift:    Lab Results: CBC  Recent Labs  01/30/16 0431 01/31/16 0351 02/01/16 0555  WBC 12.6* 11.1* 11.0*  HGB 8.2* 8.3* 8.5*  HCT 26.2* 26.5* 27.2*  MCV 86.5 86.9 87.2  PLT 233 228 226   BMET  Recent Labs  01/30/16 0431 01/31/16 0351 02/01/16 0555  NA 139 140 138  K 2.8* 3.0* 3.4*  CL 108 104 100*  CO2 27 31 33*  GLUCOSE 90 85 91  BUN <5* <5* <5*  CREATININE 0.38* 0.42* 0.42*  CALCIUM 7.3* 7.2* 7.5*   LFTs No results for input(s): BILITOT, BILIDIR, IBILI, ALKPHOS, AST, ALT, PROT, ALBUMIN in the last 72 hours. No results for input(s): LIPASE in the last 72 hours. PT/INR No results for input(s): LABPROT, INR in the last 72 hours.    Imaging Studies: Ct Abdomen Pelvis W Wo Contrast  01/25/2016  CLINICAL DATA:  GI bleeding, prior portal venous gas on previous CT EXAM: CT ABDOMEN AND PELVIS WITHOUT AND WITH CONTRAST TECHNIQUE: Multidetector CT imaging of the abdomen and pelvis was performed following the standard protocol before and  following the bolus administration of intravenous contrast. CONTRAST:  134mL ISOVUE-300 IOPAMIDOL (ISOVUE-300) INJECTION 61% COMPARISON:  01/11/2016 FINDINGS: Lung bases are free of acute infiltrate or sizable effusion. Gallbladder has been surgically removed. The previously seen portal venous air within the left lobe of the liver is no longer identified. Portal vein is well visualized and demonstrates a normal branching pattern. No portal venous thrombosis is noted. Again no portal venous air is seen. The spleen, right adrenal gland and pancreas are stable in appearance from the prior exam. Small duodenal diverticulum is seen. Left adrenal gland demonstrates some hypertrophy but stable from the prior study. The kidneys are stable in appearance with cystic change and nonobstructing renal calculi bilaterally. The bladder is decompressed by Foley catheter. Prostatic calcifications are seen. No free pelvic fluid is noted. Mild diverticular change is noted without evidence of diverticulitis. No pooling of contrast to correspond with the given clinical history of GI bleeding is noted. Aortoiliac calcifications are seen. Tortuosity of the abdominal aorta is noted. Tube graft is noted within the distal aorta related to prior surgical repair. This is stable from the prior exam. The appendix is not well visualized although no inflammatory changes are seen. The osseous structures show a scoliosis of the lumbar spine concave to the left. This is stable from the prior exam. No acute bony abnormality is noted. IMPRESSION: Resolution of previously seen portal venous gas. No findings to correspond with the patient's given clinical history of active GI bleeding  are seen. Direct visualization may be helpful. Nuclear bleeding scan may also be helpful for localization Remainder of the exam is stable from the prior study with evidence of bilateral renal calculi and right renal cystic change without obstructive change. Chronic changes  as described. Electronically Signed   By: Inez Catalina M.D.   On: 01/25/2016 19:48   Ct Abdomen Pelvis W Contrast  01/11/2016  ADDENDUM REPORT: 01/11/2016 22:37 ADDENDUM: These results were called by telephone at the time of interpretation on <CurrentDate> at <CurrentTime> to Dr. Shanon Brow , who verbally acknowledged these results. Electronically Signed   By: Anner Crete M.D.   On: 01/11/2016 22:37  01/11/2016  CLINICAL DATA:  75 year old male with bilateral lower quadrant abdominal pain. EXAM: CT ABDOMEN AND PELVIS WITH CONTRAST TECHNIQUE: Multidetector CT imaging of the abdomen and pelvis was performed using the standard protocol following bolus administration of intravenous contrast. CONTRAST:  185mL ISOVUE-300 IOPAMIDOL (ISOVUE-300) INJECTION 61% COMPARISON:  None. FINDINGS: The visualized lung bases are clear. There is coronary vascular calcification. No intra-abdominal free air or free fluid. Cholecystectomy. Linear and branching air noted within the peripheral left lobe of the liver concerning for of the portal venous gas. The liver is otherwise unremarkable. The pancreas, spleen, and the right adrenal gland appear unremarkable. There is mild thickening of the left adrenal gland. Multiple small nonobstructing bilateral renal calculi measuring up to 4 mm. There is mild fullness of the renal collecting systems bilaterally. There is a 5 mm calculus in the proximal right ureter. There is a 2.6 cm exophytic hypodense lesion arising from the posterior cortex of the right kidney most compatible with a cyst. The urinary bladder is distended. There is trabecular appearance of the bladder wall compatible with chronic bladder outlet obstruction. The prostate gland is mildly enlarged and measures 4.5 cm in transverse diameter. There is apparent thickening of the distal stomach in the region of the antrum and pylorus which may be related to underdistention or represent gastritis. Clinical correlation is recommended.  Multiple small duodenal diverticula without definite active inflammation. There is no evidence of bowel obstruction. The visualized appendix appears unremarkable. There is aortoiliac atherosclerotic disease. There is focal area of advanced atherosclerotic plaque with narrowing of the aorta just inferior to the origins of the renal arteries. The aorta is tortuous. There is atherosclerotic calcifications of the origins of the celiac axis and SMA. The origins the celiac axis, SMA appear patent. The SMV, and main portal vein are patent. No portal venous gas identified. There is no adenopathy. Set the abdominal wall soft tissues appear unremarkable. There is osteopenia with degenerative changes of the spine. No acute fracture. IMPRESSION: Linear branching air in the left lobe of the liver concerning for portal venous gas. This is of indeterminate etiology, however the possibility of ischemic bowel is not excluded. Clinical correlation is recommended. Underdistention of the stomach versus gastritis. No evidence of bowel obstruction. No CT evidence for acute appendicitis. A 5 mm proximal right ureteral calculus with small nonobstructing bilateral renal calculi. Mild fullness of the renal collecting systems bilaterally. Trabecular appearance of the urinary bladder compatible with chronic bladder outlet obstruction. Electronically Signed: By: Anner Crete M.D. On: 01/11/2016 22:26   Dg Chest Port 1 View  01/13/2016  CLINICAL DATA:  R06.02 (ICD-10-CM) - SOB (shortness of breath)COPD (chronic obstructive pulmonary disease) ([place]) J44.9Smokes 1ppd/60 yrs EXAM: PORTABLE CHEST 1 VIEW COMPARISON:  Chest CT, 07/23/2015.  Chest radiograph, 02/06/2015. FINDINGS: There is new opacity at the right lung base  when compared to the prior studies silhouetting the right hemidiaphragm. Mild opacities noted in the medial left lung base. Remainder of the lungs is clear. Left lung is hyperexpanded. No pneumothorax. Cardiac silhouette is  normal in size. No mediastinal or hilar masses or convincing adenopathy. Bony thorax is demineralized but grossly intact. IMPRESSION: 1. New bilateral lung base opacity, greater on the right. This may be atelectasis, pneumonia or a combination. A central obstructing lesion is not excluded. Consider followup chest CT with contrast for further assessment. 2. No evidence of pulmonary edema. Electronically Signed   By: Lajean Manes M.D.   On: 01/13/2016 11:21   Dg Abd Acute W/chest  01/25/2016  CLINICAL DATA:  Diarrhea.  Abdominal pain.  Weakness. EXAM: DG ABDOMEN ACUTE W/ 1V CHEST COMPARISON:  01/13/2016. FINDINGS: Borderline enlarged cardiac silhouette. Clear lungs. Normal bowel gas pattern without free peritoneal air. Cholecystectomy clips. 6 mm nonspecific oval calcification overlying the right lower abdomen medially. Marked dextroconvex lumbar rotary scoliosis and degenerative changes. Mild to moderate thoracic scoliosis and degenerative changes. Mild left shoulder degenerative changes. Diffuse osteopenia. Atheromatous arterial calcifications. IMPRESSION: 1. No acute abnormality. 2. 6 mm oval calcification overlying the right lower abdomen, medially. This could potentially represent a ureteral calculus or phlebolith. An appendicolith is less likely. 3. Thoracolumbar scoliosis and degenerative changes. Electronically Signed   By: Claudie Revering M.D.   On: 01/25/2016 10:18   Ct Angio Abd/pel W/ And/or W/o  01/30/2016  ADDENDUM REPORT: 01/30/2016 14:55 ADDENDUM: 5 x 6 mm ureteral stone on the right just above the right iliac artery causing moderate hydronephrosis of the right kidney. Additional small bilateral renal calculi bilaterally. On the CT of 01/25/2016, the stone was in the proximal right ureter not causing significant obstruction. Electronically Signed   By: Franchot Gallo M.D.   On: 01/30/2016 14:55  01/30/2016  CLINICAL DATA:  75 year old male with generalized abdominal pain and recent GI bleed.  Evaluate for mesenteric ischemia. EXAM: CTA ABDOMEN AND PELVIS wITHOUT AND WITH CONTRAST TECHNIQUE: Multidetector CT imaging of the abdomen and pelvis was performed using the standard protocol during bolus administration of intravenous contrast. Multiplanar reconstructed images and MIPs were obtained and reviewed to evaluate the vascular anatomy. CONTRAST:  100 mL Isovue 370 COMPARISON:  Prior CTA abdomen and pelvis 01/25/2016 FINDINGS: VASCULAR Aorta: Surgical changes of prior tube graft repair of reported abdominal aortic aneurysm. There is focal narrowing of the aortic lumen in the region of the proximal anastomosis. The aortic lumen measures only 12 mm at this location in there is extensive peripheral calcification. No evidence of aneurysm or pseudoaneurysm. Celiac: Heavily diseased origin the celiac artery secondary to bulky calcified atherosclerotic plaque. There is either high-grade stenosis or focal occlusion of the origin of the celiac artery. The splenic artery is relatively small in caliber. Conventional hepatic arterial anatomy. SMA: Approximately 1.5 cm occlusion of the origin of the superior mesenteric artery. There is bulky calcified plaque surrounding the origin. Renals: Solitary bilateral renal arteries. Predominately calcified plaque results in at least moderate stenosis of the left renal artery origin. Heterogeneous fibro fatty and calcified plaque results in mild narrowing of the right renal artery origin. No renal artery aneurysm or fibromuscular dysplasia. IMA: The origin has been ligated. The distal branches opacified via retrograde flow. Inflow: Relatively high-grade focal stenoses of the most proximal bilateral iliac arteries secondary to bulky calcified plaque. Mild and possibly poststenotic aneurysmal dilatation of the iliac arteries measuring up to 1.9 cm on the right and 1.6  cm on the left. The bilateral internal and external iliac arteries remain patent. Proximal Outflow: Bulky  calcified atherosclerotic plaque along the posterior wall of the common femoral arteries bilaterally. The visualized proximal profunda and superficial femoral arteries remain patent. Veins: No focal venous abnormality. NON-VASCULAR Lower Chest: Small bilateral layering pleural effusions. There is associated lower lobe atelectasis mild lower lobe bronchial wall thickening. Cardiomegaly. Atherosclerotic calcifications noted along the visualized coronary arteries. No pericardial effusion. Unremarkable distal thoracic esophagus. Abdomen: Unremarkable CT appearance of the pancreas, spleen, and adrenal glands for age. Grossly unremarkable stomach and duodenum. No evidence of obstruction or focal bowel wall thickening. No focal pneumatosis or free air. Normal hepatic contour and morphology. No discrete hepatic lesions. The gallbladder is surgically absent. No intra or extrahepatic biliary ductal dilatation. Mild periportal edema. No evidence of portal venous gas. Diffuse mild mesenteric edema.  Trace free fluid. Pelvis: Foley catheter is noted within the bladder. The bladder wall appears somewhat thickened although evaluation is limited by underdistention. Bones/Soft Tissues: Mild anasarca. No acute fracture or aggressive appearing lytic or blastic osseous lesion. Degenerative dextro convex scoliosis centered at L1-L2. Review of the MIP images confirms the above findings. IMPRESSION: VASCULAR 1. Severe and largely heavily calcified vascular disease with high-grade stenosis versus occlusion of the celiac artery, occlusion of the origin of the superior mesenteric artery and surgical ligation of the inferior mesenteric artery. These findings are highly consistent with a chronic mesenteric ischemia affecting the entirety of the gastrointestinal tract. Nearly all flow to the gastrointestinal tract may be via collateral pathways. 2. Surgical changes of prior infrarenal abdominal aortic repair with aortic tube graft extending  from the infrarenal aorta to just proximal to the aortic bifurcation. 3. Focal moderate calcified stenosis of the infrarenal abdominal aorta in the region of the proximal tube graft anastomosis. 4. Focal high-grade calcified stenoses of the bilateral common iliac artery origins followed by poststenotic dilatation as described above. 5. Bulky calcified atherosclerotic plaque in both common femoral arteries resulting in mild to moderate stenosis. 6. Primarily fibro fatty plaque results in a mild narrowing of the right renal artery origin. 7. Calcified plaque results in moderate narrowing of the left renal artery origin. NON VASCULAR 1. No evidence of pneumatosis, portal venous air, or free air. 2. No evidence of obstruction or focal bowel wall thickening. 3. Bilateral pleural effusions, periportal edema, anasarca, small volume ascites and mesenteric edema all consistent with volume overload versus CHF or third-spacing. 4. Bilateral lower lobe atelectasis and bronchial wall thickening. 5. Additional ancillary findings as above without significant interval change. Signed, Criselda Peaches, MD [facility] Electronically Signed: By: Jacqulynn Cadet M.D. On: 01/29/2016 11:35  [2 weeks]   Assessment: 75 year old gentleman presented with hemodynamically significant GI bleed in the setting of recently initiated anticoagulation therapy for new onset atrial fibrillation. Anticoagulation therapy reversed with Kcentra at admission. Patient has received 2 units of packed blood cells. Area of pneumatosis in the small bowel seen on prior CT, no evidence of aortoenteric fistula. He does have significant mesenteric atherosclerosis.  Ileocolonoscopy showed marked inflammatory changes with ulceration involving most of the more proximal colon with more extreme inflammatory changes extending up into the terminal ileum. Status post biopsies. Upper endoscopy showed  markedly abnormal stomach and duodenum with geographic ulceration and erosions in the stomach, mucosa edematous with marked friability. Inflammatory changes extended down into the fourth portion of duodenum became more pronounced with ulceration distally.   Significant leukocytosis and  recent pneumatosis noted on prior CT-now not evident on current CT. C. difficile antigen positive in the setting of diarrhea following hospitalization and antibiotic exposure. Vancomycin started (no colitis on CT).Leukocytosis could be due to C. difficile or urinary tract infection in the setting of the right obstructive uropathy. Leukocytosis continues to improve.  CT Angiogram performed demonstrating significant atherosclerotic disease with gut perfusion primarily from collateral circulation. IR consulted and feel he would benefit from angiogram and intervention but would prefer to be out of ICU, unless changes in presentation such as onset of abdominal pain.  Patient continues to deny abdominal pain. No further bleeding noted. VSS stable. Afebrile. Has transitioned to oral amiodarone for over 48 hours. Discussed with Dr. Annamaria Boots, IR yesterday who requested patient be transferred to [facility] today in preparation for his procedure tomorrow.   Plan: 1. Discussed with Dr.Hawkins and patient/wife. Patient will be transferred to [facility] today in preparation for his arteriogram which is scheduled for Monday.   Laureen Ochs. Bernarda Caffey [facility] [phone] 6/4/201710:19 AM     LOS: 7 days

## 2016-02-02 ENCOUNTER — Encounter (HOSPITAL_COMMUNITY): Payer: Self-pay | Admitting: Internal Medicine

## 2016-02-02 ENCOUNTER — Inpatient Hospital Stay (HOSPITAL_COMMUNITY): Payer: Commercial Managed Care - HMO

## 2016-02-02 DIAGNOSIS — K55059 Acute (reversible) ischemia of intestine, part and extent unspecified: Secondary | ICD-10-CM

## 2016-02-02 DIAGNOSIS — L8990 Pressure ulcer of unspecified site, unspecified stage: Secondary | ICD-10-CM | POA: Insufficient documentation

## 2016-02-02 LAB — BASIC METABOLIC PANEL
Anion gap: 4 — ABNORMAL LOW (ref 5–15)
CHLORIDE: 102 mmol/L (ref 101–111)
CO2: 32 mmol/L (ref 22–32)
Calcium: 8.1 mg/dL — ABNORMAL LOW (ref 8.9–10.3)
Creatinine, Ser: 0.48 mg/dL — ABNORMAL LOW (ref 0.61–1.24)
GFR calc Af Amer: >60 mL/min (ref >60)
GFR calc non Af Amer: >60 mL/min (ref >60)
GLUCOSE: 90 mg/dL (ref 65–99)
POTASSIUM: 5.3 mmol/L — AB (ref 3.5–5.1)
Sodium: 138 mmol/L (ref 135–145)

## 2016-02-02 LAB — PROTIME-INR
INR: 1.34 (ref 0.00–1.49)
Prothrombin Time: 16.7 seconds — ABNORMAL HIGH (ref 11.6–15.2)

## 2016-02-02 LAB — CBC
HCT: 28.5 % — ABNORMAL LOW (ref 39.0–52.0)
Hemoglobin: 8.6 g/dL — ABNORMAL LOW (ref 13.0–17.0)
MCH: 25.8 pg — AB (ref 26.0–34.0)
MCHC: 30.2 g/dL (ref 30.0–36.0)
MCV: 85.6 fL (ref 78.0–100.0)
PLATELETS: 223 10*3/uL (ref 150–400)
RBC: 3.33 MIL/uL — ABNORMAL LOW (ref 4.22–5.81)
RDW: 15.7 % — AB (ref 11.5–15.5)
WBC: 10.4 10*3/uL (ref 4.0–10.5)

## 2016-02-02 LAB — APTT: APTT: 33 s (ref 24–37)

## 2016-02-02 MED ORDER — TAMSULOSIN HCL 0.4 MG PO CAPS
0.4000 mg | ORAL_CAPSULE | Freq: Every day | ORAL | Status: DC
  Administered 2016-02-02 – 2016-02-03 (×2): 0.4 mg via ORAL
  Filled 2016-02-02 (×2): qty 1

## 2016-02-02 MED ORDER — HEPARIN (PORCINE) IN NACL 100-0.45 UNIT/ML-% IJ SOLN
1250.0000 [IU]/h | INTRAMUSCULAR | Status: DC
  Administered 2016-02-02: 800 [IU]/h via INTRAVENOUS
  Administered 2016-02-04: 1100 [IU]/h via INTRAVENOUS
  Filled 2016-02-02 (×2): qty 250

## 2016-02-02 MED ORDER — ENSURE ENLIVE PO LIQD
237.0000 mL | Freq: Two times a day (BID) | ORAL | Status: DC
  Administered 2016-02-02 – 2016-02-03 (×2): 237 mL via ORAL

## 2016-02-02 MED ORDER — IOPAMIDOL (ISOVUE-300) INJECTION 61%
INTRAVENOUS | Status: AC
  Filled 2016-02-02: qty 100

## 2016-02-02 MED ORDER — LIDOCAINE HCL 1 % IJ SOLN
INTRAMUSCULAR | Status: AC
  Filled 2016-02-02: qty 20

## 2016-02-02 NOTE — Consult Note (Signed)
   [facility] Inpatient Consult   02/02/2016  Steve Gomez 29-Oct-1940 [id number]   [name] recently signed up with [facility] [employer] program. Made aware of hospital admission. Went to room to speak with patient and wife. However, the surgeon was at bedside speaking with them. Will come back at a later time. Made inpatient RNCM aware that [name] is active with [facility] [employer] program. Please see chart review tab then notes for further patient outreach details by [facility]. Will continue to follow along.   Marthenia Rolling, MSN-Ed, RN,BSN [facility] Liaison [phone]

## 2016-02-02 NOTE — Consult Note (Signed)
Chief Complaint: Patient was seen in consultation today for mesenteric arteriogram with possible angioplasty/stent placement Chief Complaint  Patient presents with  . Diarrhea   at the request of Dr Velvet Bathe  Referring Physician(s): Dr Velvet Bathe Dr Janece Canterbury  Supervising Physician: Arne Cleveland  Patient Status: In-pt  History of Present Illness: Steve Gomez is a 75 y.o. male   Xarelto after recent dx Atrial fib New dark stools and presented to ED at [facility]  Anticoagulation reversed and NO further bleeding Abdominal pain; N/V Work up worrisome for mesenteric ischemia CTA 6/2:  VASCULAR  1. Severe and largely heavily calcified vascular disease with high-grade stenosis versus occlusion of the celiac artery, occlusion of the origin of the superior mesenteric artery and surgical ligation of the inferior mesenteric artery. These findings are highly consistent with a chronic mesenteric ischemia affecting the entirety of the gastrointestinal tract. Nearly all flow to the gastrointestinal tract may be via collateral pathways. 2. Surgical changes of prior infrarenal abdominal aortic repair with aortic tube graft extending from the infrarenal aorta to just proximal to the aortic bifurcation. 3. Focal moderate calcified stenosis of the infrarenal abdominal aorta in the region of the proximal tube graft anastomosis. 4. Focal high-grade calcified stenoses of the bilateral common iliac artery origins followed by poststenotic dilatation as described above. 5. Bulky calcified atherosclerotic plaque in both common femoral arteries resulting in mild to moderate stenosis. 6. Primarily fibro fatty plaque results in a mild narrowing of the right renal artery origin. 7. Calcified plaque results in moderate narrowing of the left renal artery origin.  Now scheduled for mesenteric arteriogram with possible intervention per Dr Luan Pulling request  Past Medical History    Diagnosis Date  . COPD (chronic obstructive pulmonary disease) ([place])   . Hypertension   . High cholesterol   . S/P AAA repair   . Renal calculi 01/14/2016  . New onset atrial fibrillation ([place])   . Bladder outlet obstruction 01/14/2016  . Atherosclerosis   . Tobacco abuse     Past Surgical History  Procedure Laterality Date  . Cholecystectomy    . Abdominal aortic aneurysm repair      Allergies: Review of patient's allergies indicates no known allergies.  Medications: Prior to Admission medications   Medication Sig Start Date End Date Taking? Authorizing Provider  acetaminophen (TYLENOL) 500 MG tablet Take 500 mg by mouth every 6 (six) hours as needed for mild pain or moderate pain.   Yes Historical Provider, MD  aspirin EC 81 MG tablet Take 81 mg by mouth daily.   Yes Historical Provider, MD  digoxin (LANOXIN) 0.125 MG tablet Take 1 tablet (0.125 mg total) by mouth daily. 01/17/16  Yes Venetia Maxon Rama, MD  Multiple Vitamins-Minerals (MULTIVITAMINS THER. W/MINERALS) TABS tablet Take 1 tablet by mouth daily. 11/30/14  Yes Historical Provider, MD  OXYGEN Inhale 2 L into the lungs at bedtime.   Yes Historical Provider, MD  propranolol (INDERAL) 20 MG tablet Take 1.5 tablets (30 mg total) by mouth 3 (three) times daily. 01/21/16  Yes Imogene Burn, PA-C  rivaroxaban (XARELTO) 20 MG TABS tablet Take 1 tablet (20 mg total) by mouth daily with supper. 01/17/16  Yes Christina P Rama, MD  simvastatin (ZOCOR) 40 MG tablet Take 1 tablet by mouth daily. 12/01/14  Yes Historical Provider, MD  tamsulosin (FLOMAX) 0.4 MG CAPS capsule Take 1 capsule (0.4 mg total) by mouth daily. 01/17/16  Yes Venetia Maxon Rama, MD  nicotine (NICODERM CQ -  DOSED IN MG/24 HOURS) 14 mg/24hr patch Place 1 patch (14 mg total) onto the skin daily. Patient not taking: Reported on 01/25/2016 01/17/16   Venetia Maxon Rama, MD     Family History  Problem Relation Age of Onset  . Suicidality Father     Deceased   . Diabetes  Brother   . Diabetes Brother   . Diabetes Brother     Social History   Social History  . Marital Status: Married    Spouse Name: N/A  . Number of Children: N/A  . Years of Education: N/A   Social History Main Topics  . Smoking status: Current Every Day Smoker -- 1.00 packs/day    Types: Cigarettes    Start date: 08/30/1960  . Smokeless tobacco: None  . Alcohol Use: 0.0 oz/week    0 Standard drinks or equivalent per week     Comment: "just a couple of swallows of wine every night"  . Drug Use: No  . Sexual Activity: Not Asked   Other Topics Concern  . None   Social History Narrative     Review of Systems: A 12 point ROS discussed and pertinent positives are indicated in the HPI above.  All other systems are negative.  Review of Systems  Constitutional: Positive for activity change, appetite change, fatigue and unexpected weight change. Negative for fever.  HENT: Positive for hearing loss.   Respiratory: Negative for cough and shortness of breath.   Gastrointestinal: Positive for nausea, vomiting and abdominal pain.  Neurological: Positive for weakness.  Psychiatric/Behavioral: Negative for behavioral problems and confusion.    Vital Signs: BP 127/63 mmHg  Pulse 75  Temp(Src) 98.7 F (37.1 C) (Oral)  Resp 17  Ht 5\' 4"  (1.626 m)  Wt 114 lb 3.2 oz (51.801 kg)  BMI 19.59 kg/m2  SpO2 99%  Physical Exam  Constitutional: He is oriented to person, place, and time.  Cardiovascular: Normal rate and regular rhythm.   Pulmonary/Chest: Effort normal and breath sounds normal. He has no wheezes.  Abdominal: Soft. There is tenderness.  Musculoskeletal: Normal range of motion.  Neurological: He is alert and oriented to person, place, and time.  Skin: Skin is warm and dry.  Psychiatric: He has a normal mood and affect. His behavior is normal. Judgment and thought content normal.  Nursing note and vitals reviewed.   Mallampati Score:  MD Evaluation Airway: WNL Heart:  WNL Abdomen: WNL Chest/ Lungs: WNL ASA  Classification: 3 Mallampati/Airway Score: One  Imaging: Ct Abdomen Pelvis W Wo Contrast  01/25/2016  CLINICAL DATA:  GI bleeding, prior portal venous gas on previous CT EXAM: CT ABDOMEN AND PELVIS WITHOUT AND WITH CONTRAST TECHNIQUE: Multidetector CT imaging of the abdomen and pelvis was performed following the standard protocol before and following the bolus administration of intravenous contrast. CONTRAST:  151mL ISOVUE-300 IOPAMIDOL (ISOVUE-300) INJECTION 61% COMPARISON:  01/11/2016 FINDINGS: Lung bases are free of acute infiltrate or sizable effusion. Gallbladder has been surgically removed. The previously seen portal venous air within the left lobe of the liver is no longer identified. Portal vein is well visualized and demonstrates a normal branching pattern. No portal venous thrombosis is noted. Again no portal venous air is seen. The spleen, right adrenal gland and pancreas are stable in appearance from the prior exam. Small duodenal diverticulum is seen. Left adrenal gland demonstrates some hypertrophy but stable from the prior study. The kidneys are stable in appearance with cystic change and nonobstructing renal calculi bilaterally. The bladder is  decompressed by Foley catheter. Prostatic calcifications are seen. No free pelvic fluid is noted. Mild diverticular change is noted without evidence of diverticulitis. No pooling of contrast to correspond with the given clinical history of GI bleeding is noted. Aortoiliac calcifications are seen. Tortuosity of the abdominal aorta is noted. Tube graft is noted within the distal aorta related to prior surgical repair. This is stable from the prior exam. The appendix is not well visualized although no inflammatory changes are seen. The osseous structures show a scoliosis of the lumbar spine concave to the left. This is stable from the prior exam. No acute bony abnormality is noted. IMPRESSION: Resolution of  previously seen portal venous gas. No findings to correspond with the patient's given clinical history of active GI bleeding are seen. Direct visualization may be helpful. Nuclear bleeding scan may also be helpful for localization Remainder of the exam is stable from the prior study with evidence of bilateral renal calculi and right renal cystic change without obstructive change. Chronic changes as described. Electronically Signed   By: Inez Catalina M.D.   On: 01/25/2016 19:48   Ct Abdomen Pelvis W Contrast  01/11/2016  ADDENDUM REPORT: 01/11/2016 22:37 ADDENDUM: These results were called by telephone at the time of interpretation on <CurrentDate> at <CurrentTime> to Dr. Shanon Brow , who verbally acknowledged these results. Electronically Signed   By: Anner Crete M.D.   On: 01/11/2016 22:37  01/11/2016  CLINICAL DATA:  75 year old male with bilateral lower quadrant abdominal pain. EXAM: CT ABDOMEN AND PELVIS WITH CONTRAST TECHNIQUE: Multidetector CT imaging of the abdomen and pelvis was performed using the standard protocol following bolus administration of intravenous contrast. CONTRAST:  179mL ISOVUE-300 IOPAMIDOL (ISOVUE-300) INJECTION 61% COMPARISON:  None. FINDINGS: The visualized lung bases are clear. There is coronary vascular calcification. No intra-abdominal free air or free fluid. Cholecystectomy. Linear and branching air noted within the peripheral left lobe of the liver concerning for of the portal venous gas. The liver is otherwise unremarkable. The pancreas, spleen, and the right adrenal gland appear unremarkable. There is mild thickening of the left adrenal gland. Multiple small nonobstructing bilateral renal calculi measuring up to 4 mm. There is mild fullness of the renal collecting systems bilaterally. There is a 5 mm calculus in the proximal right ureter. There is a 2.6 cm exophytic hypodense lesion arising from the posterior cortex of the right kidney most compatible with a cyst. The urinary  bladder is distended. There is trabecular appearance of the bladder wall compatible with chronic bladder outlet obstruction. The prostate gland is mildly enlarged and measures 4.5 cm in transverse diameter. There is apparent thickening of the distal stomach in the region of the antrum and pylorus which may be related to underdistention or represent gastritis. Clinical correlation is recommended. Multiple small duodenal diverticula without definite active inflammation. There is no evidence of bowel obstruction. The visualized appendix appears unremarkable. There is aortoiliac atherosclerotic disease. There is focal area of advanced atherosclerotic plaque with narrowing of the aorta just inferior to the origins of the renal arteries. The aorta is tortuous. There is atherosclerotic calcifications of the origins of the celiac axis and SMA. The origins the celiac axis, SMA appear patent. The SMV, and main portal vein are patent. No portal venous gas identified. There is no adenopathy. Set the abdominal wall soft tissues appear unremarkable. There is osteopenia with degenerative changes of the spine. No acute fracture. IMPRESSION: Linear branching air in the left lobe of the liver concerning for portal venous  gas. This is of indeterminate etiology, however the possibility of ischemic bowel is not excluded. Clinical correlation is recommended. Underdistention of the stomach versus gastritis. No evidence of bowel obstruction. No CT evidence for acute appendicitis. A 5 mm proximal right ureteral calculus with small nonobstructing bilateral renal calculi. Mild fullness of the renal collecting systems bilaterally. Trabecular appearance of the urinary bladder compatible with chronic bladder outlet obstruction. Electronically Signed: By: Anner Crete M.D. On: 01/11/2016 22:26   Dg Chest Port 1 View  01/13/2016  CLINICAL DATA:  R06.02 (ICD-10-CM) - SOB (shortness of breath)COPD (chronic obstructive pulmonary disease) ([place])  J44.9Smokes 1ppd/60 yrs EXAM: PORTABLE CHEST 1 VIEW COMPARISON:  Chest CT, 07/23/2015.  Chest radiograph, 02/06/2015. FINDINGS: There is new opacity at the right lung base when compared to the prior studies silhouetting the right hemidiaphragm. Mild opacities noted in the medial left lung base. Remainder of the lungs is clear. Left lung is hyperexpanded. No pneumothorax. Cardiac silhouette is normal in size. No mediastinal or hilar masses or convincing adenopathy. Bony thorax is demineralized but grossly intact. IMPRESSION: 1. New bilateral lung base opacity, greater on the right. This may be atelectasis, pneumonia or a combination. A central obstructing lesion is not excluded. Consider followup chest CT with contrast for further assessment. 2. No evidence of pulmonary edema. Electronically Signed   By: Lajean Manes M.D.   On: 01/13/2016 11:21   Dg Abd Acute W/chest  01/25/2016  CLINICAL DATA:  Diarrhea.  Abdominal pain.  Weakness. EXAM: DG ABDOMEN ACUTE W/ 1V CHEST COMPARISON:  01/13/2016. FINDINGS: Borderline enlarged cardiac silhouette. Clear lungs. Normal bowel gas pattern without free peritoneal air. Cholecystectomy clips. 6 mm nonspecific oval calcification overlying the right lower abdomen medially. Marked dextroconvex lumbar rotary scoliosis and degenerative changes. Mild to moderate thoracic scoliosis and degenerative changes. Mild left shoulder degenerative changes. Diffuse osteopenia. Atheromatous arterial calcifications. IMPRESSION: 1. No acute abnormality. 2. 6 mm oval calcification overlying the right lower abdomen, medially. This could potentially represent a ureteral calculus or phlebolith. An appendicolith is less likely. 3. Thoracolumbar scoliosis and degenerative changes. Electronically Signed   By: Claudie Revering M.D.   On: 01/25/2016 10:18   Ct Angio Abd/pel W/ And/or W/o  01/30/2016  ADDENDUM REPORT: 01/30/2016 14:55 ADDENDUM: 5 x 6 mm ureteral stone on the right just above the right iliac  artery causing moderate hydronephrosis of the right kidney. Additional small bilateral renal calculi bilaterally. On the CT of 01/25/2016, the stone was in the proximal right ureter not causing significant obstruction. Electronically Signed   By: Franchot Gallo M.D.   On: 01/30/2016 14:55  01/30/2016  CLINICAL DATA:  75 year old male with generalized abdominal pain and recent GI bleed. Evaluate for mesenteric ischemia. EXAM: CTA ABDOMEN AND PELVIS wITHOUT AND WITH CONTRAST TECHNIQUE: Multidetector CT imaging of the abdomen and pelvis was performed using the standard protocol during bolus administration of intravenous contrast. Multiplanar reconstructed images and MIPs were obtained and reviewed to evaluate the vascular anatomy. CONTRAST:  100 mL Isovue 370 COMPARISON:  Prior CTA abdomen and pelvis 01/25/2016 FINDINGS: VASCULAR Aorta: Surgical changes of prior tube graft repair of reported abdominal aortic aneurysm. There is focal narrowing of the aortic lumen in the region of the proximal anastomosis. The aortic lumen measures only 12 mm at this location in there is extensive peripheral calcification. No evidence of aneurysm or pseudoaneurysm. Celiac: Heavily diseased origin the celiac artery secondary to bulky calcified atherosclerotic plaque. There is either high-grade stenosis or focal occlusion of the  origin of the celiac artery. The splenic artery is relatively small in caliber. Conventional hepatic arterial anatomy. SMA: Approximately 1.5 cm occlusion of the origin of the superior mesenteric artery. There is bulky calcified plaque surrounding the origin. Renals: Solitary bilateral renal arteries. Predominately calcified plaque results in at least moderate stenosis of the left renal artery origin. Heterogeneous fibro fatty and calcified plaque results in mild narrowing of the right renal artery origin. No renal artery aneurysm or fibromuscular dysplasia. IMA: The origin has been ligated. The distal branches  opacified via retrograde flow. Inflow: Relatively high-grade focal stenoses of the most proximal bilateral iliac arteries secondary to bulky calcified plaque. Mild and possibly poststenotic aneurysmal dilatation of the iliac arteries measuring up to 1.9 cm on the right and 1.6 cm on the left. The bilateral internal and external iliac arteries remain patent. Proximal Outflow: Bulky calcified atherosclerotic plaque along the posterior wall of the common femoral arteries bilaterally. The visualized proximal profunda and superficial femoral arteries remain patent. Veins: No focal venous abnormality. NON-VASCULAR Lower Chest: Small bilateral layering pleural effusions. There is associated lower lobe atelectasis mild lower lobe bronchial wall thickening. Cardiomegaly. Atherosclerotic calcifications noted along the visualized coronary arteries. No pericardial effusion. Unremarkable distal thoracic esophagus. Abdomen: Unremarkable CT appearance of the pancreas, spleen, and adrenal glands for age. Grossly unremarkable stomach and duodenum. No evidence of obstruction or focal bowel wall thickening. No focal pneumatosis or free air. Normal hepatic contour and morphology. No discrete hepatic lesions. The gallbladder is surgically absent. No intra or extrahepatic biliary ductal dilatation. Mild periportal edema. No evidence of portal venous gas. Diffuse mild mesenteric edema.  Trace free fluid. Pelvis: Foley catheter is noted within the bladder. The bladder wall appears somewhat thickened although evaluation is limited by underdistention. Bones/Soft Tissues: Mild anasarca. No acute fracture or aggressive appearing lytic or blastic osseous lesion. Degenerative dextro convex scoliosis centered at L1-L2. Review of the MIP images confirms the above findings. IMPRESSION: VASCULAR 1. Severe and largely heavily calcified vascular disease with high-grade stenosis versus occlusion of the celiac artery, occlusion of the origin of the  superior mesenteric artery and surgical ligation of the inferior mesenteric artery. These findings are highly consistent with a chronic mesenteric ischemia affecting the entirety of the gastrointestinal tract. Nearly all flow to the gastrointestinal tract may be via collateral pathways. 2. Surgical changes of prior infrarenal abdominal aortic repair with aortic tube graft extending from the infrarenal aorta to just proximal to the aortic bifurcation. 3. Focal moderate calcified stenosis of the infrarenal abdominal aorta in the region of the proximal tube graft anastomosis. 4. Focal high-grade calcified stenoses of the bilateral common iliac artery origins followed by poststenotic dilatation as described above. 5. Bulky calcified atherosclerotic plaque in both common femoral arteries resulting in mild to moderate stenosis. 6. Primarily fibro fatty plaque results in a mild narrowing of the right renal artery origin. 7. Calcified plaque results in moderate narrowing of the left renal artery origin. NON VASCULAR 1. No evidence of pneumatosis, portal venous air, or free air. 2. No evidence of obstruction or focal bowel wall thickening. 3. Bilateral pleural effusions, periportal edema, anasarca, small volume ascites and mesenteric edema all consistent with volume overload versus CHF or third-spacing. 4. Bilateral lower lobe atelectasis and bronchial wall thickening. 5. Additional ancillary findings as above without significant interval change. Signed, Criselda Peaches, MD [facility] Electronically Signed: By: Jacqulynn Cadet M.D. On: 01/29/2016 11:35    Labs:  CBC:  Recent Labs  01/30/16 0431 01/31/16 0351 02/01/16 0555 02/02/16 0400  WBC 12.6* 11.1* 11.0* 10.4  HGB 8.2* 8.3* 8.5* 8.6*  HCT 26.2* 26.5* 27.2* 28.5*  PLT 233 228 226 223    COAGS:  Recent Labs  01/11/16 1933 01/25/16 1215 02/02/16 0400  INR 1.24 2.40* 1.34  APTT 32 36  33    BMP:  Recent Labs  01/30/16 0431 01/31/16 0351 02/01/16 0555 02/02/16 0400  NA 139 140 138 138  K 2.8* 3.0* 3.4* 5.3*  CL 108 104 100* 102  CO2 27 31 33* 32  GLUCOSE 90 85 91 90  BUN <5* <5* <5* <5*  CALCIUM 7.3* 7.2* 7.5* 8.1*  CREATININE 0.38* 0.42* 0.42* 0.48*  GFRNONAA >60 >60 >60 >60  GFRAA >60 >60 >60 >60    LIVER FUNCTION TESTS:  Recent Labs  01/11/16 1933 01/25/16 0850 01/26/16 0443 01/27/16  BILITOT 0.5 0.4 0.6 0.8  AST 20 29 21 24   ALT 12* 23 17 18   ALKPHOS 124 109 97 82  PROT 6.9 5.5* 4.8* 4.6*  ALBUMIN 3.5 2.5* 2.3* 2.1*    TUMOR MARKERS: No results for input(s): AFPTM, CEA, CA199, CHROMGRNA in the last 8760 hours.  Assessment and Plan:  New GI bleed after Start Xarelto for Afib Anticoagulation reversed 5/28--- no bleed since Continued worsening abd pain ; N/V Mesenteric ischemia on CTA 6/2 Now scheduled for mesenteric arteriogram with possible angioplasty/stnet placement Risks and Benefits discussed with the patient including, but not limited to bleeding, infection, vascular injury or contrast induced renal failure. All of the patient's questions were answered, patient is agreeable to proceed. Consent signed and in chart.  Thank you for this interesting consult.  I greatly enjoyed meeting Drue Huizinga and look forward to participating in their care.  A copy of this report was sent to the requesting provider on this date.  Electronically Signed: Monia Sabal A 02/02/2016, 8:21 AM   I spent a total of 40 Minutes    in face to face in clinical consultation, greater than 50% of which was counseling/coordinating care for mesenteric arteriogram with poss intervention

## 2016-02-02 NOTE — Progress Notes (Signed)
Nutrition Follow-up  DOCUMENTATION CODES:   Severe malnutrition in context of chronic illness, Underweight  INTERVENTION:  Continue Ensure Enlive po BID, each supplement provides 350 kcal and 20 grams of protein.  Encourage adequate PO intake.   RD to continue to monitor.   NUTRITION DIAGNOSIS:   Malnutrition related to chronic illness as evidenced by severe depletion of body fat, severe depletion of muscle mass, percent weight loss (15% in 6 months); ongoing  GOAL:   Patient will meet greater than or equal to 90% of their needs; progressing  MONITOR:   PO intake, Supplement acceptance, Diet advancement, Labs, Weight trends, Skin, I & O's  REASON FOR ASSESSMENT:   Malnutrition Screening Tool    ASSESSMENT:   Pt  with medical history significant of COPD, hypertension, hypercholesterolemia, recently diagnosed atrial fibrillation on her alto who presents to the emergency department with complaints of dark looking diarrhea. Patient was recently discharged from [facility] on [facility]/17/2016, admitted for workup of of abdominal pain with unclear etiology. Patient was discharged home on by mouth Ceftin. During this time, patient was also found to have new onset atrial fibrillation improved with beta blocker. Patient was transitioned from heparin to Xarelto on 01/16/2016. Pt transferred from [facility].  Pt is currently on a full liquid diet. Pt had not received his meals yet during time of visit. Previous meal completion has been 50-75%. Pt reports no abdominal pains and hunger during visit. Pt currently has Ensure ordered and has been consuming them. RD to continue with current orders.   Labs and medications reviewed.   Diet Order:  Diet full liquid Room service appropriate?: Yes; Fluid consistency:: Thin  Skin:  Wound (see comment) (Stage I pressure ulcer on buttocks)  Last BM:  6/4  Height:   Ht Readings from Last 1 Encounters:  02/01/16 5\' 4"  (1.626 m)     Weight:   Wt Readings from Last 1 Encounters:  02/01/16 114 lb 3.2 oz (51.801 kg)    Ideal Body Weight:  59 kg  BMI:  Body mass index is 19.59 kg/(m^2).  Estimated Nutritional Needs:   Kcal:  1600-1800  Protein:  70-80 g  Fluid:  1.6 - 1.8 L/day  EDUCATION NEEDS:   No education needs identified at this time  Corrin Parker, MS, RD, LDN Pager # [phone] After hours/ weekend pager # [phone]

## 2016-02-02 NOTE — Progress Notes (Addendum)
[facility] NOTE   MRN : [id number]  Reason for Consult: mesenteric ischemia Referring Physician: Dr. Sheran Fava  History of Present Illness: 75 y/o male admitted to [facility] with chief complaints of abdominal pain and rectal bleeding.  He has a history of AAA repair 20 years ago, COPD, Hypertension,  And A fib recently started on Xarelto 01/16/2016.   He reports he has lost 38-70 bls in the past 2 years.  Up until 01/25/2016 he was eating what ever he wanted, he then got sudden abdominal pain while eating a sandwich and had a bloody BM.  He was noted to have a hemoglobin of 7.4 at the time of admission.  He was recently discharged from [facility] on 01/17/2016, admitted for workup of of abdominal pain with unclear etiology. Patient was discharged home on by mouth Ceftin.      Current Facility-Administered Medications  Medication Dose Route Frequency Provider Last Rate Last Dose  . 0.9 %  sodium chloride infusion   Intravenous Continuous Donne Hazel, MD 100 mL/hr at 02/01/16 2307    . acetaminophen (TYLENOL) tablet 650 mg  650 mg Oral Q6H PRN Donne Hazel, MD       Or  . acetaminophen (TYLENOL) suppository 650 mg  650 mg Rectal Q6H PRN Donne Hazel, MD      . amiodarone (PACERONE) tablet 400 mg  400 mg Oral BID Arnoldo Lenis, MD   400 mg at 02/02/16 1114  . antiseptic oral rinse (CPC / CETYLPYRIDINIUM CHLORIDE 0.05%) solution 7 mL  7 mL Mouth Rinse BID Sinda Du, MD   7 mL at 02/02/16 1120  . feeding supplement (ENSURE ENLIVE) (ENSURE ENLIVE) liquid 237 mL  237 mL Oral BID BM Janece Canterbury, MD   237 mL at 02/02/16 1121  . magnesium oxide (MAG-OX) tablet 200 mg  200 mg Oral BID Lendon Colonel, NP   200 mg at 02/02/16 1115  . morphine 2 MG/ML injection 2 mg  2 mg Intravenous Q4H PRN Donne Hazel, MD      . nicotine (NICODERM CQ - dosed in mg/24 hours) patch 21 mg  21 mg Transdermal Daily Sinda Du, MD   21 mg at 02/02/16 1118  .  ondansetron (ZOFRAN) tablet 4 mg  4 mg Oral Q6H PRN Donne Hazel, MD       Or  . ondansetron [facility]) injection 4 mg  4 mg Intravenous Q6H PRN Donne Hazel, MD      . sodium chloride flush (NS) 0.9 % injection 3 mL  3 mL Intravenous Q12H Donne Hazel, MD   3 mL at 02/01/16 1000  . tamsulosin (FLOMAX) capsule 0.4 mg  0.4 mg Oral QPC supper Janece Canterbury, MD        Pt meds include: Statin :Yes Betablocker: Yes ASA: Yes Other anticoagulants/antiplatelets: none currently  Past Medical History  Diagnosis Date  . COPD (chronic obstructive pulmonary disease) ([place])   . Hypertension   . High cholesterol   . S/P AAA repair   . Renal calculi 01/14/2016  . New onset atrial fibrillation ([place])   . Bladder outlet obstruction 01/14/2016  . Atherosclerosis   . Tobacco abuse     Past Surgical History  Procedure Laterality Date  . Cholecystectomy    . Abdominal aortic aneurysm repair      Social History Social History  Substance Use Topics  . Smoking status: Current Every Day Smoker -- 1.00  packs/day    Types: Cigarettes    Start date: 08/30/1960  . Smokeless tobacco: None  . Alcohol Use: 0.0 oz/week    0 Standard drinks or equivalent per week     Comment: "just a couple of swallows of wine every night"    Family History Family History  Problem Relation Age of Onset  . Suicidality Father     Deceased   . Diabetes Brother   . Diabetes Brother   . Diabetes Brother     No Known Allergies   REVIEW OF SYSTEMS  General: [ ]  Weight loss, [ ]  Fever, [ ]  chills Neurologic: [ ]  Dizziness, [ ]  Blackouts, [ ]  Seizure [ ]  Stroke, [ ]  "Mini stroke", [ ]  Slurred speech, [ ]  Temporary blindness; [ ]  weakness in arms or legs, [ ]  Hoarseness [ ]  Dysphagia Cardiac: [ ]  Chest pain/pressure, [ ]  Shortness of breath at rest [ ]  Shortness of breath with exertion, [x ] Atrial fibrillation or irregular heartbeat  Vascular: [ ]  Pain in legs with walking, [ ]  Pain in legs at rest, [ ]  Pain in  legs at night,  [ ]  Non-healing ulcer, [ ]  Blood clot in vein/DVT,   Pulmonary: [ ]  Home oxygen, [ ]  Productive cough, [ ]  Coughing up blood, [ ]  Asthma,  [ ]  Wheezing [ ]  COPD Musculoskeletal:  [ ]  Arthritis, [ ]  Low back pain, [ ]  Joint pain Hematologic: [ ]  Easy Bruising, [ ]  Anemia; [ ]  Hepatitis Gastrointestinal: [x ] Blood in stool, [ ]  Gastroesophageal Reflux/heartburn, [x] post prandial pain Urinary: [ ]  chronic Kidney disease, [ ]  on HD - [ ]  MWF or [ ]  TTHS, [ ]  Burning with urination, [ ]  Difficulty urinating Skin: [ ]  Rashes, [ ]  Wounds Psychological: [ ]  Anxiety, [ ]  Depression  Physical Examination Filed Vitals:   02/02/16 0641 02/02/16 0743 02/02/16 0850 02/02/16 1012  BP: 128/62 127/63 140/84 121/64  Pulse: 77 75 84 70  Temp: 98.6 F (37 C) 98.7 F (37.1 C)  98.7 F (37.1 C)  TempSrc: Oral Oral  Oral  Resp: 16 17 24 18   Height:      Weight:      SpO2: 98% 99% 99% 99%   Body mass index is 19.59 kg/(m^2).  General:  WDWN in NAD  HENT: WNL Eyes: Pupils equal Pulmonary: normal non-labored breathing , without Rales, rhonchi,  wheezing Cardiac: RRR, without  Murmurs, rubs or gallops; No carotid bruits Abdomen: soft, NT, no masses.  Positive BS all 4 quadrants. Skin: no rashes, ulcers noted;  no Gangrene , no cellulitis; no open wounds;   Vascular Exam/Pulses:Palpable radial , femoral, DP/PT pulses bilatereally   Musculoskeletal: no muscle wasting or atrophy; no edema  Neurologic: A&O X 3; Appropriate Affect ;  SENSATION: normal; MOTOR FUNCTION: 5/5 Symmetric Speech is fluent/normal   Significant Diagnostic Studies: CBC Lab Results  Component Value Date   WBC 10.4 02/02/2016   HGB 8.6* 02/02/2016   HCT 28.5* 02/02/2016   MCV 85.6 02/02/2016   PLT 223 02/02/2016    BMET    Component Value Date/Time   NA 138 02/02/2016 0400   K 5.3* 02/02/2016 0400   CL 102 02/02/2016 0400   CO2 32 02/02/2016 0400   GLUCOSE 90 02/02/2016 0400   BUN <5*  02/02/2016 0400   CREATININE 0.48* 02/02/2016 0400   CALCIUM 8.1* 02/02/2016 0400   GFRNONAA >60 02/02/2016 0400   GFRAA >60 02/02/2016 0400   Estimated Creatinine  Clearance: 59.4 mL/min (by C-G formula based on Cr of 0.48).  COAG Lab Results  Component Value Date   INR 1.34 02/02/2016   INR 2.40* 01/25/2016   INR 1.24 01/11/2016     Non-Invasive Vascular Imaging:  IMPRESSION: VASCULAR  1. Severe and largely heavily calcified vascular disease with high-grade stenosis versus occlusion of the celiac artery, occlusion of the origin of the superior mesenteric artery and surgical ligation of the inferior mesenteric artery. These findings are highly consistent with a chronic mesenteric ischemia affecting the entirety of the gastrointestinal tract. Nearly all flow to the gastrointestinal tract may be via collateral pathways. 2. Surgical changes of prior infrarenal abdominal aortic repair with aortic tube graft extending from the infrarenal aorta to just proximal to the aortic bifurcation. 3. Focal moderate calcified stenosis of the infrarenal abdominal aorta in the region of the proximal tube graft anastomosis. 4. Focal high-grade calcified stenoses of the bilateral common iliac artery origins followed by poststenotic dilatation as described above. 5. Bulky calcified atherosclerotic plaque in both common femoral arteries resulting in mild to moderate stenosis. 6. Primarily fibro fatty plaque results in a mild narrowing of the right renal artery origin. 7. Calcified plaque results in moderate narrowing of the left renal artery origin.   ASSESSMENT/PLAN:   Mesenteric ischemia Post prandial pain for about a month on and off Currently on clear liquid diet, tolerating it well without pain He has positive BS in all four quadrants.   He has no reports of GI bleed since they stopped the Xarelto Dr. Donnetta Hutching will review the studies and make recommendations which may include SMA by  pass surgery.  Positive C diff   COLLINS, EMMA MAUREEN 02/02/2016 11:40 AM  I have examined the patient, reviewed and agree with above.Extremely complex patient. Prior history of what sounds like a ruptured abdominal aortic aneurysm treated in [place] at least 20 years ago. Had a aortic tube graft from below the renals to the aortic bifurcation. Denies any prior cardiac difficulty. Specifically no myocardial infarction and no history of congestive heart failure. Recent echocardiogram showed ejection fraction greater than 50%. Presented with the atrial fibrillation in mid May. The patient reports that for quite some time he does have abdominal pain after a large meal. Has lost a great deal of weight over the last 2 years. Has had worsening abdominal pain over the past several weeks. Underwent upper and lower endoscopy in [place] showing extensive ulceration in his stomach and small bowel and also throughout his colon. This was consistent with diffuse ischemia. He has had CT angiograms on May 14 on June 1 and one in between these 2. This did show extensive atherosclerotic disease. He has extreme calcification and stenosis at the origin of the celiac and superior mesenteric arteries. Inferior mesenteric artery was ligated at the time of his aneurysm repair. There was a significant change in his superior mesenteric flow between May 14 and June 1. The area of high-grade stenosis in the proximal superior mesenteric artery was thrombosed on the June 1 study.  I discussed this at length with the patient and his family present. Also reviewed his films with Dr. Vernard Gambles with interventional radiology. He'll that there is no possible option for endovascular treatment with ballooning or stenting due to the extensive calcification and occlusions of his origin. Do not feel that he can live with his level of ischemia with continued bloody stools and diffuse ischemia throughout his intestinal tract. There was  only option would be  aorta SMA bypass. Explained the magnitude of the procedure especially with his prior aneurysm repair. Explained the expected prolonged hospitalization in intensive care time and also prolonged recovery. Patient understands we will schedule this for Wednesday. Will discuss further tomorrow  Curt Jews, MD 02/02/2016 4:48 PM

## 2016-02-02 NOTE — Progress Notes (Signed)
PROGRESS NOTE  Steve Gomez  [id number] DOB: 1941-01-30 DOA: 01/25/2016 PCP: Alonza Bogus, MD  Brief Narrative:   Steve Gomez 75 year old male with a pmh of HTN, HLD, COPD, tobacco abuse, AAA s/p repair, and recently hospitaslized 5/14- 5/20 diagnosed afib placed on Xarelto; who initially presented to [facility] with complaints of rectal bleeding on 5/28. Initial hemoglobin was noted to be 7.4, transfused 2 units of PRBC. CT scan obtained on 6/1 showed significant signs of vascular disease of the mesenteric arteries with a new occlusion of the SMA. Evaluated by surgery, GI, Cardiology, and IR. Recommended the patient be transferred to [facility] called for further workup by interventional radiology.  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  Due to presence of multi-vessel disease and previous abdominal aortic graft, IR did not perform intervention and requested vascular surgery consultation.  Plan for OR for high risk procedure on 02/04/2016 by Dr. Donnetta Hutching.    Assessment & Plan:   Principal Problem:   Acute blood loss anemia Active Problems:   Atrial fibrillation with RVR (HCC)   COPD (chronic obstructive pulmonary disease) (HCC)   Hypertension   High cholesterol   Hypokalemia   Rectal bleeding   Protein-calorie malnutrition, severe   Diarrhea   Ischemic disease of gut (HCC)   Mesenteric ischemia (HCC)   Pressure ulcer   Acute blood loss anemia secondary to anticoagulation in the setting of acute on chronic mesenteric ischemia -  No bleeding in several days, hemoglobin stable -  Start heparin, stop if any evidence of bleeding -  Okay to advance to regular diet  -  NPO Tuesday night for surgery Wed AM  Paroxysmal atrial fibrillation, CHADs2vasc = 3  -  Xarelto stopped due to recent GIB -  Rate controlled with oral amiodraone:  400mg  BID x 7 days  through 6/8, then 200mg  BID x 14 days started on 6/9 and ending on 6/22, followed by 200mg  daily thereafter starting on 6/23  COPD/OSA -  Continue nightly CPAP -  Start incentive spirometry  Bladder outlet obstruction -  Continue foley catheter  Obstructing right ureteral stone 5-73mm causing moderate right hydronephrosis, asymptomatic -  Monitor for signs of UTI -  Trend creatinine -  Resume flomax  Severe protein calorie malnutrition -  Liberalize diet and add supplements   DVT prophylaxis:  Heparin gtt Code Status:  Full code Family Communication:  Patient, his wife and sister Disposition Plan:  Surgery Wednesday.  High risk surgery, disposition to SNF depending on post-operative course.    Consultants:   At [facility]:  Cardiology, Gastroenterology.    At [facility]:  IR, Vascular surgery  Procedures:  Abdominal angiography by IR  Antimicrobials:   none    Subjective:  Has been having abdominal pains for the last year after eating and has lost 15-lbs since last year.  Last BM was yesterday and did not have obvious blood.    Objective: Filed Vitals:   02/02/16 0743 02/02/16 0850 02/02/16 1012 02/02/16 1647  BP: 127/63 140/84 121/64 120/65  Pulse: 75 84 70   Temp: 98.7 F (37.1 C)  98.7 F (37.1 C) 98.3 F (36.8 C)  TempSrc: Oral  Oral Oral  Resp: 17 24 18 17   Height:      Weight:      SpO2: 99% 99% 99% 97%    Intake/Output Summary (Last 24 hours)  at 02/02/16 2009 Last data filed at 02/02/16 1649  Gross per 24 hour  Intake   2385 ml  Output   4775 ml  Net  -2390 ml   Filed Weights   01/31/16 0400 02/01/16 1224 02/01/16 1933  Weight: 53.5 kg (117 lb 15.1 oz) 51.483 kg (113 lb 8 oz) 51.801 kg (114 lb 3.2 oz)    Examination:  General exam:  Cachectic adult male.  No acute distress.  HEENT:  NCAT, MMM Respiratory system: Clear to auscultation bilaterally Cardiovascular system:  IRRR, normal S1/S2. No murmurs, rubs, gallops or clicks.  Warm  extremities Gastrointestinal system: Normal active bowel sounds, soft, nondistended, nontender.  scaphoid MSK:  Decreased tone and bulk, no lower extremity edema Neuro:  Grossly moves all extremities    Data Reviewed: I have personally reviewed following labs and imaging studies  CBC:  Recent Labs Lab 01/28/16 0859 01/29/16 0509 01/30/16 0431 01/31/16 0351 02/01/16 0555 02/02/16 0400  WBC 22.2* 16.1* 12.6* 11.1* 11.0* 10.4  NEUTROABS 19.5* 13.4* 10.0* 9.1* 8.6*  --   HGB 9.1* 8.2* 8.2* 8.3* 8.5* 8.6*  HCT 28.9* 26.6* 26.2* 26.5* 27.2* 28.5*  MCV 87.6 87.2 86.5 86.9 87.2 85.6  PLT 305 265 233 228 226 [id number]   Basic Metabolic Panel:  Recent Labs Lab 01/29/16 0509 01/30/16 0431 01/31/16 0351 02/01/16 0555 02/02/16 0400  NA 139 139 140 138 138  K 3.0* 2.8* 3.0* 3.4* 5.3*  CL 111 108 104 100* 102  CO2 25 27 31  33* 32  GLUCOSE 101* 90 85 91 90  BUN 8 <5* <5* <5* <5*  CREATININE 0.42* 0.38* 0.42* 0.42* 0.48*  CALCIUM 7.2* 7.3* 7.2* 7.5* 8.1*  MG  --   --  1.4*  --   --    GFR: Estimated Creatinine Clearance: 59.4 mL/min (by C-G formula based on Cr of 0.48). Liver Function Tests:  Recent Labs Lab 01/27/16  AST 24  ALT 18  ALKPHOS 82  BILITOT 0.8  PROT 4.6*  ALBUMIN 2.1*   No results for input(s): LIPASE, AMYLASE in the last 168 hours. No results for input(s): AMMONIA in the last 168 hours. Coagulation Profile:  Recent Labs Lab 02/02/16 0400  INR 1.34   Cardiac Enzymes: No results for input(s): CKTOTAL, CKMB, CKMBINDEX, TROPONINI in the last 168 hours. BNP (last 3 results) No results for input(s): PROBNP in the last 8760 hours. HbA1C: No results for input(s): HGBA1C in the last 72 hours. CBG:  Recent Labs Lab 01/27/16 0739 01/30/16 1537  GLUCAP 69 100*   Lipid Profile: No results for input(s): CHOL, HDL, LDLCALC, TRIG, CHOLHDL, LDLDIRECT in the last 72 hours. Thyroid Function Tests: No results for input(s): TSH, T4TOTAL, FREET4, T3FREE,  THYROIDAB in the last 72 hours. Anemia Panel: No results for input(s): VITAMINB12, FOLATE, FERRITIN, TIBC, IRON, RETICCTPCT in the last 72 hours. Urine analysis:    Component Value Date/Time   COLORURINE YELLOW 01/25/2016 0852   APPEARANCEUR HAZY* 01/25/2016 0852   LABSPEC 1.015 01/25/2016 0852   PHURINE 6.0 01/25/2016 0852   GLUCOSEU NEGATIVE 01/25/2016 0852   HGBUR LARGE* 01/25/2016 0852   BILIRUBINUR SMALL* 01/25/2016 0852   KETONESUR NEGATIVE 01/25/2016 0852   PROTEINUR 30* 01/25/2016 0852   NITRITE NEGATIVE 01/25/2016 0852   LEUKOCYTESUR TRACE* 01/25/2016 0852   Sepsis Labs: @LABRCNTIP (procalcitonin:4,lacticidven:4)  ) Recent Results (from the past 240 hour(s))  MRSA PCR Screening     Status: None   Collection Time: 01/25/16  1:40 PM  Result Value Ref Range  Status   MRSA by PCR NEGATIVE NEGATIVE Final    Comment:        The GeneXpert MRSA Assay (FDA approved for NASAL specimens only), is one component of a comprehensive MRSA colonization surveillance program. It is not intended to diagnose MRSA infection nor to guide or monitor treatment for MRSA infections.   C difficile quick scan w PCR reflex     Status: Abnormal   Collection Time: 01/25/16  2:00 PM  Result Value Ref Range Status   C Diff antigen POSITIVE (A) NEGATIVE Final   C Diff toxin NEGATIVE NEGATIVE Final   C Diff interpretation   Final    C. difficile present, but toxin not detected. This indicates colonization. In most cases, this does not require treatment. If patient has signs and symptoms consistent with colitis, consider treatment. Requires ENTERIC precautions.      Radiology Studies: No results found.   Scheduled Meds: . amiodarone  400 mg Oral BID  . antiseptic oral rinse  7 mL Mouth Rinse BID  . feeding supplement (ENSURE ENLIVE)  237 mL Oral BID BM  . magnesium oxide  200 mg Oral BID  . nicotine  21 mg Transdermal Daily  . sodium chloride flush  3 mL Intravenous Q12H  . tamsulosin   0.4 mg Oral QPC supper   Continuous Infusions: . sodium chloride 100 mL/hr at 02/02/16 1555     LOS: 8 days    Time spent: 30 min    Janece Canterbury, MD [facility] Pager [phone]  If 7PM-7AM, please contact night-coverage [email or web address] Password [email or web address] 02/02/2016, 8:09 PM

## 2016-02-02 NOTE — Evaluation (Signed)
Physical Therapy Evaluation Patient Details Name: Steve Gomez MRN: [id number] DOB: Dec 26, 1940 Today's Date: 02/02/2016   History of Present Illness  History of Present Illness: 75 y/o male admitted to [facility] with chief complaints of abdominal pain and rectal bleeding. He has a history of AAA repair 20 years ago, COPD, Hypertension, And A fib recently started on Xarelto 01/16/2016. He reports he has lost 55-70 bls in the past 2 years. Up until 01/25/2016 he was eating what ever he wanted, he then got sudden abdominal pain while eating a sandwich and had a bloody BM. He was noted to have a hemoglobin of 7.4 at the time of admission. He was recently discharged from [facility] on 01/17/2016, admitted for workup of of abdominal pain with unclear etiology Found to have mesenteric ischemia  Clinical Impression   Pt admitted with above diagnosis. Pt currently with functional limitations due to the deficits listed below (see PT Problem List).  Pt will benefit from skilled PT to increase their independence and safety with mobility to allow discharge to the venue listed below.       Follow Up Recommendations Home health PT    Equipment Recommendations  Rolling walker with 5" wheels;3in1 (PT)    Recommendations for Other Services OT consult     Precautions / Restrictions Precautions Precautions: Fall      Mobility  Bed Mobility Overal bed mobility: Needs Assistance Bed Mobility: Supine to Sit     Supine to sit: Supervision     General bed mobility comments: Cues to self-monitor for activitytolerance; used bed rail and did not need phsyical assist  Transfers Overall transfer level: Needs assistance Equipment used: Rolling walker (2 wheeled) Transfers: Sit to/from Stand Sit to Stand: Min guard         General transfer comment: Minguard for safety; cues for hand placment  Ambulation/Gait Ambulation/Gait assistance: Min guard Ambulation Distance (Feet): 80  Feet Assistive device: Rolling walker (2 wheeled) Gait Pattern/deviations: Step-through pattern;Decreased step length - right;Decreased step length - left     General Gait Details: Reported feeling weak and given less activity in past few weeks, we opted for RW for balance  Stairs            Wheelchair Mobility    Modified Rankin (Stroke Patients Only)       Balance             Standing balance-Leahy Scale: Poor                               Pertinent Vitals/Pain Pain Assessment: No/denies pain    Home Living Family/patient expects to be discharged to:: Private residence Living Arrangements: Spouse/significant other Available Help at Discharge: Family Type of Home: House Home Access: Stairs to enter Entrance Stairs-Rails: Right Entrance Stairs-Number of Steps: 3-4 Home Layout: One level Home Equipment: None      Prior Function Level of Independence: Independent               Hand Dominance        Extremity/Trunk Assessment   Upper Extremity Assessment: Generalized weakness           Lower Extremity Assessment: Generalized weakness         Communication   Communication: No difficulties;Other (comment) (difficult to understand at times)  Cognition Arousal/Alertness: Awake/alert Behavior During Therapy: WFL for tasks assessed/performed Overall Cognitive Status: Within Functional Limits for tasks assessed  General Comments      Exercises        Assessment/Plan    PT Assessment Patient needs continued PT services  PT Diagnosis Difficulty walking;Generalized weakness   PT Problem List Decreased strength;Decreased activity tolerance;Decreased balance;Decreased mobility  PT Treatment Interventions DME instruction;Gait training;Functional mobility training;Therapeutic exercise;Therapeutic activities;Balance training;Patient/family education   PT Goals (Current goals can be found in the Care  Plan section) Acute Rehab PT Goals Patient Stated Goal: return home PT Goal Formulation: With patient Time For Goal Achievement: 02/16/16 Potential to Achieve Goals: Good    Frequency Min 3X/week   Barriers to discharge        Co-evaluation               End of Session Equipment Utilized During Treatment: Gait belt Activity Tolerance: No increased pain;Patient tolerated treatment well Patient left: in chair;with call bell/phone within reach;with family/visitor present Nurse Communication: Mobility status         Time: [id number] PT Time Calculation (min) (ACUTE ONLY): 26 min   Charges:   PT Evaluation $PT Eval Moderate Complexity: 1 Procedure PT Treatments $Gait Training: 8-22 mins   PT G Codes:        Quin Hoop 02/02/2016, 4:31 PM  [place], [place]  Acute Rehabilitation Services Pager [phone] Office [phone]

## 2016-02-02 NOTE — Care Management Important Message (Signed)
Important Message  Patient Details  Name: Steve Gomez MRN: [id number] Date of Birth: [birth year]   Medicare Important Message Given:  Yes    Tatanisha Cuthbert, Rory Percy, RN 02/02/2016, 1:34 PM

## 2016-02-02 NOTE — Progress Notes (Signed)
ANTICOAGULATION CONSULT NOTE - Initial Consult  Pharmacy Consult for Heparin Indication: Afib and mesenteric ischemia with SMA occlusion  No Known Allergies  Patient Measurements: Height: 5\' 4"  (162.6 cm) Weight: 114 lb 3.2 oz (51.801 kg) IBW/kg (Calculated) : 59.2 Heparin Dosing Weight: 51 kg  Vital Signs: Temp: 98.3 F (36.8 C) (06/05 1647) Temp Source: Oral (06/05 1647) BP: 120/65 mmHg (06/05 1647) Pulse Rate: 70 (06/05 1012)  Labs:  Recent Labs  01/31/16 0351 02/01/16 0555 02/02/16 0400  HGB 8.3* 8.5* 8.6*  HCT 26.5* 27.2* 28.5*  PLT 228 226 223  APTT  --   --  33  LABPROT  --   --  16.7*  INR  --   --  1.34  CREATININE 0.42* 0.42* 0.48*    Estimated Creatinine Clearance: 59.4 mL/min (by C-G formula based on Cr of 0.48).   Medical History: Past Medical History  Diagnosis Date  . COPD (chronic obstructive pulmonary disease) ([place])   . Hypertension   . High cholesterol   . S/P AAA repair   . Renal calculi 01/14/2016  . New onset atrial fibrillation ([place])   . Bladder outlet obstruction 01/14/2016  . Atherosclerosis   . Tobacco abuse     Assessment: 68 YOM who presented on 5/28 with a GIB after recently starting Xarelto for Afib. Xarelto was reversed on admission. The patient has known chronic mesenteric ischemia and a CT on 6/1 showed a new occlusion of the SMA. 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 Vascular was then consulted and is planning for OR procedure on 6/7. Pharmacy has been consulted to start heparin without a bolus for anticoagulation.   The patient's CBC has stabilized this admission. Hgb 8.6, plts 223. Hep Wt: 51.8 kg, will proceed cautiously and aim for lower end of therapeutic range given recent GIB.   Goal of Therapy:  Heparin level 0.3-0.5 units/ml Monitor platelets by anticoagulation protocol: Yes    Plan:  1. Start heparin at 800 units/hr (8 ml/hr) 2. Will continue to monitor for any signs/symptoms of bleeding and will follow up with heparin level in 8 hours   Alycia Rossetti, PharmD, BCPS Clinical Pharmacist Pager: [phone] 02/02/2016 9:05 PM

## 2016-02-03 ENCOUNTER — Encounter (HOSPITAL_COMMUNITY): Payer: Self-pay | Admitting: Certified Registered Nurse Anesthetist

## 2016-02-03 ENCOUNTER — Encounter: Payer: Commercial Managed Care - HMO | Admitting: Cardiovascular Disease

## 2016-02-03 ENCOUNTER — Encounter: Payer: Self-pay | Admitting: Cardiovascular Disease

## 2016-02-03 LAB — BASIC METABOLIC PANEL
Anion gap: 7 (ref 5–15)
BUN: 6 mg/dL (ref 6–20)
CHLORIDE: 97 mmol/L — AB (ref 101–111)
CO2: 32 mmol/L (ref 22–32)
CREATININE: 0.5 mg/dL — AB (ref 0.61–1.24)
Calcium: 8.6 mg/dL — ABNORMAL LOW (ref 8.9–10.3)
GFR calc non Af Amer: >60 mL/min (ref >60)
Glucose, Bld: 109 mg/dL — ABNORMAL HIGH (ref 65–99)
Potassium: 4 mmol/L (ref 3.5–5.1)
Sodium: 136 mmol/L (ref 135–145)

## 2016-02-03 LAB — CBC
HEMATOCRIT: 32.2 % — AB (ref 39.0–52.0)
HEMOGLOBIN: 9.6 g/dL — AB (ref 13.0–17.0)
MCH: 25.4 pg — AB (ref 26.0–34.0)
MCHC: 29.8 g/dL — ABNORMAL LOW (ref 30.0–36.0)
MCV: 85.2 fL (ref 78.0–100.0)
Platelets: 225 10*3/uL (ref 150–400)
RBC: 3.78 MIL/uL — AB (ref 4.22–5.81)
RDW: 15.8 % — ABNORMAL HIGH (ref 11.5–15.5)
WBC: 10.6 10*3/uL — ABNORMAL HIGH (ref 4.0–10.5)

## 2016-02-03 LAB — SURGICAL PCR SCREEN
MRSA, PCR: NEGATIVE
STAPHYLOCOCCUS AUREUS: POSITIVE — AB

## 2016-02-03 LAB — MAGNESIUM: Magnesium: 1.7 mg/dL (ref 1.7–2.4)

## 2016-02-03 LAB — HEPARIN LEVEL (UNFRACTIONATED)
Heparin Unfractionated: <0.1 IU/mL — ABNORMAL LOW (ref 0.30–0.70)
Heparin Unfractionated: 0.24 IU/mL — ABNORMAL LOW (ref 0.30–0.70)

## 2016-02-03 LAB — ABO/RH: ABO/RH(D): O POS

## 2016-02-03 MED ORDER — DEXTROSE 5 % IV SOLN
1.5000 g | INTRAVENOUS | Status: AC
  Administered 2016-02-04: 1.5 g via INTRAVENOUS
  Filled 2016-02-03: qty 1.5

## 2016-02-03 MED ORDER — SODIUM CHLORIDE 0.9 % IV SOLN: Freq: Once | INTRAVENOUS | Status: DC

## 2016-02-03 NOTE — Progress Notes (Signed)
PROGRESS NOTE  Newman Navratil  [id number] DOB: 1941/07/30 DOA: 01/25/2016 PCP: Alonza Bogus, MD  Brief Narrative:   Mr. [name] 75 year old male with a pmh of HTN, HLD, COPD, tobacco abuse, AAA s/p repair, and recently hospitaslized 5/14- 5/20 diagnosed afib placed on Xarelto; who initially presented to [facility] with complaints of rectal bleeding on 5/28. Initial hemoglobin was noted to be 7.4, transfused 2 units of PRBC. CT scan obtained on 6/1 showed significant signs of vascular disease of the mesenteric arteries with a new occlusion of the SMA. Evaluated by surgery, GI, Cardiology, and IR. Recommended the patient be transferred to [facility] called for further workup by interventional radiology.  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  Due to presence of multi-vessel disease and previous abdominal aortic graft, IR did not perform intervention and requested vascular surgery consultation.  Plan for OR for high risk procedure on 02/04/2016 by Dr. Donnetta Hutching.    Assessment & Plan:   Principal Problem:   Mesenteric ischemia (HCC) Active Problems:   Atrial fibrillation with RVR (HCC)   COPD (chronic obstructive pulmonary disease) (HCC)   Hypertension   High cholesterol   Hypokalemia   Acute blood loss anemia   Rectal bleeding   Protein-calorie malnutrition, severe   Diarrhea   Ischemic disease of gut (HCC)   Pressure ulcer   Acute blood loss anemia secondary to anticoagulation in the setting of acute on chronic mesenteric ischemia -  No bleeding in several days, hemoglobin stable -  Continue heparin, stop if any evidence of bleeding -  NPO Tuesday night for surgery Wed AM  Paroxysmal atrial fibrillation, CHADs2vasc = 3  -  Xarelto stopped due to recent GIB -  Rate controlled with oral amiodraone:  400mg  BID x 7 days through 6/8, then 200mg  BID x 14  days started on 6/9 and ending on 6/22, followed by 200mg  daily thereafter starting on 6/23  COPD/OSA -  Continue nightly CPAP -  Incentive spirometry  Bladder outlet obstruction -  Continue foley catheter  Obstructing right ureteral stone 5-64mm causing moderate right hydronephrosis, asymptomatic.   -  Monitor for signs of UTI -  Creatinine rising slightly but may be related to angiography yesterday -  Resumed flomax 6/5  Severe protein calorie malnutrition -  Liberalize diet and add supplements   DVT prophylaxis:  Heparin gtt Code Status:  Full code Family Communication:  Patient alone today Disposition Plan:  Surgery Wednesday.  High risk surgery, disposition to SNF depending on post-operative course.    Consultants:   At [facility]:  Cardiology, Gastroenterology.    At [facility]:  IR, Vascular surgery  Procedures:  Abdominal angiography by IR  Antimicrobials:   none    Subjective:  No obvious bleeding since starting heparin.  Able to eat a regular diet without difficulty today.    Objective: Filed Vitals:   02/02/16 2126 02/03/16 0514 02/03/16 1000 02/03/16 1803  BP: 124/62 107/62 113/68 105/60  Pulse: 40 57 81 80  Temp: 98.4 F (36.9 C) 97.7 F (36.5 C) 98.3 F (36.8 C) 98 F (36.7 C)  TempSrc: Oral Oral Oral Oral  Resp: 16 16 18 18   Height:      Weight:      SpO2: 92% 93% 96% 98%    Intake/Output Summary (Last 24 hours) at 02/03/16 1844 Last data filed at 02/03/16 1700  Gross  per 24 hour  Intake 2911.94 ml  Output   3600 ml  Net -688.06 ml   Filed Weights   01/31/16 0400 02/01/16 1224 02/01/16 1933  Weight: 53.5 kg (117 lb 15.1 oz) 51.483 kg (113 lb 8 oz) 51.801 kg (114 lb 3.2 oz)    Examination:  General exam:  Cachectic adult male.  No acute distress.  HEENT:  NCAT, MMM Respiratory system: Clear to auscultation bilaterally Cardiovascular system:  IRRR, normal S1/S2. No murmurs, rubs, gallops or clicks.  Warm extremities Gastrointestinal  system: Normal active bowel sounds, soft, nondistended, nontender.  scaphoid MSK:  Decreased tone and bulk, no lower extremity edema Neuro:  Grossly moves all extremities    Data Reviewed: I have personally reviewed following labs and imaging studies  CBC:  Recent Labs Lab 01/28/16 0859 01/29/16 0509 01/30/16 0431 01/31/16 0351 02/01/16 0555 02/02/16 0400 02/03/16 0701  WBC 22.2* 16.1* 12.6* 11.1* 11.0* 10.4 10.6*  NEUTROABS 19.5* 13.4* 10.0* 9.1* 8.6*  --   --   HGB 9.1* 8.2* 8.2* 8.3* 8.5* 8.6* 9.6*  HCT 28.9* 26.6* 26.2* 26.5* 27.2* 28.5* 32.2*  MCV 87.6 87.2 86.5 86.9 87.2 85.6 85.2  PLT 305 265 233 228 226 223 [id number]   Basic Metabolic Panel:  Recent Labs Lab 01/30/16 0431 01/31/16 0351 02/01/16 0555 02/02/16 0400 02/03/16 0701  NA 139 140 138 138 136  K 2.8* 3.0* 3.4* 5.3* 4.0  CL 108 104 100* 102 97*  CO2 27 31 33* 32 32  GLUCOSE 90 85 91 90 109*  BUN <5* <5* <5* <5* 6  CREATININE 0.38* 0.42* 0.42* 0.48* 0.50*  CALCIUM 7.3* 7.2* 7.5* 8.1* 8.6*  MG  --  1.4*  --   --  1.7   GFR: Estimated Creatinine Clearance: 59.4 mL/min (by C-G formula based on Cr of 0.5). Liver Function Tests: No results for input(s): AST, ALT, ALKPHOS, BILITOT, PROT, ALBUMIN in the last 168 hours. No results for input(s): LIPASE, AMYLASE in the last 168 hours. No results for input(s): AMMONIA in the last 168 hours. Coagulation Profile:  Recent Labs Lab 02/02/16 0400  INR 1.34   Cardiac Enzymes: No results for input(s): CKTOTAL, CKMB, CKMBINDEX, TROPONINI in the last 168 hours. BNP (last 3 results) No results for input(s): PROBNP in the last 8760 hours. HbA1C: No results for input(s): HGBA1C in the last 72 hours. CBG:  Recent Labs Lab 01/30/16 1537  GLUCAP 100*   Lipid Profile: No results for input(s): CHOL, HDL, LDLCALC, TRIG, CHOLHDL, LDLDIRECT in the last 72 hours. Thyroid Function Tests: No results for input(s): TSH, T4TOTAL, FREET4, T3FREE, THYROIDAB in the last 72  hours. Anemia Panel: No results for input(s): VITAMINB12, FOLATE, FERRITIN, TIBC, IRON, RETICCTPCT in the last 72 hours. Urine analysis:    Component Value Date/Time   COLORURINE YELLOW 01/25/2016 0852   APPEARANCEUR HAZY* 01/25/2016 0852   LABSPEC 1.015 01/25/2016 0852   PHURINE 6.0 01/25/2016 0852   GLUCOSEU NEGATIVE 01/25/2016 0852   HGBUR LARGE* 01/25/2016 0852   BILIRUBINUR SMALL* 01/25/2016 0852   KETONESUR NEGATIVE 01/25/2016 0852   PROTEINUR 30* 01/25/2016 0852   NITRITE NEGATIVE 01/25/2016 0852   LEUKOCYTESUR TRACE* 01/25/2016 0852   Sepsis Labs: @LABRCNTIP (procalcitonin:4,lacticidven:4)  ) Recent Results (from the past 240 hour(s))  MRSA PCR Screening     Status: None   Collection Time: 01/25/16  1:40 PM  Result Value Ref Range Status   MRSA by PCR NEGATIVE NEGATIVE Final    Comment:  The GeneXpert MRSA Assay (FDA approved for NASAL specimens only), is one component of a comprehensive MRSA colonization surveillance program. It is not intended to diagnose MRSA infection nor to guide or monitor treatment for MRSA infections.   C difficile quick scan w PCR reflex     Status: Abnormal   Collection Time: 01/25/16  2:00 PM  Result Value Ref Range Status   C Diff antigen POSITIVE (A) NEGATIVE Final   C Diff toxin NEGATIVE NEGATIVE Final   C Diff interpretation   Final    C. difficile present, but toxin not detected. This indicates colonization. In most cases, this does not require treatment. If patient has signs and symptoms consistent with colitis, consider treatment. Requires ENTERIC precautions.      Radiology Studies: No results found.   Scheduled Meds: . sodium chloride   Intravenous Once  . amiodarone  400 mg Oral BID  . antiseptic oral rinse  7 mL Mouth Rinse BID  . [START ON 02/04/2016] cefUROXime (ZINACEF)  IV  1.5 g Intravenous On Call to OR  . feeding supplement (ENSURE ENLIVE)  237 mL Oral BID BM  . magnesium oxide  200 mg Oral BID  .  nicotine  21 mg Transdermal Daily  . sodium chloride flush  3 mL Intravenous Q12H  . tamsulosin  0.4 mg Oral QPC supper   Continuous Infusions: . sodium chloride 50 mL/hr at 02/03/16 0621  . heparin 1,100 Units/hr (02/03/16 1723)     LOS: 9 days    Time spent: 30 min    Janece Canterbury, MD [facility] Pager [phone]  If 7PM-7AM, please contact night-coverage [email or web address] Password [facility] 02/03/2016, 6:44 PM

## 2016-02-03 NOTE — Anesthesia Preprocedure Evaluation (Addendum)
Anesthesia Evaluation  Patient identified by MRN, date of birth, ID band Patient awake    Reviewed: Allergy & Precautions, NPO status , Patient's Chart, lab work & pertinent test results  History of Anesthesia Complications Negative for: history of anesthetic complications  Airway Mallampati: II  TM Distance: >3 FB Neck ROM: Full    Dental  (+) Edentulous Upper, Edentulous Lower   Pulmonary COPD,  COPD inhaler and oxygen dependent, Current Smoker,    + rhonchi        Cardiovascular hypertension, Pt. on medications and Pt. on home beta blockers (-) angina+ Peripheral Vascular Disease (s/p AAA repair with stent, mesenteric ischemia)  + dysrhythmias Atrial Fibrillation  Rhythm:Irregular Rate:Normal  5/17 ECHO:  EF 55-60%, mod MR, mod TR   Neuro/Psych negative neurological ROS     GI/Hepatic Neg liver ROS, Diarrhea with mesenteric ischemia   Endo/Other    Renal/GU      Musculoskeletal  (+) Arthritis ,   Abdominal   Peds  Hematology  (+) Blood dyscrasia (INR 1.34, Xarelto, Hb 9.6), ,   Anesthesia Other Findings   Reproductive/Obstetrics                           Anesthesia Physical Anesthesia Plan  ASA: IV  Anesthesia Plan: General   Post-op Pain Management:    Induction: Intravenous  Airway Management Planned: Oral ETT  Additional Equipment: Arterial line, CVP and Ultrasound Guidance Line Placement  Intra-op Plan:   Post-operative Plan: Possible Post-op intubation/ventilation  Informed Consent: I have reviewed the patients History and Physical, chart, labs and discussed the procedure including the risks, benefits and alternatives for the proposed anesthesia with the patient or authorized representative who has indicated his/her understanding and acceptance.     Plan Discussed with: CRNA and Surgeon  Anesthesia Plan Comments: (Plan routine monitors, A line, central access,  GETA with possible post op ventilation)        Anesthesia Quick Evaluation

## 2016-02-03 NOTE — Progress Notes (Signed)
ANTICOAGULATION CONSULT NOTE - Follow Up Consult  Pharmacy Consult for Heparin Indication: atrial fibrillation and mesenteric ischemia with SMA occlusion  No Known Allergies  Patient Measurements: Height: 5\' 4"  (162.6 cm) Weight: 114 lb 3.2 oz (51.801 kg) IBW/kg (Calculated) : 59.2 Heparin Dosing Weight: 51 kg  Vital Signs: Temp: 98.3 F (36.8 C) (06/06 1000) Temp Source: Oral (06/06 1000) BP: 113/68 mmHg (06/06 1000) Pulse Rate: 81 (06/06 1000)  Labs:  Recent Labs  02/01/16 0555 02/02/16 0400 02/03/16 0701  HGB 8.5* 8.6* 9.6*  HCT 27.2* 28.5* 32.2*  PLT 226 223 225  APTT  --  33  --   LABPROT  --  16.7*  --   INR  --  1.34  --   HEPARINUNFRC  --   --  <0.10*  CREATININE 0.42* 0.48* 0.50*    Estimated Creatinine Clearance: 59.4 mL/min (by C-G formula based on Cr of 0.5).   Medications:  Scheduled:  . amiodarone  400 mg Oral BID  . antiseptic oral rinse  7 mL Mouth Rinse BID  . feeding supplement (ENSURE ENLIVE)  237 mL Oral BID BM  . magnesium oxide  200 mg Oral BID  . nicotine  21 mg Transdermal Daily  . sodium chloride flush  3 mL Intravenous Q12H  . tamsulosin  0.4 mg Oral QPC supper   Infusions:  . sodium chloride 50 mL/hr at 02/03/16 [id number]  . heparin 800 Units/hr (02/02/16 2134)    Assessment: 43 YOM who presented on 5/28 with a GIB after recently starting Xarelto for Afib. Xarelto was reversed on admission. The patient has known chronic mesenteric ischemia and a CT on 6/1 showed a new occlusion of the SMA. 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 Vascular was then consulted and is planning for OR procedure on 6/7. Pharmacy has been consulted to start heparin without a bolus for anticoagulation.   The patient's CBC has stabilized this admission.  No further bleeding off Xarelto.  Will proceed cautiously and aim for lower  end of therapeutic range given recent GIB.   Heparin initiated last PM at 800 units/hr.  Heparin level undetectable on this rate.  Will increase by 4 units/kr/hr.  Goal of Therapy:  Heparin level 0.3-0.5 units/ml Monitor platelets by anticoagulation protocol: Yes   Plan:  Increase heparin to 1000 units/hr Heparin level in 6 hours Heparin level and CBC daily while on heparin Follow-up heparin stop time prior to surgery 6/7.  [employer], Pharm.D., BCPS Clinical Pharmacist Pager [phone] 02/03/2016 10:34 AM

## 2016-02-03 NOTE — Progress Notes (Signed)
ANTICOAGULATION CONSULT NOTE - Follow Up Consult  Pharmacy Consult for Heparin Indication: atrial fibrillation and mesenteric ischemia with SMA occlusion  No Known Allergies  Patient Measurements: Height: 5\' 4"  (162.6 cm) Weight: 114 lb 3.2 oz (51.801 kg) IBW/kg (Calculated) : 59.2 Heparin Dosing Weight: 51 kg  Vital Signs: Temp: 98.3 F (36.8 C) (06/06 1000) Temp Source: Oral (06/06 1000) BP: 113/68 mmHg (06/06 1000) Pulse Rate: 81 (06/06 1000)  Labs:  Recent Labs  02/01/16 0555 02/02/16 0400 02/03/16 0701 02/03/16 1632  HGB 8.5* 8.6* 9.6*  --   HCT 27.2* 28.5* 32.2*  --   PLT 226 223 225  --   APTT  --  33  --   --   LABPROT  --  16.7*  --   --   INR  --  1.34  --   --   HEPARINUNFRC  --   --  <0.10* 0.24*  CREATININE 0.42* 0.48* 0.50*  --     Estimated Creatinine Clearance: 59.4 mL/min (by C-G formula based on Cr of 0.5).   Medications:  Scheduled:  . sodium chloride   Intravenous Once  . amiodarone  400 mg Oral BID  . antiseptic oral rinse  7 mL Mouth Rinse BID  . [START ON 02/04/2016] cefUROXime (ZINACEF)  IV  1.5 g Intravenous On Call to OR  . feeding supplement (ENSURE ENLIVE)  237 mL Oral BID BM  . magnesium oxide  200 mg Oral BID  . nicotine  21 mg Transdermal Daily  . sodium chloride flush  3 mL Intravenous Q12H  . tamsulosin  0.4 mg Oral QPC supper   Infusions:  . sodium chloride 50 mL/hr at 02/03/16 [id number]  . heparin 1,000 Units/hr (02/03/16 1035)    Assessment: 75 YOM who presented on 5/28 with a GIB after recently starting Xarelto for Afib. Xarelto was reversed on admission. The patient has known chronic mesenteric ischemia and a CT on 6/1 showed a new occlusion of the SMA. 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 Vascular was then consulted and is planning for OR procedure on 6/7. Pharmacy has been consulted to  start heparin without a bolus for anticoagulation.   The patient's CBC has stabilized this admission.  No further bleeding off Xarelto.  Will proceed cautiously and aim for lower end of therapeutic range given recent GIB.   Heparin level this evening is SUBtherapeutic though trending up (HL 0.24, goal of 0.3-0.5). CBC from this AM stable - no overt s/sx of bleeding noted per RN report.   Goal of Therapy:  Heparin level 0.3-0.5 units/ml Monitor platelets by anticoagulation protocol: Yes   Plan:  1. Increase heparin to 1100 units/hr (11 ml/hr) 2. Will continue to monitor for any signs/symptoms of bleeding and will follow up with heparin level in 8 hours  3. Per RN discussion with vascular - heparin can be turned off when the patient gets to short stay tomorrow.   Alycia Rossetti, PharmD, BCPS Clinical Pharmacist Pager: [phone] 02/03/2016 5:24 PM

## 2016-02-03 NOTE — Progress Notes (Signed)
Laurence Slate, NP from vascular called regarding Heparin drip as the patient is going for procedure tomorrow, she said that the drip can be disconnected at the short stay.

## 2016-02-04 ENCOUNTER — Encounter (HOSPITAL_COMMUNITY)
Admission: EM | Disposition: A | Discharge status: Home Health | Payer: Self-pay | Source: Home / Self Care | Attending: Pulmonary Disease

## 2016-02-04 ENCOUNTER — Inpatient Hospital Stay (HOSPITAL_COMMUNITY): Payer: Commercial Managed Care - HMO | Admitting: Anesthesiology

## 2016-02-04 ENCOUNTER — Inpatient Hospital Stay (HOSPITAL_COMMUNITY): Payer: Commercial Managed Care - HMO

## 2016-02-04 DIAGNOSIS — K55059 Acute (reversible) ischemia of intestine, part and extent unspecified: Secondary | ICD-10-CM

## 2016-02-04 HISTORY — PX: MESENTERIC ARTERY BYPASS: SHX5968

## 2016-02-04 LAB — PROTIME-INR
INR: 1.36 (ref 0.00–1.49)
INR: 1.41 (ref 0.00–1.49)
PROTHROMBIN TIME: 16.9 s — AB (ref 11.6–15.2)
PROTHROMBIN TIME: 17.3 s — AB (ref 11.6–15.2)

## 2016-02-04 LAB — APTT: aPTT: 32 seconds (ref 24–37)

## 2016-02-04 LAB — HEPARIN LEVEL (UNFRACTIONATED)
Heparin Unfractionated: 0.23 IU/mL — ABNORMAL LOW (ref 0.30–0.70)
Heparin Unfractionated: <0.1 IU/mL — ABNORMAL LOW (ref 0.30–0.70)

## 2016-02-04 LAB — BASIC METABOLIC PANEL
Anion gap: 9 (ref 5–15)
Anion gap: 6 (ref 5–15)
BUN: 8 mg/dL (ref 6–20)
BUN: 7 mg/dL (ref 6–20)
CALCIUM: 8.5 mg/dL — AB (ref 8.9–10.3)
CHLORIDE: 102 mmol/L (ref 101–111)
CO2: 30 mmol/L (ref 22–32)
CO2: 27 mmol/L (ref 22–32)
CREATININE: 0.59 mg/dL — AB (ref 0.61–1.24)
Calcium: 7.7 mg/dL — ABNORMAL LOW (ref 8.9–10.3)
Chloride: 98 mmol/L — ABNORMAL LOW (ref 101–111)
Creatinine, Ser: 0.59 mg/dL — ABNORMAL LOW (ref 0.61–1.24)
GFR calc non Af Amer: >60 mL/min (ref >60)
GFR calc non Af Amer: >60 mL/min (ref >60)
Glucose, Bld: 117 mg/dL — ABNORMAL HIGH (ref 65–99)
Glucose, Bld: 138 mg/dL — ABNORMAL HIGH (ref 65–99)
POTASSIUM: 3.8 mmol/L (ref 3.5–5.1)
Potassium: 3.8 mmol/L (ref 3.5–5.1)
SODIUM: 135 mmol/L (ref 135–145)
Sodium: 137 mmol/L (ref 135–145)

## 2016-02-04 LAB — CBC
HCT: 31.6 % — ABNORMAL LOW (ref 39.0–52.0)
HCT: 29.3 % — ABNORMAL LOW (ref 39.0–52.0)
Hemoglobin: 9.7 g/dL — ABNORMAL LOW (ref 13.0–17.0)
Hemoglobin: 8.9 g/dL — ABNORMAL LOW (ref 13.0–17.0)
MCH: 25.9 pg — ABNORMAL LOW (ref 26.0–34.0)
MCH: 25.6 pg — AB (ref 26.0–34.0)
MCHC: 30.7 g/dL (ref 30.0–36.0)
MCHC: 30.4 g/dL (ref 30.0–36.0)
MCV: 84.3 fL (ref 78.0–100.0)
MCV: 84.4 fL (ref 78.0–100.0)
PLATELETS: 194 10*3/uL (ref 150–400)
Platelets: 236 10*3/uL (ref 150–400)
RBC: 3.75 MIL/uL — AB (ref 4.22–5.81)
RBC: 3.47 MIL/uL — AB (ref 4.22–5.81)
RDW: 15.6 % — AB (ref 11.5–15.5)
RDW: 15.4 % (ref 11.5–15.5)
WBC: 13 10*3/uL — ABNORMAL HIGH (ref 4.0–10.5)
WBC: 24.2 10*3/uL — AB (ref 4.0–10.5)

## 2016-02-04 LAB — PREPARE RBC (CROSSMATCH)

## 2016-02-04 LAB — BLOOD GAS, ARTERIAL
ACID-BASE EXCESS: 2.6 mmol/L — AB (ref 0.0–2.0)
Bicarbonate: 27.7 mEq/L — ABNORMAL HIGH (ref 20.0–24.0)
O2 CONTENT: 4 L/min
O2 Saturation: 90.8 %
PATIENT TEMPERATURE: 98.2
PCO2 ART: 51 mmHg — AB (ref 35.0–45.0)
TCO2: 29.3 mmol/L (ref 0–100)
pH, Arterial: 7.352 (ref 7.350–7.450)
pO2, Arterial: 67.6 mmHg — ABNORMAL LOW (ref 80.0–100.0)

## 2016-02-04 LAB — POCT I-STAT 4, (NA,K, GLUC, HGB,HCT)
Glucose, Bld: 167 mg/dL — ABNORMAL HIGH (ref 65–99)
HCT: 25 % — ABNORMAL LOW (ref 39.0–52.0)
Hemoglobin: 8.5 g/dL — ABNORMAL LOW (ref 13.0–17.0)
Potassium: 3.8 mmol/L (ref 3.5–5.1)
SODIUM: 136 mmol/L (ref 135–145)

## 2016-02-04 LAB — MAGNESIUM
MAGNESIUM: 1.7 mg/dL (ref 1.7–2.4)
MAGNESIUM: 1.5 mg/dL — AB (ref 1.7–2.4)

## 2016-02-04 SURGERY — CREATION, BYPASS, ARTERIAL, MESENTERIC
Anesthesia: General | Site: Abdomen

## 2016-02-04 MED ORDER — ROCURONIUM BROMIDE 50 MG/5ML IV SOLN
INTRAVENOUS | Status: AC
  Filled 2016-02-04: qty 3

## 2016-02-04 MED ORDER — AMIODARONE HCL 200 MG PO TABS
400.0000 mg | ORAL_TABLET | Freq: Two times a day (BID) | ORAL | Status: DC
  Administered 2016-02-04 – 2016-02-07 (×5): 400 mg via NASOGASTRIC
  Filled 2016-02-04 (×5): qty 2

## 2016-02-04 MED ORDER — GUAIFENESIN-DM 100-10 MG/5ML PO SYRP: 15.0000 mL | ORAL_SOLUTION | ORAL | Status: DC | PRN

## 2016-02-04 MED ORDER — CHLORHEXIDINE GLUCONATE 0.12 % MT SOLN
15.0000 mL | Freq: Two times a day (BID) | OROMUCOSAL | Status: DC
  Administered 2016-02-04 – 2016-02-09 (×9): 15 mL via OROMUCOSAL
  Filled 2016-02-04 (×9): qty 15

## 2016-02-04 MED ORDER — MEPERIDINE HCL 25 MG/ML IJ SOLN: 6.2500 mg | INTRAMUSCULAR | Status: DC | PRN

## 2016-02-04 MED ORDER — 0.9 % SODIUM CHLORIDE (POUR BTL) OPTIME
TOPICAL | Status: DC | PRN
  Administered 2016-02-04: 3000 mL

## 2016-02-04 MED ORDER — KCL IN DEXTROSE-NACL 20-5-0.45 MEQ/L-%-% IV SOLN
INTRAVENOUS | Status: DC
  Administered 2016-02-04 – 2016-02-09 (×10): via INTRAVENOUS
  Filled 2016-02-04 (×17): qty 1000

## 2016-02-04 MED ORDER — CETYLPYRIDINIUM CHLORIDE 0.05 % MT LIQD
7.0000 mL | Freq: Two times a day (BID) | OROMUCOSAL | Status: DC
  Administered 2016-02-04 – 2016-02-09 (×10): 7 mL via OROMUCOSAL

## 2016-02-04 MED ORDER — HYDROMORPHONE HCL 1 MG/ML IJ SOLN
INTRAMUSCULAR | Status: AC
  Administered 2016-02-04: 0.25 mg via INTRAVENOUS
  Filled 2016-02-04: qty 1

## 2016-02-04 MED ORDER — PROPOFOL 10 MG/ML IV BOLUS
INTRAVENOUS | Status: AC
  Filled 2016-02-04: qty 20

## 2016-02-04 MED ORDER — HYDRALAZINE HCL 20 MG/ML IJ SOLN: 5.0000 mg | INTRAMUSCULAR | Status: DC | PRN

## 2016-02-04 MED ORDER — DEXTROSE 5 % IV SOLN
1.5000 g | Freq: Two times a day (BID) | INTRAVENOUS | Status: AC
  Administered 2016-02-04 – 2016-02-05 (×2): 1.5 g via INTRAVENOUS
  Filled 2016-02-04 (×2): qty 1.5

## 2016-02-04 MED ORDER — DOCUSATE SODIUM 100 MG PO CAPS
100.0000 mg | ORAL_CAPSULE | Freq: Every day | ORAL | Status: DC
  Administered 2016-02-07 – 2016-02-09 (×3): 100 mg via ORAL
  Filled 2016-02-04 (×4): qty 1

## 2016-02-04 MED ORDER — LACTATED RINGERS IV SOLN
INTRAVENOUS | Status: DC | PRN
Start: 2016-02-04 — End: 2016-02-04
  Administered 2016-02-04 (×2): via INTRAVENOUS

## 2016-02-04 MED ORDER — HEPARIN (PORCINE) IN NACL 100-0.45 UNIT/ML-% IJ SOLN
1300.0000 [IU]/h | INTRAMUSCULAR | Status: DC
  Administered 2016-02-05 – 2016-02-06 (×2): 1250 [IU]/h via INTRAVENOUS
  Administered 2016-02-07 – 2016-02-08 (×3): 1300 [IU]/h via INTRAVENOUS
  Filled 2016-02-04 (×5): qty 250

## 2016-02-04 MED ORDER — PHENYLEPHRINE HCL 10 MG/ML IJ SOLN
10.0000 mg | INTRAVENOUS | Status: DC | PRN
  Administered 2016-02-04: 30 ug/min via INTRAVENOUS
  Administered 2016-02-04: 11:00:00 via INTRAVENOUS

## 2016-02-04 MED ORDER — FENTANYL CITRATE (PF) 250 MCG/5ML IJ SOLN
INTRAMUSCULAR | Status: AC
  Filled 2016-02-04: qty 5

## 2016-02-04 MED ORDER — PHENOL 1.4 % MT LIQD: 1.0000 | OROMUCOSAL | Status: DC | PRN

## 2016-02-04 MED ORDER — HYDROMORPHONE HCL 1 MG/ML IJ SOLN
0.2500 mg | INTRAMUSCULAR | Status: DC | PRN
  Administered 2016-02-04: 0.25 mg via INTRAVENOUS

## 2016-02-04 MED ORDER — SODIUM CHLORIDE 0.9 % IV SOLN
INTRAVENOUS | Status: DC | PRN
  Administered 2016-02-04: 500 mL

## 2016-02-04 MED ORDER — MIDAZOLAM HCL 5 MG/5ML IJ SOLN
INTRAMUSCULAR | Status: DC | PRN
  Administered 2016-02-04 (×2): 1 mg via INTRAVENOUS

## 2016-02-04 MED ORDER — PROMETHAZINE HCL 25 MG/ML IJ SOLN: 6.2500 mg | INTRAMUSCULAR | Status: DC | PRN

## 2016-02-04 MED ORDER — HEPARIN SODIUM (PORCINE) 1000 UNIT/ML IJ SOLN
INTRAMUSCULAR | Status: DC | PRN
  Administered 2016-02-04: 5000 [IU] via INTRAVENOUS

## 2016-02-04 MED ORDER — PROPOFOL 10 MG/ML IV BOLUS
INTRAVENOUS | Status: DC | PRN
  Administered 2016-02-04: 80 mg via INTRAVENOUS

## 2016-02-04 MED ORDER — LABETALOL HCL 5 MG/ML IV SOLN: 10.0000 mg | INTRAVENOUS | Status: DC | PRN

## 2016-02-04 MED ORDER — MIDAZOLAM HCL 2 MG/2ML IJ SOLN: 0.5000 mg | Freq: Once | INTRAMUSCULAR | Status: DC | PRN

## 2016-02-04 MED ORDER — ALUM & MAG HYDROXIDE-SIMETH 200-200-20 MG/5ML PO SUSP: 15.0000 mL | ORAL | Status: DC | PRN

## 2016-02-04 MED ORDER — SODIUM CHLORIDE 0.9 % IV SOLN
500.0000 mL | Freq: Once | INTRAVENOUS | Status: AC | PRN
  Administered 2016-02-04: 500 mL via INTRAVENOUS

## 2016-02-04 MED ORDER — KCL IN DEXTROSE-NACL 20-5-0.45 MEQ/L-%-% IV SOLN
INTRAVENOUS | Status: AC
  Filled 2016-02-04: qty 1000

## 2016-02-04 MED ORDER — SODIUM CHLORIDE 0.9 % IV SOLN: 10.0000 mL/h | Freq: Once | INTRAVENOUS | Status: DC

## 2016-02-04 MED ORDER — POTASSIUM CHLORIDE CRYS ER 20 MEQ PO TBCR: 20.0000 meq | EXTENDED_RELEASE_TABLET | Freq: Every day | ORAL | Status: DC | PRN

## 2016-02-04 MED ORDER — DEXTROSE 5 % IV SOLN: 1.5000 g | Freq: Two times a day (BID) | INTRAVENOUS | Status: DC

## 2016-02-04 MED ORDER — MIDAZOLAM HCL 2 MG/2ML IJ SOLN
INTRAMUSCULAR | Status: AC
Start: 2016-02-04 — End: 2016-02-04
  Filled 2016-02-04: qty 2

## 2016-02-04 MED ORDER — ROCURONIUM BROMIDE 100 MG/10ML IV SOLN
INTRAVENOUS | Status: DC | PRN
  Administered 2016-02-04 (×2): 50 mg via INTRAVENOUS

## 2016-02-04 MED ORDER — MAGNESIUM SULFATE 2 GM/50ML IV SOLN
2.0000 g | Freq: Every day | INTRAVENOUS | Status: AC | PRN
Start: 2016-02-04 — End: 2016-02-04
  Administered 2016-02-04: 2 g via INTRAVENOUS
  Filled 2016-02-04: qty 50

## 2016-02-04 MED ORDER — ONDANSETRON HCL 4 MG/2ML IJ SOLN
INTRAMUSCULAR | Status: DC | PRN
Start: 2016-02-04 — End: 2016-02-04
  Administered 2016-02-04: 4 mg via INTRAVENOUS

## 2016-02-04 MED ORDER — PROTAMINE SULFATE 10 MG/ML IV SOLN
INTRAVENOUS | Status: DC | PRN
  Administered 2016-02-04: 50 mg via INTRAVENOUS
  Administered 2016-02-04 (×2): 25 mg via INTRAVENOUS

## 2016-02-04 MED ORDER — FENTANYL CITRATE (PF) 250 MCG/5ML IJ SOLN
INTRAMUSCULAR | Status: DC | PRN
  Administered 2016-02-04 (×2): 50 ug via INTRAVENOUS
  Administered 2016-02-04: 25 ug via INTRAVENOUS
  Administered 2016-02-04: 225 ug via INTRAVENOUS
  Administered 2016-02-04 (×2): 25 ug via INTRAVENOUS
  Administered 2016-02-04: 50 ug via INTRAVENOUS

## 2016-02-04 MED ORDER — SUGAMMADEX SODIUM 200 MG/2ML IV SOLN
INTRAVENOUS | Status: DC | PRN
  Administered 2016-02-04: 200 mg via INTRAVENOUS

## 2016-02-04 MED ORDER — HEPARIN SODIUM (PORCINE) 1000 UNIT/ML IJ SOLN
INTRAMUSCULAR | Status: AC
Start: 2016-02-04 — End: 2016-02-04
  Filled 2016-02-04: qty 1

## 2016-02-04 MED ORDER — PHENYLEPHRINE HCL 10 MG/ML IJ SOLN
INTRAMUSCULAR | Status: DC | PRN
  Administered 2016-02-04 (×3): 80 ug via INTRAVENOUS
  Administered 2016-02-04: 120 ug via INTRAVENOUS

## 2016-02-04 MED ORDER — ONDANSETRON HCL 4 MG/2ML IJ SOLN
INTRAMUSCULAR | Status: AC
  Filled 2016-02-04: qty 2

## 2016-02-04 MED ORDER — METOPROLOL TARTRATE 5 MG/5ML IV SOLN: 2.0000 mg | INTRAVENOUS | Status: DC | PRN

## 2016-02-04 MED ORDER — ALBUMIN HUMAN 5 % IV SOLN
INTRAVENOUS | Status: DC | PRN
  Administered 2016-02-04: 11:00:00 via INTRAVENOUS

## 2016-02-04 MED ORDER — FAMOTIDINE IN NACL 20-0.9 MG/50ML-% IV SOLN
20.0000 mg | Freq: Two times a day (BID) | INTRAVENOUS | Status: DC
  Administered 2016-02-04 – 2016-02-09 (×11): 20 mg via INTRAVENOUS
  Filled 2016-02-04 (×13): qty 50

## 2016-02-04 MED ORDER — LACTATED RINGERS IV SOLN
INTRAVENOUS | Status: DC | PRN
  Administered 2016-02-04: 08:00:00 via INTRAVENOUS

## 2016-02-04 MED ORDER — MORPHINE SULFATE (PF) 2 MG/ML IV SOLN
2.0000 mg | INTRAVENOUS | Status: DC | PRN
  Administered 2016-02-04: 4 mg via INTRAVENOUS
  Administered 2016-02-05 – 2016-02-06 (×3): 2 mg via INTRAVENOUS
  Filled 2016-02-04: qty 1
  Filled 2016-02-04: qty 2
  Filled 2016-02-04 (×2): qty 1

## 2016-02-04 SURGICAL SUPPLY — 52 items
BENZOIN TINCTURE PRP APPL 2/3 (GAUZE/BANDAGES/DRESSINGS) ×6 IMPLANT
CANISTER SUCTION 2500CC (MISCELLANEOUS) ×3 IMPLANT
CANNULA VESSEL 3MM 2 BLNT TIP (CANNULA) ×6 IMPLANT
CATH EMB 4FR 40CM (CATHETERS) ×3 IMPLANT
CLIP LIGATING EXTRA MED SLVR (CLIP) ×3 IMPLANT
CLIP LIGATING EXTRA SM BLUE (MISCELLANEOUS) ×3 IMPLANT
CLIP TI WIDE RED SMALL 24 (CLIP) ×3 IMPLANT
CLOSURE STERI-STRIP 1/2X4 (GAUZE/BANDAGES/DRESSINGS) ×2
CLSR STERI-STRIP ANTIMIC 1/2X4 (GAUZE/BANDAGES/DRESSINGS) ×4 IMPLANT
DRSG COVADERM 4X14 (GAUZE/BANDAGES/DRESSINGS) ×3 IMPLANT
ELECT BLADE 4.0 EZ CLEAN MEGAD (MISCELLANEOUS) ×3
ELECT REM PT RETURN 9FT ADLT (ELECTROSURGICAL) ×6
ELECTRODE BLDE 4.0 EZ CLN MEGD (MISCELLANEOUS) ×1 IMPLANT
ELECTRODE REM PT RTRN 9FT ADLT (ELECTROSURGICAL) ×2 IMPLANT
FELT TEFLON 1X6 (MISCELLANEOUS) ×3 IMPLANT
GLOVE BIO SURGEON STRL SZ7 (GLOVE) ×3 IMPLANT
GLOVE BIO SURGEON STRL SZ7.5 (GLOVE) ×3 IMPLANT
GLOVE BIOGEL PI IND STRL 6.5 (GLOVE) ×1 IMPLANT
GLOVE BIOGEL PI IND STRL 7.5 (GLOVE) ×1 IMPLANT
GLOVE BIOGEL PI INDICATOR 6.5 (GLOVE) ×2
GLOVE BIOGEL PI INDICATOR 7.5 (GLOVE) ×2
GLOVE ECLIPSE 6.5 STRL STRAW (GLOVE) ×3 IMPLANT
GLOVE SS BIOGEL STRL SZ 7.5 (GLOVE) ×4 IMPLANT
GLOVE SUPERSENSE BIOGEL SZ 7.5 (GLOVE) ×8
GOWN STRL REUS W/ TWL LRG LVL3 (GOWN DISPOSABLE) ×3 IMPLANT
GOWN STRL REUS W/TWL LRG LVL3 (GOWN DISPOSABLE) ×6
GRAFT CV 30X6KNTD STRG TUBE (Vascular Products) ×1 IMPLANT
GRAFT HEMASHIELD 6MM (Vascular Products) ×2 IMPLANT
INSERT FOGARTY 61MM (MISCELLANEOUS) ×3 IMPLANT
INSERT FOGARTY SM (MISCELLANEOUS) ×6 IMPLANT
KIT BASIN OR (CUSTOM PROCEDURE TRAY) ×3 IMPLANT
KIT ROOM TURNOVER OR (KITS) ×3 IMPLANT
NS IRRIG 1000ML POUR BTL (IV SOLUTION) ×9 IMPLANT
PACK AORTA (CUSTOM PROCEDURE TRAY) ×3 IMPLANT
PAD ARMBOARD 7.5X6 YLW CONV (MISCELLANEOUS) ×6 IMPLANT
SLEEVE SURGEON STRL (DRAPES) ×3 IMPLANT
SPONGE LAP 18X18 X RAY DECT (DISPOSABLE) IMPLANT
STAPLER VISISTAT 35W (STAPLE) ×3 IMPLANT
SUT ETHIBOND 5 LR DA (SUTURE) IMPLANT
SUT PDS AB 1 TP1 54 (SUTURE) ×6 IMPLANT
SUT PROLENE 3 0 SH1 36 (SUTURE) ×6 IMPLANT
SUT PROLENE 5 0 C 1 24 (SUTURE) IMPLANT
SUT PROLENE 5 0 C 1 36 (SUTURE) ×3 IMPLANT
SUT PROLENE 6 0 CC (SUTURE) ×6 IMPLANT
SUT SILK 2 0 SH CR/8 (SUTURE) ×3 IMPLANT
SUT VIC AB 2-0 CT1 36 (SUTURE) ×6 IMPLANT
SUT VIC AB 3-0 SH 27 (SUTURE)
SUT VIC AB 3-0 SH 27X BRD (SUTURE) IMPLANT
SYRINGE 3CC LL L/F (MISCELLANEOUS) ×3 IMPLANT
TOWEL BLUE STERILE X RAY DET (MISCELLANEOUS) ×6 IMPLANT
TRAY FOLEY W/METER SILVER 16FR (SET/KITS/TRAYS/PACK) IMPLANT
WATER STERILE IRR 1000ML POUR (IV SOLUTION) ×3 IMPLANT

## 2016-02-04 NOTE — Progress Notes (Signed)
  Day of Surgery Note    Subjective:  Sleepy-awakes to voice  Filed Vitals:   02/03/16 2100 02/04/16 0413  BP: 117/76 128/69  Pulse: 63 68  Temp: 98.4 F (36.9 C) 97.8 F (36.6 C)  Resp: 19 19    Incisions:   Bandage is clean with scant drainage Extremities:  Easily palpable DP pulses bilaterally Cardiac:  irregular Lungs:  Non labored Abdomen:  Soft, NT/ND  Assessment/Plan:  This is a 75 y.o. male who is s/p aorto-mesenteric bypass   -pt in pacu and doing well -BP is soft with systolic [id number].  He is receiving PRBC's -pt is on C diff isolation -amiodarone per NGT -will restart heparin in the morning if hgb stable   Leontine Locket, PA-C 02/04/2016 12:49 PM

## 2016-02-04 NOTE — Transfer of Care (Signed)
Immediate Anesthesia Transfer of Care Note  Patient: Steve Gomez  Procedure(s) Performed: Procedure(s): AORTO-SUPERIOR MESENTERIC ARTERY BYPASS GRAFT USING 6MM X 30 CM HEMASHIELD GOLD GRAFT (N/A)  Patient Location: PACU  Anesthesia Type:General  Level of Consciousness: patient cooperative and responds to stimulation  Airway & Oxygen Therapy: Patient Spontanous Breathing, Patient connected to nasal cannula oxygen and OPA  Post-op Assessment: Report given to RN and Post -op Vital signs reviewed and stable  Post vital signs: Reviewed and stable  Last Vitals:  Filed Vitals:   02/03/16 2100 02/04/16 0413  BP: 117/76 128/69  Pulse: 63 68  Temp: 36.9 C 36.6 C  Resp: 19 19    Last Pain:  Filed Vitals:   02/04/16 0413  PainSc: 0-No pain         Complications: No apparent anesthesia complications

## 2016-02-04 NOTE — Op Note (Signed)
OPERATIVE REPORT  DATE OF SURGERY: 02/04/2016  PATIENT: Steve Gomez, 75 y.o. male MRN: [id number]  DOB: 1941-03-02  PRE-OPERATIVE DIAGNOSIS: Mesenteric ischemia  POST-OPERATIVE DIAGNOSIS:  Same  PROCEDURE: 6 mm Hemashield graft from prior aortic graft to spare mesenteric artery  SURGEON:  Gomez Jews, M.D.  PHYSICIAN ASSISTANT: Dr. Creig Hines, kim Trinh PA-C  ANESTHESIA:  Gen.  EBL: 100 ml  Total I/O In: 3310.4 [I.V.:2585.4; Blood:335; NG/GT:90; IV Piggyback:300] Out: [id number] [Urine:1275; Emesis/NG output:20; Blood:100]  BLOOD ADMINISTERED: None  DRAINS: None  SPECIMEN: Small cyst from omentum  COUNTS CORRECT:  YES  PLAN OF CARE: Surgical intensive care unit stable   PATIENT DISPOSITION:  PACU - hemodynamically stable  PROCEDURE DETAILS: Patient was taken up in place was repaired the abdomen was prepped and draped in usual sterile fashion. The prior midline incision was marked. Incision was made through the old incision from the level of xiphoid to the pubis. The midline fascia was opened with electrocautery and 13 was entered. There was some omentum stuck to the anterior abdominal wall and this was taken down. The fascia was opened from the xiphoid to the pubis. The was significant amount of adhesions and these were taken down for exposure. The transverse colon omentum reflected. Lee and the small bowel was reflected to the right. The aorta was exposed and the old aortic wall was resected to expose the old graft. The patient had a ruptured aneurysm greater than 20 years ago with graft placement at that time. This was adequate for inflow. Next the stricture mesenteric artery was identified at the root of the mesentery. There was no pulse. The superior mesenteric artery was exposed just distal to the origin to proximal to more distal to allow for anastomosis. The artery was thickened but did appear to be adequate for sewing. Patient was given 6000 units of intravenous heparin  affect circulation times per mesenteric artery was occluded at its origin with Henley clamp and distally with a looped vessel loop. The artery was opened with 11 blade some ulcerative Potts scissors. The sixth oh meter graft from field was spatulated and was positioned with the toe of the graft towards the aorta and the heel away from the aorta. This was sewn with a running 6-0 Prolene suture. Should be noted for the anastomosis 4 Fogarty was passed through the posterior mesenteric artery and subacute thrombus was removed. This gave better backbleeding the anastomosis was end to side and after the usual flushing maneuvers the anastomosis completed. The graft was flushed heparinized and reoccluded. The graft was then brought down to the level of the aorta. The small bowel mesentery was allowed to retract mouth toward the aorta to prevent kinking of the graft. A partial occlusion clamp was placed on the aortic graft and a segment of the graft was excised. The 6 mm Hemashield graft was spatulated and sewn end-to-side to the aorta with a running 5-0 Prolene suture. Again the usual flushing maneuvers were undertaken. Anastomosis completed and the clamps removed with excellent Doppler flow in spare mesenteric artery. The patient was given 50 mg protamine to reverse the heparin. Wounds irrigated with saline. The risks peritoneum was closed over the Dacron graft to prevent contact with the intestine. The small bowel was run in its entirety and found be without injury. This placed back in the pelvis. Transverse colon omentum replaced over this. Midline fascia was closed with a #1 Prolene beginning at the superior and inferior aspect and sewing to the middle.  Skin was closed with 3 or septic or Vicryl sutures. Sterile dressing was applied the patient was transferred to the recovery room in stable condition   Gomez Jews, M.D. 02/04/2016 4:59 PM

## 2016-02-04 NOTE — Interval H&P Note (Signed)
History and Physical Interval Note:  02/04/2016 7:54 AM  Steve Gomez  has presented today for surgery, with the diagnosis of Mesenteric Ischemia K55.1  The various methods of treatment have been discussed with the patient and family. After consideration of risks, benefits and other options for treatment, the patient has consented to  Procedure(s): AORTO-MESENTERIC ARTERY BYPASS (N/A) as a surgical intervention .  The patient's history has been reviewed, patient examined, no change in status, stable for surgery.  I have reviewed the patient's chart and labs.  Questions were answered to the patient's satisfaction.     Gomez Jews

## 2016-02-04 NOTE — Anesthesia Postprocedure Evaluation (Signed)
Anesthesia Post Note  Patient: [name]  Procedure(s) Performed: Procedure(s) (LRB): AORTO-SUPERIOR MESENTERIC ARTERY BYPASS GRAFT USING 6MM X 30 CM HEMASHIELD GOLD GRAFT (N/A)  Patient location during evaluation: PACU Anesthesia Type: General Level of consciousness: sedated, patient cooperative and oriented Pain management: pain level controlled Vital Signs Assessment: post-procedure vital signs reviewed and stable Respiratory status: spontaneous breathing, nonlabored ventilation, respiratory function stable and patient connected to nasal cannula oxygen Cardiovascular status: blood pressure returned to baseline and stable Postop Assessment: no signs of nausea or vomiting Anesthetic complications: no    Last Vitals:  Filed Vitals:   02/04/16 1355 02/04/16 1410  BP: 138/60 120/59  Pulse: 90 91  Temp: 36.5 C   Resp: 19 16    Last Pain:  Filed Vitals:   02/04/16 1421  PainSc: Asleep                 Raahim Shartzer,E. Asyria Kolander

## 2016-02-04 NOTE — H&P (View-Only) (Signed)
[facility] NOTE   MRN : [id number]  Reason for Consult: mesenteric ischemia Referring Physician: Dr. Sheran Fava  History of Present Illness: 75 y/o male admitted to [facility] with chief complaints of abdominal pain and rectal bleeding.  He has a history of AAA repair 20 years ago, COPD, Hypertension,  And A fib recently started on Xarelto 01/16/2016.   He reports he has lost 61-70 bls in the past 2 years.  Up until 01/25/2016 he was eating what ever he wanted, he then got sudden abdominal pain while eating a sandwich and had a bloody BM.  He was noted to have a hemoglobin of 7.4 at the time of admission.  He was recently discharged from [facility] on 01/17/2016, admitted for workup of of abdominal pain with unclear etiology. Patient was discharged home on by mouth Ceftin.      Current Facility-Administered Medications  Medication Dose Route Frequency Provider Last Rate Last Dose  . 0.9 %  sodium chloride infusion   Intravenous Continuous Donne Hazel, MD 100 mL/hr at 02/01/16 2307    . acetaminophen (TYLENOL) tablet 650 mg  650 mg Oral Q6H PRN Donne Hazel, MD       Or  . acetaminophen (TYLENOL) suppository 650 mg  650 mg Rectal Q6H PRN Donne Hazel, MD      . amiodarone (PACERONE) tablet 400 mg  400 mg Oral BID Arnoldo Lenis, MD   400 mg at 02/02/16 1114  . antiseptic oral rinse (CPC / CETYLPYRIDINIUM CHLORIDE 0.05%) solution 7 mL  7 mL Mouth Rinse BID Sinda Du, MD   7 mL at 02/02/16 1120  . feeding supplement (ENSURE ENLIVE) (ENSURE ENLIVE) liquid 237 mL  237 mL Oral BID BM Janece Canterbury, MD   237 mL at 02/02/16 1121  . magnesium oxide (MAG-OX) tablet 200 mg  200 mg Oral BID Lendon Colonel, NP   200 mg at 02/02/16 1115  . morphine 2 MG/ML injection 2 mg  2 mg Intravenous Q4H PRN Donne Hazel, MD      . nicotine (NICODERM CQ - dosed in mg/24 hours) patch 21 mg  21 mg Transdermal Daily Sinda Du, MD   21 mg at 02/02/16 1118  .  ondansetron (ZOFRAN) tablet 4 mg  4 mg Oral Q6H PRN Donne Hazel, MD       Or  . ondansetron [facility]) injection 4 mg  4 mg Intravenous Q6H PRN Donne Hazel, MD      . sodium chloride flush (NS) 0.9 % injection 3 mL  3 mL Intravenous Q12H Donne Hazel, MD   3 mL at 02/01/16 1000  . tamsulosin (FLOMAX) capsule 0.4 mg  0.4 mg Oral QPC supper Janece Canterbury, MD        Pt meds include: Statin :Yes Betablocker: Yes ASA: Yes Other anticoagulants/antiplatelets: none currently  Past Medical History  Diagnosis Date  . COPD (chronic obstructive pulmonary disease) ([place])   . Hypertension   . High cholesterol   . S/P AAA repair   . Renal calculi 01/14/2016  . New onset atrial fibrillation ([place])   . Bladder outlet obstruction 01/14/2016  . Atherosclerosis   . Tobacco abuse     Past Surgical History  Procedure Laterality Date  . Cholecystectomy    . Abdominal aortic aneurysm repair      Social History Social History  Substance Use Topics  . Smoking status: Current Every Day Smoker -- 1.00  packs/day    Types: Cigarettes    Start date: 08/30/1960  . Smokeless tobacco: None  . Alcohol Use: 0.0 oz/week    0 Standard drinks or equivalent per week     Comment: "just a couple of swallows of wine every night"    Family History Family History  Problem Relation Age of Onset  . Suicidality Father     Deceased   . Diabetes Brother   . Diabetes Brother   . Diabetes Brother     No Known Allergies   REVIEW OF SYSTEMS  General: [ ]  Weight loss, [ ]  Fever, [ ]  chills Neurologic: [ ]  Dizziness, [ ]  Blackouts, [ ]  Seizure [ ]  Stroke, [ ]  "Mini stroke", [ ]  Slurred speech, [ ]  Temporary blindness; [ ]  weakness in arms or legs, [ ]  Hoarseness [ ]  Dysphagia Cardiac: [ ]  Chest pain/pressure, [ ]  Shortness of breath at rest [ ]  Shortness of breath with exertion, [x ] Atrial fibrillation or irregular heartbeat  Vascular: [ ]  Pain in legs with walking, [ ]  Pain in legs at rest, [ ]  Pain in  legs at night,  [ ]  Non-healing ulcer, [ ]  Blood clot in vein/DVT,   Pulmonary: [ ]  Home oxygen, [ ]  Productive cough, [ ]  Coughing up blood, [ ]  Asthma,  [ ]  Wheezing [ ]  COPD Musculoskeletal:  [ ]  Arthritis, [ ]  Low back pain, [ ]  Joint pain Hematologic: [ ]  Easy Bruising, [ ]  Anemia; [ ]  Hepatitis Gastrointestinal: [x ] Blood in stool, [ ]  Gastroesophageal Reflux/heartburn, [x] post prandial pain Urinary: [ ]  chronic Kidney disease, [ ]  on HD - [ ]  MWF or [ ]  TTHS, [ ]  Burning with urination, [ ]  Difficulty urinating Skin: [ ]  Rashes, [ ]  Wounds Psychological: [ ]  Anxiety, [ ]  Depression  Physical Examination Filed Vitals:   02/02/16 0641 02/02/16 0743 02/02/16 0850 02/02/16 1012  BP: 128/62 127/63 140/84 121/64  Pulse: 77 75 84 70  Temp: 98.6 F (37 C) 98.7 F (37.1 C)  98.7 F (37.1 C)  TempSrc: Oral Oral  Oral  Resp: 16 17 24 18   Height:      Weight:      SpO2: 98% 99% 99% 99%   Body mass index is 19.59 kg/(m^2).  General:  WDWN in NAD  HENT: WNL Eyes: Pupils equal Pulmonary: normal non-labored breathing , without Rales, rhonchi,  wheezing Cardiac: RRR, without  Murmurs, rubs or gallops; No carotid bruits Abdomen: soft, NT, no masses.  Positive BS all 4 quadrants. Skin: no rashes, ulcers noted;  no Gangrene , no cellulitis; no open wounds;   Vascular Exam/Pulses:Palpable radial , femoral, DP/PT pulses bilatereally   Musculoskeletal: no muscle wasting or atrophy; no edema  Neurologic: A&O X 3; Appropriate Affect ;  SENSATION: normal; MOTOR FUNCTION: 5/5 Symmetric Speech is fluent/normal   Significant Diagnostic Studies: CBC Lab Results  Component Value Date   WBC 10.4 02/02/2016   HGB 8.6* 02/02/2016   HCT 28.5* 02/02/2016   MCV 85.6 02/02/2016   PLT 223 02/02/2016    BMET    Component Value Date/Time   NA 138 02/02/2016 0400   K 5.3* 02/02/2016 0400   CL 102 02/02/2016 0400   CO2 32 02/02/2016 0400   GLUCOSE 90 02/02/2016 0400   BUN <5*  02/02/2016 0400   CREATININE 0.48* 02/02/2016 0400   CALCIUM 8.1* 02/02/2016 0400   GFRNONAA >60 02/02/2016 0400   GFRAA >60 02/02/2016 0400   Estimated Creatinine  Clearance: 59.4 mL/min (by C-G formula based on Cr of 0.48).  COAG Lab Results  Component Value Date   INR 1.34 02/02/2016   INR 2.40* 01/25/2016   INR 1.24 01/11/2016     Non-Invasive Vascular Imaging:  IMPRESSION: VASCULAR  1. Severe and largely heavily calcified vascular disease with high-grade stenosis versus occlusion of the celiac artery, occlusion of the origin of the superior mesenteric artery and surgical ligation of the inferior mesenteric artery. These findings are highly consistent with a chronic mesenteric ischemia affecting the entirety of the gastrointestinal tract. Nearly all flow to the gastrointestinal tract may be via collateral pathways. 2. Surgical changes of prior infrarenal abdominal aortic repair with aortic tube graft extending from the infrarenal aorta to just proximal to the aortic bifurcation. 3. Focal moderate calcified stenosis of the infrarenal abdominal aorta in the region of the proximal tube graft anastomosis. 4. Focal high-grade calcified stenoses of the bilateral common iliac artery origins followed by poststenotic dilatation as described above. 5. Bulky calcified atherosclerotic plaque in both common femoral arteries resulting in mild to moderate stenosis. 6. Primarily fibro fatty plaque results in a mild narrowing of the right renal artery origin. 7. Calcified plaque results in moderate narrowing of the left renal artery origin.   ASSESSMENT/PLAN:   Mesenteric ischemia Post prandial pain for about a month on and off Currently on clear liquid diet, tolerating it well without pain He has positive BS in all four quadrants.   He has no reports of GI bleed since they stopped the Xarelto Dr. Donnetta Hutching will review the studies and make recommendations which may include SMA by  pass surgery.  Positive C diff   Genella Bas MAUREEN 02/02/2016 11:40 AM  I have examined the patient, reviewed and agree with above.Extremely complex patient. Prior history of what sounds like a ruptured abdominal aortic aneurysm treated in [place] at least 20 years ago. Had a aortic tube graft from below the renals to the aortic bifurcation. Denies any prior cardiac difficulty. Specifically no myocardial infarction and no history of congestive heart failure. Recent echocardiogram showed ejection fraction greater than 50%. Presented with the atrial fibrillation in mid May. The patient reports that for quite some time he does have abdominal pain after a large meal. Has lost a great deal of weight over the last 2 years. Has had worsening abdominal pain over the past several weeks. Underwent upper and lower endoscopy in [place] showing extensive ulceration in his stomach and small bowel and also throughout his colon. This was consistent with diffuse ischemia. He has had CT angiograms on May 14 on June 1 and one in between these 2. This did show extensive atherosclerotic disease. He has extreme calcification and stenosis at the origin of the celiac and superior mesenteric arteries. Inferior mesenteric artery was ligated at the time of his aneurysm repair. There was a significant change in his superior mesenteric flow between May 14 and June 1. The area of high-grade stenosis in the proximal superior mesenteric artery was thrombosed on the June 1 study.  I discussed this at length with the patient and his family present. Also reviewed his films with Dr. Vernard Gambles with interventional radiology. He'll that there is no possible option for endovascular treatment with ballooning or stenting due to the extensive calcification and occlusions of his origin. Do not feel that he can live with his level of ischemia with continued bloody stools and diffuse ischemia throughout his intestinal tract. There was  only option would be  aorta SMA bypass. Explained the magnitude of the procedure especially with his prior aneurysm repair. Explained the expected prolonged hospitalization in intensive care time and also prolonged recovery. Patient understands we will schedule this for Wednesday. Will discuss further tomorrow  Curt Jews, MD 02/02/2016 4:48 PM

## 2016-02-04 NOTE — Progress Notes (Signed)
PT Cancellation Note  Patient Details Name: Estuardo Sieloff MRN: [id number] DOB: 1941/06/21   Cancelled Treatment:    Reason Eval/Treat Not Completed: Patient at procedure or test/unavailable   For AORTO-MESENTERIC ARTERY BYPASS today;   Will follow up for PT tomorrow;   Thanks,  Roney Marion, PT  Acute Rehabilitation Services Pager [phone] Office [phone]    [place], [place] 02/04/2016, 8:20 AM

## 2016-02-04 NOTE — Progress Notes (Signed)
Subjective: Interval History: none.. Comfortable and surgical intensive care unit. Awake and oriented. Reports mild discomfort  Objective: Vital signs in last 24 hours: Temp:  [97.4 F (36.3 C)-98.4 F (36.9 C)] 97.7 F (36.5 C) (06/07 1355) Pulse Rate:  [63-110] 90 (06/07 1700) Resp:  [16-21] 17 (06/07 1700) BP: (81-138)/(48-76) 130/71 mmHg (06/07 1700) SpO2:  [89 %-100 %] 100 % (06/07 1700) Arterial Line BP: (88-156)/(39-57) 149/57 mmHg (06/07 1410) Weight:  [112 lb 12.8 oz (51.166 kg)] 112 lb 12.8 oz (51.166 kg) (06/06 2100)  Intake/Output from previous day: 06/06 0701 - 06/07 0700 In: 720 [P.O.:720] Out: 4150 [Urine:4150] Intake/Output this shift: Total I/O In: 3310.4 [I.V.:2585.4; Blood:335; NG/GT:90; IV Piggyback:300] Out: [id number] [Urine:1275; Emesis/NG output:20; Blood:100]  Abdomen soft. Dorsalis pedis pulses 2+ bilaterally  Lab Results:  Recent Labs  02/04/16 0206 02/04/16 1049 02/04/16 1513  WBC 13.0*  --  24.2*  HGB 9.7* 8.5* 8.9*  HCT 31.6* 25.0* 29.3*  PLT 236  --  194   BMET  Recent Labs  02/04/16 0206 02/04/16 1049 02/04/16 1300  NA 137 136 135  K 3.8 3.8 3.8  CL 98*  --  102  CO2 30  --  27  GLUCOSE 117* 167* 138*  BUN 8  --  7  CREATININE 0.59*  --  0.59*  CALCIUM 8.5*  --  7.7*    Studies/Results: Ct Abdomen Pelvis W Wo Contrast  01/25/2016  CLINICAL DATA:  GI bleeding, prior portal venous gas on previous CT EXAM: CT ABDOMEN AND PELVIS WITHOUT AND WITH CONTRAST TECHNIQUE: Multidetector CT imaging of the abdomen and pelvis was performed following the standard protocol before and following the bolus administration of intravenous contrast. CONTRAST:  121mL ISOVUE-300 IOPAMIDOL (ISOVUE-300) INJECTION 61% COMPARISON:  01/11/2016 FINDINGS: Lung bases are free of acute infiltrate or sizable effusion. Gallbladder has been surgically removed. The previously seen portal venous air within the left lobe of the liver is no longer identified. Portal vein is  well visualized and demonstrates a normal branching pattern. No portal venous thrombosis is noted. Again no portal venous air is seen. The spleen, right adrenal gland and pancreas are stable in appearance from the prior exam. Small duodenal diverticulum is seen. Left adrenal gland demonstrates some hypertrophy but stable from the prior study. The kidneys are stable in appearance with cystic change and nonobstructing renal calculi bilaterally. The bladder is decompressed by Foley catheter. Prostatic calcifications are seen. No free pelvic fluid is noted. Mild diverticular change is noted without evidence of diverticulitis. No pooling of contrast to correspond with the given clinical history of GI bleeding is noted. Aortoiliac calcifications are seen. Tortuosity of the abdominal aorta is noted. Tube graft is noted within the distal aorta related to prior surgical repair. This is stable from the prior exam. The appendix is not well visualized although no inflammatory changes are seen. The osseous structures show a scoliosis of the lumbar spine concave to the left. This is stable from the prior exam. No acute bony abnormality is noted. IMPRESSION: Resolution of previously seen portal venous gas. No findings to correspond with the patient's given clinical history of active GI bleeding are seen. Direct visualization may be helpful. Nuclear bleeding scan may also be helpful for localization Remainder of the exam is stable from the prior study with evidence of bilateral renal calculi and right renal cystic change without obstructive change. Chronic changes as described. Electronically Signed   By: Inez Catalina M.D.   On: 01/25/2016 19:48  Ct Abdomen Pelvis W Contrast  01/11/2016  ADDENDUM REPORT: 01/11/2016 22:37 ADDENDUM: These results were called by telephone at the time of interpretation on <CurrentDate> at <CurrentTime> to Dr. Shanon Brow , who verbally acknowledged these results. Electronically Signed   By: Anner Crete M.D.   On: 01/11/2016 22:37  01/11/2016  CLINICAL DATA:  75 year old male with bilateral lower quadrant abdominal pain. EXAM: CT ABDOMEN AND PELVIS WITH CONTRAST TECHNIQUE: Multidetector CT imaging of the abdomen and pelvis was performed using the standard protocol following bolus administration of intravenous contrast. CONTRAST:  186mL ISOVUE-300 IOPAMIDOL (ISOVUE-300) INJECTION 61% COMPARISON:  None. FINDINGS: The visualized lung bases are clear. There is coronary vascular calcification. No intra-abdominal free air or free fluid. Cholecystectomy. Linear and branching air noted within the peripheral left lobe of the liver concerning for of the portal venous gas. The liver is otherwise unremarkable. The pancreas, spleen, and the right adrenal gland appear unremarkable. There is mild thickening of the left adrenal gland. Multiple small nonobstructing bilateral renal calculi measuring up to 4 mm. There is mild fullness of the renal collecting systems bilaterally. There is a 5 mm calculus in the proximal right ureter. There is a 2.6 cm exophytic hypodense lesion arising from the posterior cortex of the right kidney most compatible with a cyst. The urinary bladder is distended. There is trabecular appearance of the bladder wall compatible with chronic bladder outlet obstruction. The prostate gland is mildly enlarged and measures 4.5 cm in transverse diameter. There is apparent thickening of the distal stomach in the region of the antrum and pylorus which may be related to underdistention or represent gastritis. Clinical correlation is recommended. Multiple small duodenal diverticula without definite active inflammation. There is no evidence of bowel obstruction. The visualized appendix appears unremarkable. There is aortoiliac atherosclerotic disease. There is focal area of advanced atherosclerotic plaque with narrowing of the aorta just inferior to the origins of the renal arteries. The aorta is tortuous.  There is atherosclerotic calcifications of the origins of the celiac axis and SMA. The origins the celiac axis, SMA appear patent. The SMV, and main portal vein are patent. No portal venous gas identified. There is no adenopathy. Set the abdominal wall soft tissues appear unremarkable. There is osteopenia with degenerative changes of the spine. No acute fracture. IMPRESSION: Linear branching air in the left lobe of the liver concerning for portal venous gas. This is of indeterminate etiology, however the possibility of ischemic bowel is not excluded. Clinical correlation is recommended. Underdistention of the stomach versus gastritis. No evidence of bowel obstruction. No CT evidence for acute appendicitis. A 5 mm proximal right ureteral calculus with small nonobstructing bilateral renal calculi. Mild fullness of the renal collecting systems bilaterally. Trabecular appearance of the urinary bladder compatible with chronic bladder outlet obstruction. Electronically Signed: By: Anner Crete M.D. On: 01/11/2016 22:26   Dg Chest Port 1 View  02/04/2016  CLINICAL DATA:  Status post aorto mesenteric aneurysm repair, follow-up line placement. History of COPD EXAM: PORTABLE CHEST 1 VIEW COMPARISON:  Chest x-ray dated Jan 13, 2016 FINDINGS: There is chronic elevation of the right hemidiaphragm. There is air under the hemidiaphragm today. There is density in the inferior aspect of the right upper lobe. The left lung is clear. There is no pneumothorax or significant pleural effusion. The cardiac silhouette is top-normal in size but stable. The pulmonary vascularity is normal. There is an esophagogastric tube present whose tip projects below the inferior margin of the image. A right internal  jugular Cordis sheath tip projects over the proximal SVC. There is kinking of this Cordis sheath as it enters the skin. IMPRESSION: 1. Subsegmental atelectasis or Allani Reber infiltrate inferiorly in the right upper lobe. No pneumothorax.  Small amount of pneumoperitoneum visible under the right hemidiaphram. 2. Esophagogastric tube tip projects below the inferior margin of the image but is seen to lie in the mid gastric body on the accompanying portable abdominal exam. Electronically Signed   By: David  Martinique M.D.   On: 02/04/2016 13:58   Dg Chest Port 1 View  01/13/2016  CLINICAL DATA:  R06.02 (ICD-10-CM) - SOB (shortness of breath)COPD (chronic obstructive pulmonary disease) ([place]) J44.9Smokes 1ppd/60 yrs EXAM: PORTABLE CHEST 1 VIEW COMPARISON:  Chest CT, 07/23/2015.  Chest radiograph, 02/06/2015. FINDINGS: There is new opacity at the right lung base when compared to the prior studies silhouetting the right hemidiaphragm. Mild opacities noted in the medial left lung base. Remainder of the lungs is clear. Left lung is hyperexpanded. No pneumothorax. Cardiac silhouette is normal in size. No mediastinal or hilar masses or convincing adenopathy. Bony thorax is demineralized but grossly intact. IMPRESSION: 1. New bilateral lung base opacity, greater on the right. This may be atelectasis, pneumonia or a combination. A central obstructing lesion is not excluded. Consider followup chest CT with contrast for further assessment. 2. No evidence of pulmonary edema. Electronically Signed   By: Lajean Manes M.D.   On: 01/13/2016 11:21   Dg Abd Acute W/chest  01/25/2016  CLINICAL DATA:  Diarrhea.  Abdominal pain.  Weakness. EXAM: DG ABDOMEN ACUTE W/ 1V CHEST COMPARISON:  01/13/2016. FINDINGS: Borderline enlarged cardiac silhouette. Clear lungs. Normal bowel gas pattern without free peritoneal air. Cholecystectomy clips. 6 mm nonspecific oval calcification overlying the right lower abdomen medially. Marked dextroconvex lumbar rotary scoliosis and degenerative changes. Mild to moderate thoracic scoliosis and degenerative changes. Mild left shoulder degenerative changes. Diffuse osteopenia. Atheromatous arterial calcifications. IMPRESSION: 1. No acute  abnormality. 2. 6 mm oval calcification overlying the right lower abdomen, medially. This could potentially represent a ureteral calculus or phlebolith. An appendicolith is less likely. 3. Thoracolumbar scoliosis and degenerative changes. Electronically Signed   By: Claudie Revering M.D.   On: 01/25/2016 10:18   Dg Abd Portable 1v  02/04/2016  CLINICAL DATA:  Status post abdominal aortic -mesenteric aneurysm repair, NG tube placement. EXAM: PORTABLE ABDOMEN - 1 VIEW COMPARISON:  Chest x-ray of today's date and CT scan of the abdomen of Jan 25 2016 FINDINGS: There is extraluminal gas in the right upper quadrant of the abdomen. This was seen on the accompanying chest x-ray as well. The esophagogastric tube tip lies in the mid gastric body. There is a moderate amount of gas and stool in the colon and rectum. There is a small amount of small bowel gas as well. There is stable severe dextrocurvature centered in the mid lumbar spine. IMPRESSION: Extraluminal gas within the abdominal cavity consistent with the postoperative state. The nasogastric tube is in reasonable position. Electronically Signed   By: David  Martinique M.D.   On: 02/04/2016 14:01   Ct Angio Abd/pel W/ And/or W/o  01/30/2016  ADDENDUM REPORT: 01/30/2016 14:55 ADDENDUM: 5 x 6 mm ureteral stone on the right just above the right iliac artery causing moderate hydronephrosis of the right kidney. Additional small bilateral renal calculi bilaterally. On the CT of 01/25/2016, the stone was in the proximal right ureter not causing significant obstruction. Electronically Signed   By: Franchot Gallo M.D.   On:  01/30/2016 14:55  01/30/2016  CLINICAL DATA:  75 year old male with generalized abdominal pain and recent GI bleed. Evaluate for mesenteric ischemia. EXAM: CTA ABDOMEN AND PELVIS wITHOUT AND WITH CONTRAST TECHNIQUE: Multidetector CT imaging of the abdomen and pelvis was performed using the standard protocol during bolus administration of intravenous contrast.  Multiplanar reconstructed images and MIPs were obtained and reviewed to evaluate the vascular anatomy. CONTRAST:  100 mL Isovue 370 COMPARISON:  Prior CTA abdomen and pelvis 01/25/2016 FINDINGS: VASCULAR Aorta: Surgical changes of prior tube graft repair of reported abdominal aortic aneurysm. There is focal narrowing of the aortic lumen in the region of the proximal anastomosis. The aortic lumen measures only 12 mm at this location in there is extensive peripheral calcification. No evidence of aneurysm or pseudoaneurysm. Celiac: Heavily diseased origin the celiac artery secondary to bulky calcified atherosclerotic plaque. There is either high-grade stenosis or focal occlusion of the origin of the celiac artery. The splenic artery is relatively small in caliber. Conventional hepatic arterial anatomy. SMA: Approximately 1.5 cm occlusion of the origin of the superior mesenteric artery. There is bulky calcified plaque surrounding the origin. Renals: Solitary bilateral renal arteries. Predominately calcified plaque results in at least moderate stenosis of the left renal artery origin. Heterogeneous fibro fatty and calcified plaque results in mild narrowing of the right renal artery origin. No renal artery aneurysm or fibromuscular dysplasia. IMA: The origin has been ligated. The distal branches opacified via retrograde flow. Inflow: Relatively high-grade focal stenoses of the most proximal bilateral iliac arteries secondary to bulky calcified plaque. Mild and possibly poststenotic aneurysmal dilatation of the iliac arteries measuring up to 1.9 cm on the right and 1.6 cm on the left. The bilateral internal and external iliac arteries remain patent. Proximal Outflow: Bulky calcified atherosclerotic plaque along the posterior wall of the common femoral arteries bilaterally. The visualized proximal profunda and superficial femoral arteries remain patent. Veins: No focal venous abnormality. NON-VASCULAR Lower Chest: Small  bilateral layering pleural effusions. There is associated lower lobe atelectasis mild lower lobe bronchial wall thickening. Cardiomegaly. Atherosclerotic calcifications noted along the visualized coronary arteries. No pericardial effusion. Unremarkable distal thoracic esophagus. Abdomen: Unremarkable CT appearance of the pancreas, spleen, and adrenal glands for age. Grossly unremarkable stomach and duodenum. No evidence of obstruction or focal bowel wall thickening. No focal pneumatosis or free air. Normal hepatic contour and morphology. No discrete hepatic lesions. The gallbladder is surgically absent. No intra or extrahepatic biliary ductal dilatation. Mild periportal edema. No evidence of portal venous gas. Diffuse mild mesenteric edema.  Trace free fluid. Pelvis: Foley catheter is noted within the bladder. The bladder wall appears somewhat thickened although evaluation is limited by underdistention. Bones/Soft Tissues: Mild anasarca. No acute fracture or aggressive appearing lytic or blastic osseous lesion. Degenerative dextro convex scoliosis centered at L1-L2. Review of the MIP images confirms the above findings. IMPRESSION: VASCULAR 1. Severe and largely heavily calcified vascular disease with high-grade stenosis versus occlusion of the celiac artery, occlusion of the origin of the superior mesenteric artery and surgical ligation of the inferior mesenteric artery. These findings are highly consistent with a chronic mesenteric ischemia affecting the entirety of the gastrointestinal tract. Nearly all flow to the gastrointestinal tract may be via collateral pathways. 2. Surgical changes of prior infrarenal abdominal aortic repair with aortic tube graft extending from the infrarenal aorta to just proximal to the aortic bifurcation. 3. Focal moderate calcified stenosis of the infrarenal abdominal aorta in the region of the proximal tube graft anastomosis. 4.  Focal high-grade calcified stenoses of the bilateral  common iliac artery origins followed by poststenotic dilatation as described above. 5. Bulky calcified atherosclerotic plaque in both common femoral arteries resulting in mild to moderate stenosis. 6. Primarily fibro fatty plaque results in a mild narrowing of the right renal artery origin. 7. Calcified plaque results in moderate narrowing of the left renal artery origin. NON VASCULAR 1. No evidence of pneumatosis, portal venous air, or free air. 2. No evidence of obstruction or focal bowel wall thickening. 3. Bilateral pleural effusions, periportal edema, anasarca, small volume ascites and mesenteric edema all consistent with volume overload versus CHF or third-spacing. 4. Bilateral lower lobe atelectasis and bronchial wall thickening. 5. Additional ancillary findings as above without significant interval change. Signed, Criselda Peaches, MD [facility] Electronically Signed: By: Jacqulynn Cadet M.D. On: 01/29/2016 11:35   Anti-infectives: Anti-infectives    Start     Dose/Rate Route Frequency Ordered Stop   02/04/16 1700  cefUROXime (ZINACEF) 1.5 g in dextrose 5 % 50 mL IVPB     1.5 g 100 mL/hr over 30 Minutes Intravenous Every 12 hours 02/04/16 1459 02/05/16 1659   02/04/16 1500  cefUROXime (ZINACEF) 1.5 g in dextrose 5 % 50 mL IVPB  Status:  Discontinued     1.5 g 100 mL/hr over 30 Minutes Intravenous Every 12 hours 02/04/16 1450 02/04/16 1459   02/04/16 0600  cefUROXime (ZINACEF) 1.5 g in dextrose 5 % 50 mL IVPB     1.5 g 100 mL/hr over 30 Minutes Intravenous On call to O.R. 02/03/16 1123 02/04/16 0906   01/25/16 1830  vancomycin (VANCOCIN) 50 mg/mL oral solution 125 mg  Status:  Discontinued     125 mg Oral Every 6 hours 01/25/16 1828 01/30/16 1355      Assessment/Plan: s/p Procedure(s): AORTO-SUPERIOR MESENTERIC ARTERY BYPASS GRAFT USING 6MM X 30 CM HEMASHIELD GOLD GRAFT (N/A) Stable postop   LOS: 10 days   Curt Jews 02/04/2016, 5:36 PM

## 2016-02-04 NOTE — Anesthesia Procedure Notes (Addendum)
Central Venous Catheter Insertion Performed by: anesthesiologist Patient location: Pre-op. Preanesthetic checklist: patient identified, IV checked, site marked, risks and benefits discussed, surgical consent, monitors and equipment checked, pre-op evaluation, timeout performed and anesthesia consent Position: Trendelenburg Lidocaine 1% used for infiltration Landmarks identified and Seldinger technique used Catheter size: 8.5 Fr Central line was placed.Sheath introducer Procedure performed using ultrasound guided technique. Attempts: 1 Following insertion, line sutured, dressing applied and Biopatch. Post procedure assessment: blood return through all ports, free fluid flow and no air. Patient tolerated the procedure well with no immediate complications.    Procedure Name: Intubation Date/Time: 02/04/2016 8:53 AM Performed by: Ollen Bowl Pre-anesthesia Checklist: Patient identified, Emergency Drugs available, Suction available, Patient being monitored and Timeout performed Patient Re-evaluated:Patient Re-evaluated prior to inductionOxygen Delivery Method: Simple face mask and Nasal cannula Preoxygenation: Pre-oxygenation with 100% oxygen Intubation Type: IV induction Ventilation: Oral airway inserted - appropriate to patient size and Mask ventilation without difficulty Laryngoscope Size: Mac and 3 Grade View: Grade I Tube type: Oral Tube size: 8.0 mm Number of attempts: 1 Airway Equipment and Method: Stylet Placement Confirmation: positive ETCO2,  breath sounds checked- equal and bilateral and ETT inserted through vocal cords under direct vision Secured at: 23 cm Tube secured with: Tape Dental Injury: Teeth and Oropharynx as per pre-operative assessment

## 2016-02-04 NOTE — Progress Notes (Signed)
ANTICOAGULATION CONSULT NOTE - Follow Up Consult  Pharmacy Consult for heparin Indication: atrial fibrillation and mesenteric ischemia with SMA occlusion  Labs:  Recent Labs  02/01/16 0555 02/02/16 0400 02/03/16 0701 02/03/16 1632 02/04/16 0206  HGB 8.5* 8.6* 9.6*  --  9.7*  HCT 27.2* 28.5* 32.2*  --  31.6*  PLT 226 223 225  --  236  APTT  --  33  --   --   --   LABPROT  --  16.7*  --   --  16.9*  INR  --  1.34  --   --  1.36  HEPARINUNFRC  --   --  <0.10* 0.24* 0.23*  CREATININE 0.42* 0.48* 0.50*  --   --     Assessment: 74yo male remains subtherapeutic on heparin with lower level despite rate increase, no gtt issues per RN.  Goal of Therapy:  Heparin level 0.3-0.5 units/ml   Plan:  Will increase heparin gtt by 3 units/kg/hr to 1250 units/hr and check level in [place], PharmD, BCPS  02/04/2016,3:07 AM

## 2016-02-05 ENCOUNTER — Encounter (HOSPITAL_COMMUNITY): Payer: Self-pay | Admitting: Vascular Surgery

## 2016-02-05 ENCOUNTER — Inpatient Hospital Stay (HOSPITAL_COMMUNITY): Payer: Commercial Managed Care - HMO

## 2016-02-05 LAB — COMPREHENSIVE METABOLIC PANEL
ALBUMIN: 2.1 g/dL — AB (ref 3.5–5.0)
ALK PHOS: 91 U/L (ref 38–126)
ALT: 12 U/L — AB (ref 17–63)
AST: 16 U/L (ref 15–41)
Anion gap: 3 — ABNORMAL LOW (ref 5–15)
BUN: 5 mg/dL — ABNORMAL LOW (ref 6–20)
CHLORIDE: 102 mmol/L (ref 101–111)
CO2: 29 mmol/L (ref 22–32)
Calcium: 7.6 mg/dL — ABNORMAL LOW (ref 8.9–10.3)
Creatinine, Ser: 0.48 mg/dL — ABNORMAL LOW (ref 0.61–1.24)
GFR calc Af Amer: >60 mL/min (ref >60)
GFR calc non Af Amer: >60 mL/min (ref >60)
GLUCOSE: 129 mg/dL — AB (ref 65–99)
Potassium: 4 mmol/L (ref 3.5–5.1)
SODIUM: 134 mmol/L — AB (ref 135–145)
Total Bilirubin: 0.6 mg/dL (ref 0.3–1.2)
Total Protein: 4.3 g/dL — ABNORMAL LOW (ref 6.5–8.1)

## 2016-02-05 LAB — CBC
HCT: 30.2 % — ABNORMAL LOW (ref 39.0–52.0)
Hemoglobin: 9.2 g/dL — ABNORMAL LOW (ref 13.0–17.0)
MCH: 25.8 pg — ABNORMAL LOW (ref 26.0–34.0)
MCHC: 30.5 g/dL (ref 30.0–36.0)
MCV: 84.6 fL (ref 78.0–100.0)
Platelets: 198 10*3/uL (ref 150–400)
RBC: 3.57 MIL/uL — ABNORMAL LOW (ref 4.22–5.81)
RDW: 15.3 % (ref 11.5–15.5)
WBC: 17.4 10*3/uL — ABNORMAL HIGH (ref 4.0–10.5)

## 2016-02-05 LAB — HEPARIN LEVEL (UNFRACTIONATED): Heparin Unfractionated: 0.33 IU/mL (ref 0.30–0.70)

## 2016-02-05 LAB — MAGNESIUM: Magnesium: 2 mg/dL (ref 1.7–2.4)

## 2016-02-05 LAB — AMYLASE: Amylase: 199 U/L — ABNORMAL HIGH (ref 28–100)

## 2016-02-05 NOTE — Care Management Note (Signed)
Case Management Note  Patient Details  Name: Jaymarion Chase MRN: [id number] Date of Birth: Nov 12, 1940  Subjective/Objective:     Pt admitted with mesenteric ischemia               Action/Plan:  PTA - independent from home with wife.  CM will continue to follow for discharge needs   Expected Discharge Date:                  Expected Discharge Plan:  [place]  In-House Referral:  NA  Discharge planning Services  CM Consult  Post Acute Care Choice:  NA Choice offered to:  NA  DME Arranged:    DME Agency:     HH Arranged:    HH Agency:     Status of Service:  In process, will continue to follow  Medicare Important Message Given:  Yes Date Medicare IM Given:    Medicare IM give by:    Date Additional Medicare IM Given:    Additional Medicare Important Message give by:     If discussed at [place] of Stay Meetings, dates discussed:    Additional Comments:  Maryclare Labrador, RN 02/05/2016, 3:26 PM

## 2016-02-05 NOTE — Progress Notes (Addendum)
[facility] [place]  Subjective  - Doing well.  Very glad the surgery is over.   Objective 120/76 73 98.1 F (36.7 C) (Oral) 18 97%  Intake/Output Summary (Last 24 hours) at 02/05/16 [id number] Last data filed at 02/05/16 0700  Gross per 24 hour  Intake 5135.42 ml  Output   2745 ml  Net 2390.42 ml    Grip 5/5 bilaterally Abdomin with hypo bowel sounds Abdomin soft Palpable DP bilaterally Heart A fib   Lungs non labored breathing BP stable 120/76  Assessment/Planning: POD #1 AORTO-SUPERIOR MESENTERIC ARTERY BYPASS GRAFT USING 6MM X 30 CM HEMASHIELD GOLD GRAFT (N/A)  NG tube out put 50 cc over night Hypo bowel sound to auscultation Abdomin soft  Encourage IS Out of bed to chair ambulate as tolerates  Pending plan per Dr. Donnetta Hutching we may d/c the the NG tube today and possibly the foley. Urine out put good Maintain NPO  Re start heparin today    Laurence Slate [facility] 02/05/2016 7:28 AM --  Laboratory Lab Results:  Recent Labs  02/04/16 1513 02/05/16 0422  WBC 24.2* 17.4*  HGB 8.9* 9.2*  HCT 29.3* 30.2*  PLT 194 198   BMET  Recent Labs  02/04/16 1300 02/05/16 0422  NA 135 134*  K 3.8 4.0  CL 102 102  CO2 27 29  GLUCOSE 138* 129*  BUN 7 5*  CREATININE 0.59* 0.48*  CALCIUM 7.7* 7.6*    COAG Lab Results  Component Value Date   INR 1.41 02/04/2016   INR 1.36 02/04/2016   INR 1.34 02/02/2016   No results found for: PTT    I have examined the patient, reviewed and agree with above.Looks great. Up in chair. Wife with the patient in his room. Mild soreness. Minimal abdominal tenderness appropriate for surgery yesterday. Palpable femoral pulses. Minimal output NG therefore will DC. Potentially "clear liquids tomorrow. Plan DC Foley tomorrow. Okay to transfer to step  Curt Jews, MD 02/05/2016 10:28 AM

## 2016-02-05 NOTE — Progress Notes (Signed)
Physical Therapy Treatment Patient Details Name: Steve Gomez MRN: [id number] DOB: [birth year] Today's Date: 02/05/2016    History of Present Illness History of Present Illness: 75 y/o male admitted to [facility] with chief complaints of abdominal pain and rectal bleeding. He has a history of AAA repair 20 years ago, COPD, Hypertension, And A fib.  Pt was found to have mesenteric ischemia.  He underwent mesenteric artery repair 02/04/16    PT Comments    Pt admitted with above diagnosis. Pt currently with functional limitations due to balance and endurance deficits. Pt was able to ambulate with RW with fairly good safety. May need wife initially guarding pt at home for safety.  Wife agrees.  Will continue acute PT.   Pt will benefit from skilled PT to increase their independence and safety with mobility to allow discharge to the venue listed below.    Follow Up Recommendations  Home health PT     Equipment Recommendations  Rolling walker with 5" wheels;3in1 (PT)    Recommendations for Other Services       Precautions / Restrictions Precautions Precautions: Fall Restrictions Weight Bearing Restrictions: No    Mobility  Bed Mobility Overal bed mobility: Needs Assistance Bed Mobility: Sit to Supine       Sit to supine: Min assist   General bed mobility comments: min A to lift LEs into bed   Transfers Overall transfer level: Needs assistance Equipment used: Rolling walker (2 wheeled) Transfers: Sit to/from Stand Sit to Stand: Min assist Stand pivot transfers: Min assist       General transfer comment: min A to steady.  Pt with posterior lean   Ambulation/Gait Ambulation/Gait assistance: Min guard;Min assist Ambulation Distance (Feet): 280 Feet Assistive device: Rolling walker (2 wheeled) Gait Pattern/deviations: Step-through pattern;Decreased stride length;Drifts right/left   Gait velocity interpretation: Below normal speed for age/gender General Gait Details: Pt  needs cues to stay close to RW.  Cues for sequencing and maneuvering RW as well.  Wife and pt feel a RW may be needed at home.     Stairs            Wheelchair Mobility    Modified Rankin (Stroke Patients Only)       Balance Overall balance assessment: Needs assistance Sitting-balance support: No upper extremity supported;Feet supported Sitting balance-Leahy Scale: Fair     Standing balance support: Bilateral upper extremity supported;During functional activity Standing balance-Leahy Scale: Poor Standing balance comment: requires min assist with posterior lean at times.                     Cognition Arousal/Alertness: Awake/alert Behavior During Therapy: WFL for tasks assessed/performed Overall Cognitive Status: Within Functional Limits for tasks assessed                      Exercises      General Comments General comments (skin integrity, edema, etc.): VSS      Pertinent Vitals/Pain Pain Assessment: Faces Faces Pain Scale: Hurts a little bit Pain Location: surgical site Pain Descriptors / Indicators: Operative site guarding Pain Intervention(s): Limited activity within patient's tolerance;Monitored during session;Repositioned  VSS    Home Living Family/patient expects to be discharged to:: Private residence Living Arrangements: Spouse/significant other Available Help at Discharge: Family;Available 24 hours/day Type of Home: House Home Access: Stairs to enter Entrance Stairs-Rails: Right Home Layout: One level Home Equipment: None      Prior Function Level of Independence: Independent  Comments: Pt was independent until ~ one month PTA, and has required progressive assistance from spouse    PT Goals (current goals can now be found in the care plan section) Acute Rehab PT Goals Patient Stated Goal: return home Progress towards PT goals: Progressing toward goals    Frequency  Min 3X/week    PT Plan Current plan remains  appropriate    Co-evaluation PT/OT/SLP Co-Evaluation/Treatment: Yes Reason for Co-Treatment: For patient/therapist safety PT goals addressed during session: Mobility/safety with mobility OT goals addressed during session: ADL's and self-care     End of Session Equipment Utilized During Treatment: Gait belt Activity Tolerance: Patient tolerated treatment well Patient left: in bed;with call bell/phone within reach;with family/visitor present     Time: 1213-1232 PT Time Calculation (min) (ACUTE ONLY): 19 min  Charges:  $Gait Training: 8-22 mins                    G CodesDenice Paradise 02-17-2016, 2:14 PM [place] Laurencia Roma,PT Acute Rehabilitation [phone] (pager)

## 2016-02-05 NOTE — Progress Notes (Signed)
      I called in patient pharmacy and they are going to convert amiodarone to IV until he is able to tolerate PO's again.   [name] MAUREEN PA-C

## 2016-02-05 NOTE — Evaluation (Signed)
Occupational Therapy Evaluation Patient Details Name: Steve Gomez MRN: [id number] DOB: 1940/11/29 Today's Date: 02/05/2016    History of Present Illness History of Present Illness: 75 y/o male admitted to [facility] with chief complaints of abdominal pain and rectal bleeding. He has a history of AAA repair 20 years ago, COPD, Hypertension, And A fib.  Pt was found to have mesenteric ischemia.  He underwent mesenteric artery repair 02/04/16   Clinical Impression   Pt admitted with above. He demonstrates the below listed deficits and will benefit from continued OT to maximize safety and independence with BADLs.  Pt presents to OT with generalized weakness, impaired balance, and pain.  He currently requires min A - mod A for ADLs and min A for functional mobility.  Anticipate good progress.  Wife is very supportive and able to assist at discharge.  Recommend HHOT       Follow Up Recommendations  Supervision/Assistance - 24 hour;Home health OT    Equipment Recommendations  Tub/shower seat    Recommendations for Other Services       Precautions / Restrictions Precautions Precautions: Fall Restrictions Weight Bearing Restrictions: No      Mobility Bed Mobility Overal bed mobility: Needs Assistance Bed Mobility: Sit to Supine       Sit to supine: Min assist   General bed mobility comments: min A to lift LEs into bed   Transfers Overall transfer level: Needs assistance Equipment used: Rolling walker (2 wheeled) Transfers: Sit to/from [facility] Sit to Stand: Min assist Stand pivot transfers: Min assist       General transfer comment: min A to steady.  Pt with posterior lean     Balance Overall balance assessment: Needs assistance Sitting-balance support: Feet supported Sitting balance-Leahy Scale: Fair     Standing balance support: Bilateral upper extremity supported Standing balance-Leahy Scale: Poor Standing balance comment: requires min A.   Demonstrates posterior lean                             ADL Overall ADL's : Needs assistance/impaired Eating/Feeding: NPO   Grooming: Wash/dry hands;Wash/dry face;Oral care;Brushing hair;Minimal assistance;Standing   Upper Body Bathing: Minimal assitance;Sitting   Lower Body Bathing: Moderate assistance;Sit to/from stand   Upper Body Dressing : Moderate assistance;Sitting   Lower Body Dressing: Maximal assistance;Sit to/from stand   Toilet Transfer: Minimal assistance;Ambulation;Comfort height toilet;Regular Toilet;Grab bars;RW   Toileting- Clothing Manipulation and Hygiene: Moderate assistance;Sit to/from stand       Functional mobility during ADLs: +2 for safety/equipment;Minimal assistance;Rolling walker General ADL Comments: Pt very motivated.  Limitied by pain, and fatigue      Vision     Perception     Praxis      Pertinent Vitals/Pain Pain Assessment: Faces Faces Pain Scale: Hurts a little bit Pain Location: surgical site  Pain Descriptors / Indicators: Operative site guarding Pain Intervention(s): Monitored during session     Hand Dominance Right   Extremity/Trunk Assessment Upper Extremity Assessment Upper Extremity Assessment: Generalized weakness   Lower Extremity Assessment Lower Extremity Assessment: Defer to PT evaluation   Cervical / Trunk Assessment Cervical / Trunk Assessment: Kyphotic   Communication Communication Communication: No difficulties;Other (comment) (difficult to understand )   Cognition Arousal/Alertness: Awake/alert Behavior During Therapy: WFL for tasks assessed/performed                       General Comments  Exercises       Shoulder Instructions      Home Living Family/patient expects to be discharged to:: Private residence Living Arrangements: Spouse/significant other Available Help at Discharge: Family;Available 24 hours/day Type of Home: House Home Access: Stairs to enter [employer] of Steps: 3-4 Entrance Stairs-Rails: Right Home Layout: One level     Bathroom Shower/Tub: Occupational psychologist: Standard     Home Equipment: None          Prior Functioning/Environment Level of Independence: Independent        Comments: Pt was independent until ~ one month PTA, and has required progressive assistance from spouse     OT Diagnosis: Generalized weakness;Acute pain   OT Problem List: Decreased strength;Decreased activity tolerance;Impaired balance (sitting and/or standing);Decreased safety awareness;Decreased knowledge of use of DME or AE;Decreased knowledge of precautions;Cardiopulmonary status limiting activity;Pain   OT Treatment/Interventions: Self-care/ADL training;Therapeutic exercise;DME and/or AE instruction;Therapeutic activities;Patient/family education;Balance training    OT Goals(Current goals can be found in the care plan section) Acute Rehab OT Goals Patient Stated Goal: return home OT Goal Formulation: With patient/family Time For Goal Achievement: 02/19/16 Potential to Achieve Goals: Good ADL Goals Pt Will Perform Grooming: with supervision;standing Pt Will Perform Upper Body Bathing: with set-up;sitting Pt Will Perform Lower Body Bathing: with supervision;sit to/from stand Pt Will Perform Upper Body Dressing: with set-up;sitting Pt Will Perform Lower Body Dressing: with supervision;sit to/from stand Pt Will Transfer to Toilet: with set-up;ambulating;regular height toilet;bedside commode;grab bars Pt Will Perform Toileting - Clothing Manipulation and hygiene: with supervision;sit to/from stand Pt Will Perform Tub/Shower Transfer: Shower transfer;with min guard assist;ambulating;shower seat;rolling walker  OT Frequency: Min 2X/week   Barriers to D/C:            Co-evaluation PT/OT/SLP Co-Evaluation/Treatment: Yes Reason for Co-Treatment: For patient/therapist safety   OT goals addressed during session: ADL's  and self-care      End of Session Equipment Utilized During Treatment: Rolling walker Nurse Communication: Mobility status  Activity Tolerance: Patient tolerated treatment well Patient left: in bed;with call bell/phone within reach;with family/visitor present   Time: [id number] OT Time Calculation (min): 17 min Charges:  OT General Charges $OT Visit: 1 Procedure OT Evaluation $OT Eval Moderate Complexity: 1 Procedure G-Codes:    Seleny Allbright M February 11, 2016, 12:52 PM

## 2016-02-05 NOTE — Consult Note (Signed)
   [facility] CM Inpatient Consult   02/05/2016  Aum Teer February 12, 1941 [id number]   Patient is currently active with [facility] for chronic disease management services.  Patient has been engaged by a [employer] and LCSW.  Our community based plan of care has focused on disease management and community resource support.  Patient will receive a post discharge transition of care call and will be evaluated for monthly home visits for assessments and disease process education.  Made Inpatient Case Manager aware that [facility] following.  Of note, [facility] Care Management services does not replace or interfere with any services that are arranged by inpatient case management or social work.    For additional questions or referrals please contact:  Royetta Crochet. [name], RN, BSN, [place] [phone]

## 2016-02-05 NOTE — Progress Notes (Signed)
Pharmacy Medication Note regarding Amiodarone PO to IV conversion Pharmacy was asked to convert amiodarone from PO to IV as patient is not tolerating PO's a this time. Per discussion with vascular PA, patient will likely be able to tolerate orals in 24-48 hours. Amiodarone has a very long half-life.   Due to the limited anticipated time off of amiodarone and long half-life of amiodarone, will plan to just hold oral doses as needed. If at 24 hours, it is not anticipated that the patient will be able to take PO meds for a greater length of time, would recommend restarting IV amiodarone at that time.  Please call pharmacy with questions. Thanks!  Salome Arnt, PharmD, BCPS 02/05/2016 11:20 AM

## 2016-02-05 NOTE — Progress Notes (Signed)
ANTICOAGULATION CONSULT NOTE - Follow Up Consult  Pharmacy Consult for Heparin Indication: atrial fibrillation and mesenteric ischemia with SMA occlusion  No Known Allergies  Patient Measurements: Height: 5\' 4"  (162.6 cm) Weight: 112 lb 12.8 oz (51.166 kg) IBW/kg (Calculated) : 59.2 Heparin Dosing Weight: 51 kg  Vital Signs: Temp: 99.7 F (37.6 C) (06/08 1941) Temp Source: Oral (06/08 1941) BP: 110/63 mmHg (06/08 2200) Pulse Rate: 53 (06/08 2200)  Labs:  Recent Labs  02/04/16 0206 02/04/16 1049 02/04/16 1300 02/04/16 1513 02/05/16 0422 02/05/16 2205  HGB 9.7* 8.5*  --  8.9* 9.2*  --   HCT 31.6* 25.0*  --  29.3* 30.2*  --   PLT 236  --   --  194 198  --   APTT  --   --   --  32  --   --   LABPROT 16.9*  --   --  17.3*  --   --   INR 1.36  --   --  1.41  --   --   HEPARINUNFRC 0.23*  --  <0.10*  --   --  0.33  CREATININE 0.59*  --  0.59*  --  0.48*  --     Estimated Creatinine Clearance: 58.7 mL/min (by C-G formula based on Cr of 0.48).  Assessment: AC: Heparin for hx afib, new SMA occlustion - stopped for surgery 6/7 and resumed this afternoon. Heparin level 0.33 in goal range at previous rate.  Goal of Therapy:  Heparin level 0.3-0.7 units/ml Monitor platelets by anticoagulation protocol: Yes   Plan:  Continue IV heparin at 1250 units/hr Daily HL and CBC    Offie Waide S. Alford Highland, PharmD, BCPS Clinical Staff Pharmacist Pager [phone]  Eilene Ghazi Stillinger 02/05/2016,10:24 PM

## 2016-02-06 LAB — BASIC METABOLIC PANEL
ANION GAP: 4 — AB (ref 5–15)
BUN: <5 mg/dL — ABNORMAL LOW (ref 6–20)
CO2: 29 mmol/L (ref 22–32)
Calcium: 7.8 mg/dL — ABNORMAL LOW (ref 8.9–10.3)
Chloride: 101 mmol/L (ref 101–111)
Creatinine, Ser: 0.55 mg/dL — ABNORMAL LOW (ref 0.61–1.24)
GFR calc Af Amer: >60 mL/min (ref >60)
Glucose, Bld: 118 mg/dL — ABNORMAL HIGH (ref 65–99)
POTASSIUM: 4 mmol/L (ref 3.5–5.1)
SODIUM: 134 mmol/L — AB (ref 135–145)

## 2016-02-06 LAB — CBC
HCT: 26.8 % — ABNORMAL LOW (ref 39.0–52.0)
HEMOGLOBIN: 8.2 g/dL — AB (ref 13.0–17.0)
MCH: 26 pg (ref 26.0–34.0)
MCHC: 30.6 g/dL (ref 30.0–36.0)
MCV: 85.1 fL (ref 78.0–100.0)
Platelets: 187 10*3/uL (ref 150–400)
RBC: 3.15 MIL/uL — AB (ref 4.22–5.81)
RDW: 15.4 % (ref 11.5–15.5)
WBC: 12.8 10*3/uL — ABNORMAL HIGH (ref 4.0–10.5)

## 2016-02-06 LAB — HEPARIN LEVEL (UNFRACTIONATED)
HEPARIN UNFRACTIONATED: 0.28 [IU]/mL — AB (ref 0.30–0.70)
HEPARIN UNFRACTIONATED: 0.6 [IU]/mL (ref 0.30–0.70)

## 2016-02-06 LAB — MAGNESIUM: MAGNESIUM: 1.8 mg/dL (ref 1.7–2.4)

## 2016-02-06 NOTE — Progress Notes (Signed)
02/06/2016 9:16 AM Nursing note Noted order to d/c foley catheter. Per patient, foley placed by urology on 5/20 due to inability to void. Pt. Was d/c home with current catheter at that time. Present foley was in on admission to hospital 5/28. Pt. Was on flomax at home, not currently ordered. Dr. Trula Slade paged and made aware. Verbal orders to leave foley in at this time and defer to Hospitalist team for foley management. Orders enacted. Hospitalist team made aware. Will await further orders.  Tavonna Worthington, Arville Lime

## 2016-02-06 NOTE — Progress Notes (Signed)
Pt has transferred to 2W from 2S. Telemetry box has been applied and CCMD has been notified. Pt is resting in bed with call light within reach. Pt and pt's wife have been educated on enteric precautions. Pt denies needs at this time. Will continue to monitor.   Grant Fontana RN, BSN

## 2016-02-06 NOTE — Progress Notes (Addendum)
ANTICOAGULATION CONSULT NOTE - Follow Up Consult  Pharmacy Consult for Heparin Indication: atrial fibrillation and mesenteric ischemia with SMA occlusion  No Known Allergies  Patient Measurements: Height: 5\' 4"  (162.6 cm) Weight: 111 lb 1.8 oz (50.4 kg) IBW/kg (Calculated) : 59.2 Heparin Dosing Weight: 51 kg  Vital Signs: Temp: 98.4 F (36.9 C) (06/09 0400) Temp Source: Oral (06/09 0400) BP: 108/69 mmHg (06/09 0700) Pulse Rate: 82 (06/09 0700)  Labs:  Recent Labs  02/04/16 0206  02/04/16 1300 02/04/16 1513 02/05/16 0422 02/05/16 2205 02/06/16 0514 02/06/16 0516  HGB 9.7*  < >  --  8.9* 9.2*  --  8.2*  --   HCT 31.6*  < >  --  29.3* 30.2*  --  26.8*  --   PLT 236  --   --  194 198  --  187  --   APTT  --   --   --  32  --   --   --   --   LABPROT 16.9*  --   --  17.3*  --   --   --   --   INR 1.36  --   --  1.41  --   --   --   --   HEPARINUNFRC 0.23*  --  <0.10*  --   --  0.33  --  0.28*  CREATININE 0.59*  --  0.59*  --  0.48*  --  0.55*  --   < > = values in this interval not displayed.  Estimated Creatinine Clearance: 57.8 mL/min (by C-G formula based on Cr of 0.55).  Assessment: AC: Heparin for hx afib, new SMA occlustion - s/p SMA bypass on 6/7. HL just below goal at 0.28 this morning. No bleeding noted, no problems with infusion or bleeding per RN. Plan to switch to Xarelto or warfarin per [facility]VS.  Goal of Therapy:  Heparin level 0.3-0.5 units/ml Monitor platelets by anticoagulation protocol: Yes   Plan:  Increase heparin drip to 1350 units/hr 6 hr HL Daily HL and CBC Monitor for s/sx of bleeding   [facility], [place].D., BCPS Clinical Pharmacist Pager: [phone] 02/06/2016 8:20 AM   Addendum: HL is therapeutic for normal goal at 0.6 but trying to keep in a lower range.  Decrease heparin drip to 1300 units/hr Daily HL and [place], Pharm.D., BCPS Clinical Pharmacist Pager: [phone] 02/06/2016 3:52 PM

## 2016-02-06 NOTE — Care Management Note (Addendum)
Case Management Note  Patient Details  Name: Steve Gomez MRN: [id number] Date of Birth: 04/10/1941  Subjective/Objective:     Pt admitted with mesenteric ischemia               Action/Plan:  PTA - independent from home with wife.  Wife will provide recommended supervision at discharge.  CM will continue to follow for discharge needs   Expected Discharge Date:                  Expected Discharge Plan:  [place]  In-House Referral:  NA  Discharge planning Services  CM Consult  Post Acute Care Choice:  NA Choice offered to:  NA  DME Arranged:    DME Agency:     HH Arranged:    HH Agency:     Status of Service:  In process, will continue to follow  Medicare Important Message Given:  Yes Date Medicare IM Given:    Medicare IM give by:    Date Additional Medicare IM Given:    Additional Medicare Important Message give by:     If discussed at [place] of Stay Meetings, dates discussed:    Additional Comments: CM requested via physician sticky tab for HH/DME orders - neither service has been arranged as of yet.  CM provided pt and wife with choice list for recommended services - pt request follow up by CM during weekend to finalize choice.  CM will continue to monitor for disposition needs. Maryclare Labrador, RN 02/06/2016, 10:26 AM

## 2016-02-06 NOTE — Progress Notes (Signed)
Discussed case w/ [facility] team. Pt stable and being transferred out from [place] to telemetry bed. Vascular will continue to see pt in consultation. [facility] will continue pts care as primary team.   Linna Darner, MD [place] 02/06/2016, 1:11 PM

## 2016-02-06 NOTE — Progress Notes (Signed)
Occupational Therapy Treatment Patient Details Name: Steve Gomez MRN: [id number] DOB: Aug 19, 1941 Today's Date: 02/06/2016    History of present illness History of Present Illness: 75 y/o male admitted to [facility] with chief complaints of abdominal pain and rectal bleeding. He has a history of AAA repair 20 years ago, COPD, Hypertension, And A fib.  Pt was found to have mesenteric ischemia.  He underwent mesenteric artery repair 02/04/16   OT comments  Pt making good progress.  Pain limits his ability to perform LB ADL, but as pain improves, he should be able to perform ADLs with min guard to supervision at discharge.  He requires min guard assist for functional mobility.    Follow Up Recommendations  Supervision/Assistance - 24 hour;Home health OT    Equipment Recommendations  Tub/shower seat    Recommendations for Other Services      Precautions / Restrictions Precautions Precautions: Fall       Mobility Bed Mobility Overal bed mobility: Needs Assistance Bed Mobility: Supine to Sit     Supine to sit: Min guard     General bed mobility comments: requires increased time   Transfers Overall transfer level: Needs assistance Equipment used: Rolling walker (2 wheeled) Transfers: Sit to/from [facility] Sit to Stand: Min guard Stand pivot transfers: Min guard            Balance Overall balance assessment: Needs assistance Sitting-balance support: Feet supported Sitting balance-Leahy Scale: Good     Standing balance support: Bilateral upper extremity supported;No upper extremity supported Standing balance-Leahy Scale: Fair                     ADL Overall ADL's : Needs assistance/impaired                     Lower Body Dressing: Moderate assistance;Sit to/from stand Lower Body Dressing Details (indicate cue type and reason): Pt able to doff socks, but required mod A to don, due to pain  Toilet Transfer: Minimal  assistance;Ambulation;Comfort height toilet;Regular Toilet;RW   Toileting- Clothing Manipulation and Hygiene: Minimal assistance;Sit to/from stand         General ADL Comments: Pt continues to be very motivated       Psychologist, clinical During Therapy: [facility] for tasks assessed/performed Overall Cognitive Status: Within Functional Limits for tasks assessed                       Extremity/Trunk Assessment               Exercises     Shoulder Instructions       General Comments      Pertinent Vitals/ Pain       Pain Assessment: Faces Faces Pain Scale: Hurts little more Pain Location: surgical site Pain Descriptors / Indicators: Operative site guarding Pain Intervention(s): Monitored during session;Limited activity within patient's tolerance  Home Living                                          Prior Functioning/Environment              Frequency Min 2X/week     Progress  Toward Goals  OT Goals(current goals can now be found in the care plan section)  Progress towards OT goals: Progressing toward goals  Acute Rehab OT Goals Potential to Achieve Goals: Good ADL Goals Pt Will Perform Grooming: with supervision;standing Pt Will Perform Upper Body Bathing: with set-up;sitting Pt Will Perform Lower Body Bathing: with supervision;sit to/from stand Pt Will Perform Upper Body Dressing: with set-up;sitting Pt Will Perform Lower Body Dressing: with supervision;sit to/from stand Pt Will Transfer to Toilet: with set-up;ambulating;regular height toilet;bedside commode;grab bars Pt Will Perform Toileting - Clothing Manipulation and hygiene: with supervision;sit to/from stand Pt Will Perform Tub/Shower Transfer: Shower transfer;with min guard assist;ambulating;shower seat;rolling walker  Plan Discharge plan remains appropriate    Co-evaluation                 End  of Session Equipment Utilized During Treatment: Rolling walker   Activity Tolerance Patient tolerated treatment well   Patient Left in chair;with call bell/phone within reach;with family/visitor present   Nurse Communication Mobility status        Time: [id number] OT Time Calculation (min): 23 min  Charges: OT General Charges $OT Visit: 1 Procedure OT Treatments $Self Care/Home Management : 8-22 mins $Therapeutic Activity: 8-22 mins  Steve Gomez M 02/06/2016, 1:42 PM

## 2016-02-06 NOTE — Progress Notes (Signed)
    Subjective  - POD #2, s/p SMA bypass   Passing flatus Ambulating without problems   Physical Exam:  Palpable femoral pulses abd soft, non-distended       Assessment/Plan:  POD #2  GI:  Start PO sips liquids GU:  D/c foley Anticoagulation:  On IV heparin, will need to convert to [place] or coumadin Dispo:  Ok to [employer] floor from vascular perspective  Steve Gomez 02/06/2016 7:47 AM --  Filed Vitals:   02/06/16 0600 02/06/16 0700  BP: 113/66 108/69  Pulse: 70 82  Temp:    Resp: 18 18    Intake/Output Summary (Last 24 hours) at 02/06/16 0747 Last data filed at 02/06/16 0730  Gross per 24 hour  Intake   3100 ml  Output   2550 ml  Net    550 ml     Laboratory CBC    Component Value Date/Time   WBC 12.8* 02/06/2016 0514   HGB 8.2* 02/06/2016 0514   HCT 26.8* 02/06/2016 0514   PLT 187 02/06/2016 0514    BMET    Component Value Date/Time   NA 134* 02/06/2016 0514   K 4.0 02/06/2016 0514   CL 101 02/06/2016 0514   CO2 29 02/06/2016 0514   GLUCOSE 118* 02/06/2016 0514   BUN <5* 02/06/2016 0514   CREATININE 0.55* 02/06/2016 0514   CALCIUM 7.8* 02/06/2016 0514   GFRNONAA >60 02/06/2016 0514   GFRAA >60 02/06/2016 0514    COAG Lab Results  Component Value Date   INR 1.41 02/04/2016   INR 1.36 02/04/2016   INR 1.34 02/02/2016   No results found for: PTT  Antibiotics Anti-infectives    Start     Dose/Rate Route Frequency Ordered Stop   02/04/16 1700  cefUROXime (ZINACEF) 1.5 g in dextrose 5 % 50 mL IVPB     1.5 g 100 mL/hr over 30 Minutes Intravenous Every 12 hours 02/04/16 1459 02/05/16 0530   02/04/16 1500  cefUROXime (ZINACEF) 1.5 g in dextrose 5 % 50 mL IVPB  Status:  Discontinued     1.5 g 100 mL/hr over 30 Minutes Intravenous Every 12 hours 02/04/16 1450 02/04/16 1459   02/04/16 0600  cefUROXime (ZINACEF) 1.5 g in dextrose 5 % 50 mL IVPB     1.5 g 100 mL/hr over 30 Minutes Intravenous On call to O.R. 02/03/16 1123 02/04/16 0906   01/25/16 1830  vancomycin (VANCOCIN) 50 mg/mL oral solution 125 mg  Status:  Discontinued     125 mg Oral Every 6 hours 01/25/16 1828 01/30/16 1355       V. Leia Alf, M.D. [facility] Office: [phone] Pager:  [phone]

## 2016-02-06 NOTE — Significant Event (Signed)
Patient transferred safely to 2W28, taken via wheelchair. VS stable prior and during the transfer. No personal at bedside of 2S11 to take to new room. Report has been given to Ander Purpura, receiving RN in 2W by Mallie Darting RN.   Patient settled in bed per his requests. Staff in room prior to RN leaving. Patient's spouse updated of the transfer and is route to visit him. Lyndi Holbein, Therapist, sports.

## 2016-02-07 DIAGNOSIS — J449 Chronic obstructive pulmonary disease, unspecified: Secondary | ICD-10-CM

## 2016-02-07 LAB — TYPE AND SCREEN
ABO/RH(D): O POS
ANTIBODY SCREEN: NEGATIVE
UNIT DIVISION: 0
UNIT DIVISION: 0
Unit division: 0

## 2016-02-07 LAB — BASIC METABOLIC PANEL
Anion gap: 3 — ABNORMAL LOW (ref 5–15)
CALCIUM: 8.1 mg/dL — AB (ref 8.9–10.3)
CHLORIDE: 102 mmol/L (ref 101–111)
CO2: 31 mmol/L (ref 22–32)
CREATININE: 0.57 mg/dL — AB (ref 0.61–1.24)
GFR calc non Af Amer: >60 mL/min (ref >60)
Glucose, Bld: 97 mg/dL (ref 65–99)
Potassium: 4.6 mmol/L (ref 3.5–5.1)
SODIUM: 136 mmol/L (ref 135–145)

## 2016-02-07 LAB — PROTIME-INR
INR: 1.59 — ABNORMAL HIGH (ref 0.00–1.49)
PROTHROMBIN TIME: 19 s — AB (ref 11.6–15.2)

## 2016-02-07 LAB — CBC
HEMATOCRIT: 26.7 % — AB (ref 39.0–52.0)
Hemoglobin: 8.2 g/dL — ABNORMAL LOW (ref 13.0–17.0)
MCH: 26 pg (ref 26.0–34.0)
MCHC: 30.7 g/dL (ref 30.0–36.0)
MCV: 84.8 fL (ref 78.0–100.0)
PLATELETS: 192 10*3/uL (ref 150–400)
RBC: 3.15 MIL/uL — ABNORMAL LOW (ref 4.22–5.81)
RDW: 15.4 % (ref 11.5–15.5)
WBC: 8.9 10*3/uL (ref 4.0–10.5)

## 2016-02-07 LAB — HEPARIN LEVEL (UNFRACTIONATED)
HEPARIN UNFRACTIONATED: 0.49 [IU]/mL (ref 0.30–0.70)
HEPARIN UNFRACTIONATED: 0.45 [IU]/mL (ref 0.30–0.70)

## 2016-02-07 LAB — MAGNESIUM: MAGNESIUM: 1.7 mg/dL (ref 1.7–2.4)

## 2016-02-07 MED ORDER — WARFARIN VIDEO: Freq: Once | Status: DC

## 2016-02-07 MED ORDER — WARFARIN - PHARMACIST DOSING INPATIENT
Freq: Every day | Status: DC
  Administered 2016-02-08: 18:00:00

## 2016-02-07 MED ORDER — WARFARIN SODIUM 3 MG PO TABS
3.0000 mg | ORAL_TABLET | Freq: Once | ORAL | Status: AC
  Administered 2016-02-07: 3 mg via ORAL
  Filled 2016-02-07: qty 1

## 2016-02-07 MED ORDER — AMIODARONE HCL 200 MG PO TABS
200.0000 mg | ORAL_TABLET | Freq: Two times a day (BID) | ORAL | Status: DC
  Administered 2016-02-08 – 2016-02-09 (×3): 200 mg via NASOGASTRIC
  Filled 2016-02-07 (×3): qty 1

## 2016-02-07 MED ORDER — COUMADIN BOOK
Freq: Once | Status: DC
  Filled 2016-02-07: qty 1

## 2016-02-07 NOTE — Progress Notes (Addendum)
ANTICOAGULATION CONSULT NOTE - Follow Up Consult  Pharmacy Consult for Heparin Indication: atrial fibrillation and mesenteric ischemia with SMA occlusion  No Known Allergies  Patient Measurements: Height: 5\' 4"  (162.6 cm) Weight: 111 lb 1.8 oz (50.4 kg) IBW/kg (Calculated) : 59.2 Heparin Dosing Weight: 51 kg  Vital Signs: Temp: 98.4 F (36.9 C) (06/10 0624) Temp Source: Oral (06/10 0624) BP: 118/59 mmHg (06/10 0624) Pulse Rate: 78 (06/10 0624)  Labs:  Recent Labs  02/04/16 1513 02/05/16 0422  02/06/16 0514 02/06/16 0516 02/06/16 1442 02/07/16 0350  HGB 8.9* 9.2*  --  8.2*  --   --  8.2*  HCT 29.3* 30.2*  --  26.8*  --   --  26.7*  PLT 194 198  --  187  --   --  192  APTT 32  --   --   --   --   --   --   LABPROT 17.3*  --   --   --   --   --   --   INR 1.41  --   --   --   --   --   --   HEPARINUNFRC  --   --   < >  --  0.28* 0.60 0.49  CREATININE  --  0.48*  --  0.55*  --   --  0.57*  < > = values in this interval not displayed.  Estimated Creatinine Clearance: 57.8 mL/min (by C-G formula based on Cr of 0.57).  Assessment: AC: Heparin for hx afib, new SMA occlustion - s/p SMA bypass on 6/7. HL remains within goal at 0.45. No bleeding documented. Hg low stable, plt wnl.  Bridge to warfarin 6/10. INR 1.59 at baseline. Wt~50kg, baseline albumin<2.5, noted on amiodarone - will start warfarin at lower dose.  Goal of Therapy:  Heparin level 0.3-0.5 units/ml Monitor platelets by anticoagulation protocol: Yes   Plan:  Heparin drip at 1300 units/h - dc when INR therapeutic x2 Warfarin 3mg  x 1 dose tonight Daily HL/INRCBC Monitor for s/sx of bleeding   Elicia Lamp, PharmD, [facility] Clinical Pharmacist Pager [phone] 02/07/2016 10:29 AM

## 2016-02-07 NOTE — Consult Note (Signed)
Medical Consultation   Steve Gomez  [id number]  DOB: 24-Aug-1941  DOA: 01/25/2016  PCP: Alonza Bogus, MD   Outpatient Specialists:    Requesting physician: Vascular surgery, Dr. Curt Jews   Reason for consultation: Take over as primary team 6/9   History of Present Illness: Steve Gomez is an 75 y.o. male with medical history significant for COPD, hypertension, hypercholesterolemia, atrial fibrillation on xarelto who presented, tobacco use, AAA s/p repair. He presented to [facility] with rectal bleed 5/28. His hemoglobin was 7.4 and he was transfused 2 units of PRBC.   Has had worsening abdominal pain over the past several weeks prior to this admission. He underwent upper and lower endoscopy in [place] showing extensive ulceration in his stomach and small bowel and also throughout his colon. This was consistent with diffuse ischemia.  CT scan obtained on 6/1 showed significant signs of vascular disease of the mesenteric arteries with a new occlusion of the SMA. 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 Due to presence of multi-vessel disease and previous abdominal aortic graft, IR did not perform intervention and requested vascular surgery consultation. Pt subsequently underwent aorto-messenteric artery bypass 02/04/2016.  Transferred from 2 S to floor and TRH assumed as primary 6/10.   Review of Systems:  Review of Systems  Gastrointestinal: Positive for melena.  All other systems reviewed and are negative.  Constitutional: Negative for fever, chills, diaphoresis, activity change, appetite change and fatigue.  HENT: Negative for ear pain, nosebleeds, congestion, facial swelling, rhinorrhea, neck pain, neck  stiffness and ear discharge.   Eyes: Negative for pain, discharge, redness, itching and visual disturbance.  Respiratory: Negative for cough, choking, chest tightness, shortness of breath, wheezing and stridor.   Cardiovascular: Negative for chest pain, palpitations and leg swelling.  Genitourinary: Negative for dysuria, urgency, frequency, hematuria, flank pain, decreased urine volume, difficulty urinating and dyspareunia.  Musculoskeletal: Negative for back pain, joint swelling, arthralgias and gait problem.  Neurological: Negative for dizziness, tremors, seizures, syncope, facial asymmetry, speech difficulty, weakness, light-headedness, numbness and headaches.  Hematological: Negative for adenopathy. Does not bruise/bleed easily.  Psychiatric/Behavioral: Negative for hallucinations, behavioral problems, confusion, dysphoric mood, decreased concentration and agitation.     Past Medical History: Past Medical History  Diagnosis Date  . COPD (chronic obstructive pulmonary disease) ([place])   . Hypertension   . High cholesterol   . S/P AAA repair   . Renal calculi 01/14/2016  . New onset atrial fibrillation ([place])   . Bladder outlet obstruction 01/14/2016  . Atherosclerosis   . Tobacco abuse     Past Surgical History: Past Surgical History  Procedure Laterality Date  . Cholecystectomy    . Abdominal aortic aneurysm repair    . Esophagogastroduodenoscopy N/A 01/27/2016    Procedure: ESOPHAGOGASTRODUODENOSCOPY (EGD);  Surgeon: [name], MD;  Location: [facility];  Service: Endoscopy;  Laterality: N/A;  . Colonoscopy N/A 01/27/2016    Procedure: COLONOSCOPY;  Surgeon: [name], MD;  Location: [facility] ENDO SUITE;  Service: Endoscopy;  Laterality: N/A;  . Mesenteric artery bypass N/A 02/04/2016    Procedure: AORTO-SUPERIOR MESENTERIC ARTERY BYPASS GRAFT USING 6MM X 30 CM HEMASHIELD GOLD GRAFT;  Surgeon: Arvilla Meres  Early, MD;  Location: [facility] OR;  Service: Vascular;  Laterality: N/A;      Allergies:  No Known Allergies   Social History:  reports that he has been smoking Cigarettes.  He started smoking about 55 years ago. He has been smoking about 1.00 pack per day. He does not have any smokeless tobacco history on file. He reports that he drinks alcohol. He reports that he does not use illicit drugs.   Family History: Family History  Problem Relation Age of Onset  . Suicidality Father     Deceased   . Diabetes Brother   . Diabetes Brother   . Diabetes Brother      Physical Exam: Filed Vitals:   02/06/16 1500 02/06/16 1600 02/06/16 2119 02/07/16 0624  BP: 138/65 138/85 117/70 118/59  Pulse: 81 93 92 78  Temp:  98.1 F (36.7 C) 98 F (36.7 C) 98.4 F (36.9 C)  TempSrc:  Oral Oral Oral  Resp: 22 20 18 18   Height:      Weight:      SpO2: 96% 93% 91% 95%    Constitutional: Alert and awake, oriented x3, not in any acute distress. Eyes: PERLA, EOMI, anicteric sclera,  ENMT: external ears and nose appear normal, normal hearing, Lips appears normal, oropharynx mucosa, tongue, posterior pharynx appear normal  Neck: neck appears normal, no masses, normal ROM, no thyromegaly, no JVD  CVS: S1-S2 clear, no murmur rubs or gallops, no LE edema, normal pedal pulses  Respiratory:  clear to auscultation bilaterally, no wheezing, rales or rhonchi. Respiratory effort normal. No accessory muscle use.  Abdomen: soft nontender, nondistended, normal bowel sounds, no hepatosplenomegaly, no hernias  Musculoskeletal: : no cyanosis, clubbing or edema noted bilaterally Neuro: Cranial nerves II-XII intact, strength, sensation, reflexes Psych: judgement and insight appear normal, stable mood and affect, mental status Skin: no rashes or lesions or ulcers, no induration or nodules    Data reviewed:  I have personally reviewed following labs and imaging studies Labs:  CBC:  Recent Labs Lab 02/01/16 0555  02/04/16 0206 02/04/16 1049 02/04/16 1513 02/05/16 0422  02/06/16 0514 02/07/16 0350  WBC 11.0*  < > 13.0*  --  24.2* 17.4* 12.8* 8.9  NEUTROABS 8.6*  --   --   --   --   --   --   --   HGB 8.5*  < > 9.7* 8.5* 8.9* 9.2* 8.2* 8.2*  HCT 27.2*  < > 31.6* 25.0* 29.3* 30.2* 26.8* 26.7*  MCV 87.2  < > 84.3  --  84.4 84.6 85.1 84.8  PLT 226  < > 236  --  194 198 187 192  < > = values in this interval not displayed.  Basic Metabolic Panel:  Recent Labs Lab 02/04/16 0206 02/04/16 1049 02/04/16 1300 02/04/16 1513 02/05/16 0422 02/06/16 0514 02/07/16 0350  NA 137 136 135  --  134* 134* 136  K 3.8 3.8 3.8  --  4.0 4.0 4.6  CL 98*  --  102  --  102 101 102  CO2 30  --  27  --  29 29 31   GLUCOSE 117* 167* 138*  --  129* 118* 97  BUN 8  --  7  --  5* <5* <5*  CREATININE 0.59*  --  0.59*  --  0.48* 0.55* 0.57*  CALCIUM 8.5*  --  7.7*  --  7.6* 7.8* 8.1*  MG 1.7  --   --  1.5* 2.0 1.8 1.7  GFR Estimated Creatinine Clearance: 57.8 mL/min (by C-G formula based on Cr of 0.57). Liver Function Tests:  Recent Labs Lab 02/05/16 0422  AST 16  ALT 12*  ALKPHOS 91  BILITOT 0.6  PROT 4.3*  ALBUMIN 2.1*    Recent Labs Lab 02/05/16 0422  AMYLASE 199*   No results for input(s): AMMONIA in the last 168 hours. Coagulation profile  Recent Labs Lab 02/02/16 0400 02/04/16 0206 02/04/16 1513  INR 1.34 1.36 1.41    Cardiac Enzymes: No results for input(s): CKTOTAL, CKMB, CKMBINDEX, TROPONINI in the last 168 hours. BNP: Invalid input(s): POCBNP CBG: No results for input(s): GLUCAP in the last 168 hours. D-Dimer No results for input(s): DDIMER in the last 72 hours. Hgb A1c No results for input(s): HGBA1C in the last 72 hours. Lipid Profile No results for input(s): CHOL, HDL, LDLCALC, TRIG, CHOLHDL, LDLDIRECT in the last 72 hours. Thyroid function studies No results for input(s): TSH, T4TOTAL, T3FREE, THYROIDAB in the last 72 hours.  Invalid input(s): FREET3 Anemia work up No results for input(s): VITAMINB12, FOLATE, FERRITIN, TIBC,  IRON, RETICCTPCT in the last 72 hours. Urinalysis    Component Value Date/Time   COLORURINE YELLOW 01/25/2016 0852   APPEARANCEUR HAZY* 01/25/2016 0852   LABSPEC 1.015 01/25/2016 0852   PHURINE 6.0 01/25/2016 0852   GLUCOSEU NEGATIVE 01/25/2016 0852   HGBUR LARGE* 01/25/2016 0852   BILIRUBINUR SMALL* 01/25/2016 0852   KETONESUR NEGATIVE 01/25/2016 0852   PROTEINUR 30* 01/25/2016 0852   NITRITE NEGATIVE 01/25/2016 0852   LEUKOCYTESUR TRACE* 01/25/2016 0852     Microbiology Recent Results (from the past 240 hour(s))  Surgical pcr screen     Status: Abnormal   Collection Time: 02/03/16  4:59 PM  Result Value Ref Range Status   MRSA, PCR NEGATIVE NEGATIVE Final   Staphylococcus aureus POSITIVE (A) NEGATIVE Final    Inpatient Medications:   Scheduled Meds: . amiodarone  400 mg Per NG tube BID  . antiseptic oral rinse  7 mL Mouth Rinse q12n4p  . chlorhexidine  15 mL Mouth Rinse BID  . docusate sodium  100 mg Oral Daily  . famotidine (PEPCID) IV  20 mg Intravenous Q12H  . nicotine  21 mg Transdermal Daily   Continuous Infusions: . dextrose 5 % and 0.45 % NaCl with KCl 20 mEq/L 125 mL/hr at 02/07/16 0349  . heparin 1,300 Units/hr (02/07/16 0146)     Radiological Exams on Admission:  Dg Chest Port 1 View 02/05/2016 1. Right base atelectasis.  Increased from previous exam. 2. Pneumoperitoneum.   Dg Chest Port 1 View 02/04/2016 1. Subsegmental atelectasis or early infiltrate inferiorly in the right upper lobe. No pneumothorax. Small amount of pneumoperitoneum visible under the right hemidiaphram. 2. Esophagogastric tube tip projects below the inferior margin of the image but is seen to lie in the mid gastric body on the accompanying portable abdominal exam.   Dg Abd Portable 1v 02/04/2016  Extraluminal gas within the abdominal cavity consistent with the postoperative state. The nasogastric tube is in reasonable position.   Ct Angio Abd/pel W/ And/or W/o 01/30/2016  ADDENDUM REPORT:  01/30/2016 14:55 ADDENDUM: 5 x 6 mm ureteral stone on the right just above the right iliac artery causing moderate hydronephrosis of the right kidney. Additional small bilateral renal calculi bilaterally. On the CT of 01/25/2016, the stone was in the proximal right ureter not causing significant obstruction. Electronically Signed   By: Franchot Gallo M.D.   On: 01/30/2016 14:55 01/30/2016  VASCULAR 1.  Severe and largely heavily calcified vascular disease with high-grade stenosis versus occlusion of the celiac artery, occlusion of the origin of the superior mesenteric artery and surgical ligation of the inferior mesenteric artery. These findings are highly consistent with a chronic mesenteric ischemia affecting the entirety of the gastrointestinal tract. Nearly all flow to the gastrointestinal tract may be via collateral pathways. 2. Surgical changes of prior infrarenal abdominal aortic repair with aortic tube graft extending from the infrarenal aorta to just proximal to the aortic bifurcation. 3. Focal moderate calcified stenosis of the infrarenal abdominal aorta in the region of the proximal tube graft anastomosis. 4. Focal high-grade calcified stenoses of the bilateral common iliac artery origins followed by poststenotic dilatation as described above. 5. Bulky calcified atherosclerotic plaque in both common femoral arteries resulting in mild to moderate stenosis. 6. Primarily fibro fatty plaque results in a mild narrowing of the right renal artery origin. 7. Calcified plaque results in moderate narrowing of the left renal artery origin. NON VASCULAR 1. No evidence of pneumatosis, portal venous air, or free air. 2. No evidence of obstruction or focal bowel wall thickening. 3. Bilateral pleural effusions, periportal edema, anasarca, small volume ascites and mesenteric edema all consistent with volume overload versus CHF or third-spacing. 4. Bilateral lower lobe atelectasis and bronchial wall thickening. 5. Additional  ancillary findings as above without significant interval change.   Dg Abd Acute W/chest 01/25/2016   1. No acute abnormality. 2. 6 mm oval calcification overlying the right lower abdomen, medially. This could potentially represent a ureteral calculus or phlebolith. An appendicolith is less likely. 3. Thoracolumbar scoliosis and degenerative changes.   Ct Abdomen Pelvis W Wo Contrast 01/25/2016  Resolution of previously seen portal venous gas. No findings to correspond with the patient's given clinical history of active GI bleeding are seen. Direct visualization may be helpful. Nuclear bleeding scan may also be helpful for localization Remainder of the exam is stable from the prior study with evidence of bilateral renal calculi and right renal cystic change without obstructive change. Chronic changes as described.     Impression/Recommendations  Mesenteric ischemia in pt with chronic IMA occlusion / acute on chronic SMA occlusion with progression / Abdominal pain - As already noted above, pt underwent upper and lower endoscopy in [place] showing extensive ulceration in his stomach and small bowel and also throughout the colon. This was consistent with diffuse ischemia.  - He has had CT angiograms and since 5/14 through 6/2 there was a significant change in his superior mesenteric flow. The area of high-grade stenosis in the proximal superior mesenteric artery was thrombosed on the June 2 study. - Pt underwent aorto-mesenteric bypass 6/7 by vascular surgery - Doing fine postoperatively - On heparin and will start coumadin today - Will need to monitor for bleeding  Acute blood loss anemia / Upper GI bleed - Ischemia on EGD and ulceration throughout the colon on colonoscopy - No reports of bleeding on heparin drip - Monitor daily CBC  Paroxysmal atrial fibrillation - CHADs2vasc = 3  - Rate controlled with amiodarone: 400mg  BID x 7 days through 6/8, then 200mg  BID x 14 days started on 6/10  and ending on 6/23, followed by 200mg  daily thereafter starting on 6/24 - On heparin drip, start coumadin tonight   COPD/OSA - CPAP at bedtime - Stable respiratory status  Obstructing right ureteral stone / Bladder outlet obstruction - 5-91mm causing moderate right hydronephrosis, asymptomatic.   Severe protein calorie malnutrition -In the context of chronic  illness - Diet as tolerated    DVT prophylaxis: Heparin gtt and starting coumadin today  Code Status: Full code Family Communication: wife at the bedside  Disposition Plan: Will start coumadin today, home once INR at therapeutic level     Consultants:   At [facility]: Cardiology, Gastroenterology.   At [facility]: IR, Vascular surgery  Procedures:   Aorto-mesenteric artery bypass 6/7   EGD and colonoscopy 5/30 - findings concerning for ischemia on EGD and ulceration throughout most of the colon   Antimicrobials:   None  .   Time Spent: 55 minutes   Leisa Lenz M.D. [facility] 02/07/2016, 11:14 AM

## 2016-02-07 NOTE — Discharge Instructions (Signed)
Information on my medicine - Coumadin   (Warfarin)  This medication education was reviewed with me or my healthcare representative as part of my discharge preparation.  The pharmacist that spoke with me during my hospital stay was:  Romona Curls, [facility]  Why was Coumadin prescribed for you? Coumadin was prescribed for you because you have a blood clot or a medical condition that can cause an increased risk of forming blood clots. Blood clots can cause serious health problems by blocking the flow of blood to the heart, lung, or brain. Coumadin can prevent harmful blood clots from forming. As a reminder your indication for Coumadin is:   Deep Vein Thrombosis Treatment , atrial fibrillation  What test will check on my response to Coumadin? While on Coumadin (warfarin) you will need to have an INR test regularly to ensure that your dose is keeping you in the desired range. The INR (international normalized ratio) number is calculated from the result of the laboratory test called prothrombin time (PT).  If an INR APPOINTMENT HAS NOT ALREADY BEEN MADE FOR YOU please schedule an appointment to have this lab work done by your health care provider within 7 days. Your INR goal is usually a number between:  2 to 3 or your provider may give you a more narrow range like 2-2.5.  Ask your health care provider during an office visit what your goal INR is.  What  do you need to  know  About  COUMADIN? Take Coumadin (warfarin) exactly as prescribed by your healthcare provider about the same time each day.  DO NOT stop taking without talking to the doctor who prescribed the medication.  Stopping without other blood clot prevention medication to take the place of Coumadin may increase your risk of developing a new clot or stroke.  Get refills before you run out.  What do you do if you miss a dose? If you miss a dose, take it as soon as you remember on the same day then continue your regularly scheduled regimen the  next day.  Do not take two doses of Coumadin at the same time.  Important Safety Information A possible side effect of Coumadin (Warfarin) is an increased risk of bleeding. You should call your healthcare provider right away if you experience any of the following: ? Bleeding from an injury or your nose that does not stop. ? Unusual colored urine (red or dark brown) or unusual colored stools (red or black). ? Unusual bruising for unknown reasons. ? A serious fall or if you hit your head (even if there is no bleeding).  Some foods or medicines interact with Coumadin (warfarin) and might alter your response to warfarin. To help avoid this: ? Eat a balanced diet, maintaining a consistent amount of Vitamin K. ? Notify your provider about major diet changes you plan to make. ? Avoid alcohol or limit your intake to 1 drink for women and 2 drinks for men per day. (1 drink is 5 oz. wine, 12 oz. beer, or 1.5 oz. liquor.)  Make sure that ANY health care provider who prescribes medication for you knows that you are taking Coumadin (warfarin).  Also make sure the healthcare provider who is monitoring your Coumadin knows when you have started a new medication including herbals and non-prescription products.  Coumadin (Warfarin)  Major Drug Interactions  Increased Warfarin Effect Decreased Warfarin Effect  Alcohol (large quantities) Antibiotics (esp. Septra/Bactrim, Flagyl, Cipro) Amiodarone (Cordarone) Aspirin (ASA) Cimetidine (Tagamet) Megestrol (Megace) NSAIDs (  ibuprofen, naproxen, etc.) °Piroxicam (Feldene) °Propafenone (Rythmol SR) °Propranolol (Inderal) °Isoniazid (INH) °Posaconazole (Noxafil) Barbiturates (Phenobarbital) °Carbamazepine (Tegretol) °Chlordiazepoxide (Librium) °Cholestyramine (Questran) °Griseofulvin °Oral Contraceptives °Rifampin °Sucralfate (Carafate) °Vitamin K  ° °Coumadin® (Warfarin) Major Herbal Interactions  °Increased Warfarin Effect Decreased Warfarin Effect   °Garlic °Ginseng °Ginkgo biloba Coenzyme Q10 °Green tea °St. John’s wort   ° °Coumadin® (Warfarin) FOOD Interactions  °Eat a consistent number of servings per week of foods HIGH in Vitamin K °(1 serving = ½ cup)  °Collards (cooked, or boiled & drained) °Kale (cooked, or boiled & drained) °Mustard greens (cooked, or boiled & drained) °Parsley *serving size only = ¼ cup °Spinach (cooked, or boiled & drained) °Swiss chard (cooked, or boiled & drained) °Turnip greens (cooked, or boiled & drained)  °Eat a consistent number of servings per week of foods MEDIUM-HIGH in Vitamin K °(1 serving = 1 cup)  °Asparagus (cooked, or boiled & drained) °Broccoli (cooked, boiled & drained, or raw & chopped) °Brussel sprouts (cooked, or boiled & drained) *serving size only = ½ cup °Lettuce, raw (green leaf, endive, romaine) °Spinach, raw °Turnip greens, raw & chopped  ° °These websites have more information on Coumadin (warfarin):  www.coumadin.com; °www.ahrq.gov/consumer/coumadin.htm; ° ° ° °

## 2016-02-07 NOTE — Progress Notes (Addendum)
[facility] [place]  Subjective  - Doing well, passed flatus, tolerating ice chips and sips of water.    Objective 118/59 78 98.4 F (36.9 C) (Oral) 18 95%  Intake/Output Summary (Last 24 hours) at 02/07/16 0717 Last data filed at 02/06/16 2237  Gross per 24 hour  Intake    425 ml  Output   2175 ml  Net  -1750 ml    Feet warm and well perfused Abdominal incision clean and dry Abdomin soft with positive BS Heart irregularly irregular Lungs non labored breathing  Assessment/Planning: POD # 3 AORTO-SUPERIOR MESENTERIC ARTERY BYPASS GRAFT USING 6MM X 30 CM HEMASHIELD GOLD GRAFT (N/A)  Increase mobility, advance diet to clears as tolerates Heparin followed by Xarelto verses coumadin for discharge planning   Laurence Slate [facility] 02/07/2016 7:17 AM --  Laboratory Lab Results:  Recent Labs  02/06/16 0514 02/07/16 0350  WBC 12.8* 8.9  HGB 8.2* 8.2*  HCT 26.8* 26.7*  PLT 187 192   BMET  Recent Labs  02/06/16 0514 02/07/16 0350  NA 134* 136  K 4.0 4.6  CL 101 102  CO2 29 31  GLUCOSE 118* 97  BUN <5* <5*  CREATININE 0.55* 0.57*  CALCIUM 7.8* 8.1*    COAG Lab Results  Component Value Date   INR 1.41 02/04/2016   INR 1.36 02/04/2016   INR 1.34 02/02/2016   No results found for: PTT    I agree with the above.  The patient tolerated sips and chips yesterday.  He is passing flatus. We'll advance diet to full liquids today. Encourage mobilization Continue IV heparin.  Okay from surgical perspective to transition to oral anticoagulation with either a NOAC or Coumadin.  Annamarie Major

## 2016-02-08 DIAGNOSIS — K922 Gastrointestinal hemorrhage, unspecified: Secondary | ICD-10-CM

## 2016-02-08 LAB — BASIC METABOLIC PANEL
Anion gap: 5 (ref 5–15)
BUN: <5 mg/dL — ABNORMAL LOW (ref 6–20)
CALCIUM: 7.9 mg/dL — AB (ref 8.9–10.3)
CO2: 27 mmol/L (ref 22–32)
CREATININE: 0.54 mg/dL — AB (ref 0.61–1.24)
Chloride: 103 mmol/L (ref 101–111)
Glucose, Bld: 114 mg/dL — ABNORMAL HIGH (ref 65–99)
Potassium: 3.9 mmol/L (ref 3.5–5.1)
Sodium: 135 mmol/L (ref 135–145)

## 2016-02-08 LAB — CBC
HCT: 25.9 % — ABNORMAL LOW (ref 39.0–52.0)
Hemoglobin: 7.8 g/dL — ABNORMAL LOW (ref 13.0–17.0)
MCH: 25.7 pg — ABNORMAL LOW (ref 26.0–34.0)
MCHC: 30.1 g/dL (ref 30.0–36.0)
MCV: 85.5 fL (ref 78.0–100.0)
PLATELETS: 193 10*3/uL (ref 150–400)
RBC: 3.03 MIL/uL — ABNORMAL LOW (ref 4.22–5.81)
RDW: 15.3 % (ref 11.5–15.5)
WBC: 7.8 10*3/uL (ref 4.0–10.5)

## 2016-02-08 LAB — PROTIME-INR
INR: 1.6 — ABNORMAL HIGH (ref 0.00–1.49)
PROTHROMBIN TIME: 19.1 s — AB (ref 11.6–15.2)

## 2016-02-08 LAB — MAGNESIUM: MAGNESIUM: 1.5 mg/dL — AB (ref 1.7–2.4)

## 2016-02-08 LAB — HEPARIN LEVEL (UNFRACTIONATED): Heparin Unfractionated: 0.47 IU/mL (ref 0.30–0.70)

## 2016-02-08 MED ORDER — WARFARIN SODIUM 3 MG PO TABS
3.0000 mg | ORAL_TABLET | Freq: Once | ORAL | Status: AC
  Administered 2016-02-08: 3 mg via ORAL
  Filled 2016-02-08: qty 1

## 2016-02-08 NOTE — Progress Notes (Addendum)
[facility] [place]  Subjective  - He tolerated clear liquids yesterday.  He wants to get up out of the bed some today.    Objective 123/69 76 98.2 F (36.8 C) (Oral) 18 98%  Intake/Output Summary (Last 24 hours) at 02/08/16 0740 Last data filed at 02/08/16 0442  Gross per 24 hour  Intake    240 ml  Output   3400 ml  Net  -3160 ml    Feet warm well perfused, sensation intact and active range of motion. Abdomin soft, positive BS Incision healing well Heart A fib Lungs non labored breathing on RA  Assessment/Planning: POD # 4 AORTO-SUPERIOR MESENTERIC ARTERY BYPASS GRAFT USING 6MM X 30 CM HEMASHIELD GOLD GRAFT (N/A)  Abdomin soft with positive BS, no V/V yesterday will advance to full liquid diet today Encourage mobility HGB has decreased to 7.9, he has a history of GI bleed will watch for now.  He is asymptomatic.  Coumadin was started per primary team bridged with heparin.   Laurence Slate [facility] 02/08/2016 7:40 AM --  Laboratory Lab Results:  Recent Labs  02/07/16 0350 02/08/16 0308  WBC 8.9 7.8  HGB 8.2* 7.8*  HCT 26.7* 25.9*  PLT 192 193   BMET  Recent Labs  02/07/16 0350 02/08/16 0308  NA 136 135  K 4.6 3.9  CL 102 103  CO2 31 27  GLUCOSE 97 114*  BUN <5* <5*  CREATININE 0.57* 0.54*  CALCIUM 8.1* 7.9*    COAG Lab Results  Component Value Date   INR 1.60* 02/08/2016   INR 1.59* 02/07/2016   INR 1.41 02/04/2016   No results found for: PTT    Agree with above Slowly advance diet Continue anticoagulation   Wells BRabham

## 2016-02-08 NOTE — Progress Notes (Signed)
Patient ID: Steve Gomez, male   DOB: [birth year], 75 y.o.   MRN: [id number]  PROGRESS NOTE    Steve Gomez  [id number] DOB: [birth year] DOA: 01/25/2016  PCP: Alonza Bogus, MD   Brief Narrative:  Steve Gomez is an 75 y.o. male with medical history significant for COPD, hypertension, hypercholesterolemia, atrial fibrillation on xarelto who presented, tobacco use, AAA s/p repair. [facility] with [facility] 5/28. His hemoglobin was 7.4 and he was transfused 2 units of PRBC.   Has had worsening abdominal pain over the past several weeks prior to this admission. He underwent upper and lower endoscopy in [place] showing extensive ulceration in his stomach and small bowel and also throughout his colon. This was consistent with diffuse ischemia.  CT scan obtained on 6/1 showed significant signs of vascular disease of the mesenteric arteries with a new occlusion of the SMA. 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 Due to presence of multi-vessel disease and previous abdominal aortic graft, IR did not perform intervention and requested vascular surgery consultation. Pt subsequently underwent aorto-messenteric artery bypass 02/04/2016.  Transferred from 2 S to floor and TRH assumed as primary 6/10.   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 This was consistent with diffuse ischemia.  - He has had CT angiograms and since 5/14 through 6/2 there was a significant change in his superior  mesenteric flow. The area of high-grade stenosis in the proximal superior mesenteric artery was thrombosed on the June 2 study. - Pt underwent aorto-mesenteric bypass 6/7 by vascular surgery - On heparin and coumadin   Acute blood loss anemia / Upper GI bleed - Ischemia on EGD and ulceration throughout the colon on colonoscopy - No reports of bleeding on heparin drip - Hgb 7.8 this am - Daily CBC   Paroxysmal atrial fibrillation - CHADs2vasc = 3  - Rate controlled with amiodarone: 400mg  BID x 7 days through 6/8, then 200mg  BID x 14 days started on 6/10 and ending on 6/23, followed by 200 mg daily thereafter starting on 6/24 - On heparin drip and coumadin   COPD/OSA - CPAP at bedtime - Stable   Obstructing right ureteral stone / Bladder outlet obstruction - 5-6 mm causing moderate right hydronephrosis, asymptomatic.   Severe protein calorie malnutrition -In the context of chronic illness - Diet as tolerated    DVT prophylaxis: Heparin and coumadin  Code Status: Full code Family Communication: wife at the bedside 6/10 Disposition Plan: home once INR at therapeutic level     Consultants:   At [facility]: Cardiology, Gastroenterology.   At [facility]: IR, Vascular surgery  Procedures:   Aorto-mesenteric artery bypass 6/7   EGD and colonoscopy 5/30 - findings concerning for ischemia on EGD and ulceration throughout most of the colon   Antimicrobials:   None   Subjective: No overnight events.   Objective: Filed Vitals:   02/06/16 2119 02/07/16 0624 02/07/16 2019 02/08/16 0442  BP: 117/70 118/59 125/64 123/69  Pulse: 92 78 76 76  Temp: 98 F (36.7 C) 98.4  F (36.9 C) 98.3 F (36.8 C) 98.2 F (36.8 C)  TempSrc: Oral Oral Oral Oral  Resp: 18 18 18 18   Height:      Weight:      SpO2: 91% 95% 97% 98%    Intake/Output Summary (Last 24 hours) at 02/08/16 1250 Last data filed at 02/08/16 1200  Gross per 24 hour  Intake   1238 ml  Output   4650 ml    Net  -3412 ml   Filed Weights   02/01/16 1933 02/03/16 2100 02/06/16 0600  Weight: 51.801 kg (114 lb 3.2 oz) 51.166 kg (112 lb 12.8 oz) 50.4 kg (111 lb 1.8 oz)    Examination:  General exam: Appears calm and comfortable  Respiratory system: Clear to auscultation. Respiratory effort normal. Cardiovascular system: S1 & S2 heard, RRR. No JVD Gastrointestinal system: Abdomen is nondistended, soft and nontender. No organomegaly or masses felt. Normal bowel sounds heard. Central nervous system: Alert and oriented. No focal neurological deficits. Extremities: Symmetric 5 x 5 power. Skin: No rashes, lesions or ulcers Psychiatry: Judgement and insight appear normal. Mood & affect appropriate.   Data Reviewed: I have personally reviewed following labs and imaging studies  CBC:  Recent Labs Lab 02/04/16 1513 02/05/16 0422 02/06/16 0514 02/07/16 0350 02/08/16 0308  WBC 24.2* 17.4* 12.8* 8.9 7.8  HGB 8.9* 9.2* 8.2* 8.2* 7.8*  HCT 29.3* 30.2* 26.8* 26.7* 25.9*  MCV 84.4 84.6 85.1 84.8 85.5  PLT 194 198 187 192 0000000   Basic Metabolic Panel:  Recent Labs Lab 02/04/16 1300 02/04/16 1513 02/05/16 0422 02/06/16 0514 02/07/16 0350 02/08/16 0308  NA 135  --  134* 134* 136 135  K 3.8  --  4.0 4.0 4.6 3.9  CL 102  --  102 101 102 103  CO2 27  --  29 29 31 27   GLUCOSE 138*  --  129* 118* 97 114*  BUN 7  --  5* <5* <5* <5*  CREATININE 0.59*  --  0.48* 0.55* 0.57* 0.54*  CALCIUM 7.7*  --  7.6* 7.8* 8.1* 7.9*  MG  --  1.5* 2.0 1.8 1.7 1.5*   GFR: Estimated Creatinine Clearance: 57.8 mL/min (by C-G formula based on Cr of 0.54). Liver Function Tests:  Recent Labs Lab 02/05/16 0422  AST 16  ALT 12*  ALKPHOS 91  BILITOT 0.6  PROT 4.3*  ALBUMIN 2.1*    Recent Labs Lab 02/05/16 0422  AMYLASE 199*   No results for input(s): AMMONIA in the last 168 hours. Coagulation Profile:  Recent Labs Lab 02/02/16 0400 02/04/16 0206 02/04/16 1513 02/07/16 1129 02/08/16 0308  INR  1.34 1.36 1.41 1.59* 1.60*   Cardiac Enzymes: No results for input(s): CKTOTAL, CKMB, CKMBINDEX, TROPONINI in the last 168 hours. BNP (last 3 results) No results for input(s): PROBNP in the last 8760 hours. HbA1C: No results for input(s): HGBA1C in the last 72 hours. CBG: No results for input(s): GLUCAP in the last 168 hours. Lipid Profile: No results for input(s): CHOL, HDL, LDLCALC, TRIG, CHOLHDL, LDLDIRECT in the last 72 hours. Thyroid Function Tests: No results for input(s): TSH, T4TOTAL, FREET4, T3FREE, THYROIDAB in the last 72 hours. Anemia Panel: No results for input(s): VITAMINB12, FOLATE, FERRITIN, TIBC, IRON, RETICCTPCT in the last 72 hours. Urine analysis:    Component Value Date/Time   COLORURINE YELLOW 01/25/2016 0852   APPEARANCEUR HAZY* 01/25/2016 0852   LABSPEC 1.015 01/25/2016 0852   PHURINE 6.0 01/25/2016 [place] 01/25/2016 [id number]  HGBUR LARGE* 01/25/2016 0852   BILIRUBINUR SMALL* 01/25/2016 0852   KETONESUR NEGATIVE 01/25/2016 0852   PROTEINUR 30* 01/25/2016 0852   NITRITE NEGATIVE 01/25/2016 0852   LEUKOCYTESUR TRACE* 01/25/2016 0852   Sepsis Labs: @LABRCNTIP (procalcitonin:4,lacticidven:4)   ) Recent Results (from the past 240 hour(s))  Surgical pcr screen     Status: Abnormal   Collection Time: 02/03/16  4:59 PM  Result Value Ref Range Status   MRSA, PCR NEGATIVE NEGATIVE Final   Staphylococcus aureus POSITIVE (A) NEGATIVE Final    Comment:        The Xpert SA Assay (FDA approved for NASAL specimens in patients over 76 years of age), is one component of a comprehensive surveillance program.  Test performance has been validated by [facility] for patients greater than or equal to 26 year old. It is not intended to diagnose infection nor to guide or monitor treatment.       Radiology Studies from 6/7 - 6/8: Dg Chest Port 1 View 02/05/2016 1. Right base atelectasis.  Increased from previous exam. 2. Pneumoperitoneum.  Electronically Signed   By: Kerby Moors M.D.   On: 02/05/2016 08:21   Dg Chest Port 1 View 02/04/2016   1. Subsegmental atelectasis or early infiltrate inferiorly in the right upper lobe. No pneumothorax. Small amount of pneumoperitoneum visible under the right hemidiaphram. 2. Esophagogastric tube tip projects below the inferior margin of the image but is seen to lie in the mid gastric body on the accompanying portable abdominal exam. Electronically Signed   By: David  Martinique M.D.   On: 02/04/2016 13:58   Dg Abd Portable 1v 02/04/2016  Extraluminal gas within the abdominal cavity consistent with the postoperative state. The nasogastric tube is in reasonable position. Electronically Signed   By: David  Martinique M.D.   On: 02/04/2016 14:01      Scheduled Meds: . amiodarone  200 mg Per NG tube BID  . antiseptic oral rinse  7 mL Mouth Rinse q12n4p  . chlorhexidine  15 mL Mouth Rinse BID  . coumadin book   Does not apply Once  . docusate sodium  100 mg Oral Daily  . famotidine (PEPCID) IV  20 mg Intravenous Q12H  . nicotine  21 mg Transdermal Daily  . warfarin  3 mg Oral ONCE-1800  . warfarin   Does not apply Once  . Warfarin - Pharmacist Dosing Inpatient   Does not apply q1800   Continuous Infusions: . dextrose 5 % and 0.45 % NaCl with KCl 20 mEq/L 125 mL/hr at 02/07/16 2153  . heparin 1,300 Units/hr (02/07/16 2206)     LOS: 14 days    Time spent: 15 minutes  Greater than 50% of the time spent on counseling and coordinating the care.   Leisa Lenz, MD [facility] Pager [phone]  If 7PM-7AM, please contact night-coverage [email or web address] Password [email or web address] 02/08/2016, 12:50 PM

## 2016-02-08 NOTE — Progress Notes (Signed)
ANTICOAGULATION CONSULT NOTE - Follow Up Consult  Pharmacy Consult for Heparin Indication: atrial fibrillation and mesenteric ischemia with SMA occlusion  No Known Allergies  Patient Measurements: Height: 5\' 4"  (162.6 cm) Weight: 111 lb 1.8 oz (50.4 kg) IBW/kg (Calculated) : 59.2 Heparin Dosing Weight: 51 kg  Vital Signs: Temp: 98.2 F (36.8 C) (06/11 0442) Temp Source: Oral (06/11 0442) BP: 123/69 mmHg (06/11 0442) Pulse Rate: 76 (06/11 0442)  Labs:  Recent Labs  02/06/16 0514  02/07/16 0350 02/07/16 1129 02/08/16 0308  HGB 8.2*  --  8.2*  --  7.8*  HCT 26.8*  --  26.7*  --  25.9*  PLT 187  --  192  --  193  LABPROT  --   --   --  19.0* 19.1*  INR  --   --   --  1.59* 1.60*  HEPARINUNFRC  --   < > 0.49 0.45 0.47  CREATININE 0.55*  --  0.57*  --  0.54*  < > = values in this interval not displayed.  Estimated Creatinine Clearance: 57.8 mL/min (by C-G formula based on Cr of 0.54).  Assessment: Heparin for hx afib, new SMA occlustion - s/p SMA bypass on 6/7. HL remains within goal at 0.45. No bleeding documented. Hg low stable, plt wnl.  Bridge to warfarin 6/10. INR 1.59 at baseline. Wt~50kg, baseline albumin<2.5, noted on amiodarone - will start warfarin at lower dose. INR 1.6 this AM after 1 dose last night.  Goal of Therapy:  Heparin level 0.3-0.5 units/ml Monitor platelets by anticoagulation protocol: Yes   Plan:  Heparin drip at 1300 units/h - dc when INR therapeutic x2 Warfarin 3mg  x 1 dose again tonight Daily HL/INRCBC Monitor for s/sx of bleeding   Elicia Lamp, PharmD, [facility] Clinical Pharmacist Pager [phone] 02/08/2016 11:09 AM

## 2016-02-09 ENCOUNTER — Other Ambulatory Visit: Payer: Self-pay | Admitting: *Deleted

## 2016-02-09 DIAGNOSIS — E43 Unspecified severe protein-calorie malnutrition: Secondary | ICD-10-CM

## 2016-02-09 LAB — PROTIME-INR
INR: 2.13 — ABNORMAL HIGH (ref 0.00–1.49)
PROTHROMBIN TIME: 23.7 s — AB (ref 11.6–15.2)

## 2016-02-09 LAB — CBC
HCT: 29 % — ABNORMAL LOW (ref 39.0–52.0)
Hemoglobin: 8.7 g/dL — ABNORMAL LOW (ref 13.0–17.0)
MCH: 25.3 pg — ABNORMAL LOW (ref 26.0–34.0)
MCHC: 30 g/dL (ref 30.0–36.0)
MCV: 84.3 fL (ref 78.0–100.0)
PLATELETS: 212 10*3/uL (ref 150–400)
RBC: 3.44 MIL/uL — ABNORMAL LOW (ref 4.22–5.81)
RDW: 15.2 % (ref 11.5–15.5)
WBC: 7.6 10*3/uL (ref 4.0–10.5)

## 2016-02-09 LAB — BASIC METABOLIC PANEL
Anion gap: 5 (ref 5–15)
BUN: <5 mg/dL — ABNORMAL LOW (ref 6–20)
CALCIUM: 8.4 mg/dL — AB (ref 8.9–10.3)
CO2: 31 mmol/L (ref 22–32)
CREATININE: 0.59 mg/dL — AB (ref 0.61–1.24)
Chloride: 101 mmol/L (ref 101–111)
GFR calc non Af Amer: >60 mL/min (ref >60)
Glucose, Bld: 117 mg/dL — ABNORMAL HIGH (ref 65–99)
Potassium: 4.2 mmol/L (ref 3.5–5.1)
Sodium: 137 mmol/L (ref 135–145)

## 2016-02-09 LAB — HEPARIN LEVEL (UNFRACTIONATED): HEPARIN UNFRACTIONATED: 0.61 [IU]/mL (ref 0.30–0.70)

## 2016-02-09 LAB — MAGNESIUM: Magnesium: 1.5 mg/dL — ABNORMAL LOW (ref 1.7–2.4)

## 2016-02-09 MED ORDER — AMIODARONE HCL 200 MG PO TABS: 200.0000 mg | ORAL_TABLET | Freq: Two times a day (BID) | ORAL | Status: DC

## 2016-02-09 MED ORDER — AMIODARONE HCL 200 MG PO TABS
200.0000 mg | ORAL_TABLET | Freq: Every day | ORAL | Status: DC
Start: 2016-02-21 — End: 2016-02-26

## 2016-02-09 MED ORDER — ENSURE ENLIVE PO LIQD
237.0000 mL | Freq: Three times a day (TID) | ORAL | Status: DC
  Administered 2016-02-09: 237 mL via ORAL

## 2016-02-09 MED ORDER — WARFARIN SODIUM 3 MG PO TABS: 1.5000 mg | ORAL_TABLET | Freq: Every day | ORAL | Status: DC

## 2016-02-09 MED ORDER — WARFARIN SODIUM 3 MG PO TABS: 1.5000 mg | ORAL_TABLET | ORAL | Status: DC

## 2016-02-09 MED ORDER — WARFARIN SODIUM 3 MG PO TABS: 3.0000 mg | ORAL_TABLET | ORAL | Status: DC

## 2016-02-09 MED ORDER — FAMOTIDINE 20 MG PO TABS: 20.0000 mg | ORAL_TABLET | Freq: Every day | ORAL | Status: AC

## 2016-02-09 MED ORDER — TAMSULOSIN HCL 0.4 MG PO CAPS: 0.4000 mg | ORAL_CAPSULE | Freq: Every day | ORAL | Status: AC

## 2016-02-09 NOTE — Progress Notes (Signed)
ANTICOAGULATION CONSULT NOTE - Follow Up Consult  Pharmacy Consult for Coumadin Indication: atrial fibrillation and mesenteric ischemia with SMA occlusion  No Known Allergies  Patient Measurements: Height: 5\' 4"  (162.6 cm) Weight: 111 lb 1.8 oz (50.4 kg) IBW/kg (Calculated) : 59.2  Vital Signs: Temp: 98.4 F (36.9 C) (06/12 0456) Temp Source: Oral (06/12 0456) BP: 134/62 mmHg (06/12 0456) Pulse Rate: 79 (06/12 0456)  Labs:  Recent Labs  02/07/16 0350 02/07/16 1129 02/08/16 0308 02/09/16 0350 02/09/16 0358  HGB 8.2*  --  7.8* 8.7*  --   HCT 26.7*  --  25.9* 29.0*  --   PLT 192  --  193 212  --   LABPROT  --  19.0* 19.1* 23.7*  --   INR  --  1.59* 1.60* 2.13*  --   HEPARINUNFRC 0.49 0.45 0.47  --  0.61  CREATININE 0.57*  --  0.54* 0.59*  --     Estimated Creatinine Clearance: 57.8 mL/min (by C-G formula based on Cr of 0.59).  Assessment: 74yom on heparin to coumadin bridge for afib and new SMA occlustion - s/p SMA bypass on 6/7. INR therapeutic today at 2.13 so ok to d/c heparin. He continues on amiodarone so will continue to watch for drug interaction. INR rising fast after just two doses of coumadin so will decrease today's dose.  Goal of Therapy:  INR 2-3 Monitor platelets by anticoagulation protocol: Yes   Plan:  1) D/C heparin 2) Coumadin 1.5mg  x 1 tonight  **If he discharges today, recommend 1.5mg  MWF and 3mg  all other days with close INR follow up**  Nena Jordan, PharmD, BCPS 02/09/2016 10:42 AM

## 2016-02-09 NOTE — Progress Notes (Signed)
Pt discharged this afternoon with his wife. Given paper prescriptions for 30 day supply. Wife requested 3 month supply - instructed to follow up with PCP. Pt will follow up with PCP, Dr. Tawni Millers, and urologist. Hanley Seamen patient address & phone number for [facility] (Dr. Tawni Millers) as it was not on the AVS. Pt discharged with foley per order as was placed prior to admission by urology. IV removed, telemetry removed, CCMD notified.   Discussed with the patient and all questioned fully answered. He will call me if any problems arise.  Fritz Pickerel, RN

## 2016-02-09 NOTE — Consult Note (Signed)
   [facility] CM Inpatient Consult   02/09/2016  Steve Gomez Oct 22, 1940 [id number]   Spoke with Steve Gomez and wife at bedside. Steve Gomez indicates the plan is for discharge today. She reports that Steve Gomez is feeling better and "is even able to eat now". Discussed [facility] Care Management follow up post hospital discharge. Explained that [facility] Management will not interfere or replace services provided by home health. Discussed that Steve Gomez will receive post hospital discharge calls and will be evaluated for monthly home visits. Steve Gomez was active with [facility] prior to admission. Spoke with inpatient RNCM who reports Steve Gomez will have home health services and that he will go home with a foley cathter as well.   Will re-refer for [facility] RNCM follow up since patient has been in hospital greater than 10 days. Steve Gomez has history includes COPD, AFIB, HTN. He is s/p aorto-mesenteric bypass on 6/7.  Marthenia Rolling, MSN-Ed, RN,BSN [facility] Liaison [phone]

## 2016-02-09 NOTE — Patient Outreach (Signed)
[facility]) Care Management  02/09/2016  Kadrian Ming [birth year] [id number]  [facility] COMMUNITY Case Management will close Mr. Varghese case as he has been inpatient x > 10 days. However, [place] are following Mr. Gigante while he is hospitalized and will refer Mr. Bearup to community case management again at discharge.   Plan: I look forward to working with Mr. Betschart upon hospital discharge.    [facility]  [phone]

## 2016-02-09 NOTE — Discharge Summary (Addendum)
Physician Discharge Summary  Steve Gomez [id number] DOB: 05-13-1941 DOA: 01/25/2016  PCP: Alonza Bogus, MD  Admit date: 01/25/2016 Discharge date: 02/09/2016  Recommendations for Outpatient Follow-up:  Please continue Coumadin 1.5 mg at bedtime Monday, Wednesday and Friday and on all other days, Tuesday, Thursday, Saturday and Sunday take 3 mg at bedtime. Please check your INR by the end of the week, 02/12/2016 to make sure INR is between 2 and 3. INR is 2.13 today prior to discharge 02/09/2016. While on Coumadin please monitor for any sign of bleeding such as black tarry stool or blood in the stool, blood in the urine, bleeding from mucosal surfaces such as gums, nose. If you notice any sign of bleed please stop Coumadin, call your primary care physician or come to ED for evaluation as it may be a sign of active bleed.  Take amiodarone 200 mg twice daily through 02/20/2016 and starting 02/21/2016 take 200 mg every day. Follow-up with primary care physician to make sure symptoms are stable.  Your hemoglobin is 8.7, stable prior to discharge.  Home health order placed including physical therapy and nurse. Nurse specifically to evaluate for Foley catheter removal.  Discharge Diagnoses:  Principal Problem:   Mesenteric ischemia ([place]) Active Problems:   Atrial fibrillation with RVR (HCC)   COPD (chronic obstructive pulmonary disease) (HCC)   Hypertension   High cholesterol   Hypokalemia   Acute blood loss anemia   Rectal bleeding   Protein-calorie malnutrition, severe   Diarrhea   Ischemic disease of gut (HCC)   Pressure ulcer    Discharge Condition: stable   Diet recommendation: as tolerated   History of present illness:  75 y.o. male with medical history significant for COPD, hypertension, hypercholesterolemia, atrial fibrillation on xarelto who presented, tobacco use, AAA s/p repair. [facility] with [facility] bleed 5/28. His hemoglobin was 7.4 and he was  transfused 2 units of PRBC.   Has had worsening abdominal pain over the past several weeks prior to this admission. He underwent upper and lower endoscopy in [place] showing extensive ulceration in his stomach and small bowel and also throughout his colon. This was consistent with diffuse ischemia.  CT scan obtained on 6/1 showed significant signs of vascular disease of the mesenteric arteries with a new occlusion of the SMA. 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 Due to presence of multi-vessel disease and previous abdominal aortic graft, IR did not perform intervention and requested vascular surgery consultation. Pt subsequently underwent aorto-messenteric artery bypass 02/04/2016.  Transferred from 2 S to floor and TRH assumed as primary 6/10.  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 This was consistent with diffuse ischemia.  - He has had CT angiograms and since 5/14 through 6/2 there was a significant change in his superior mesenteric flow. The area of high-grade stenosis in the proximal superior mesenteric artery was thrombosed on the June 2 study. - Pt underwent aorto-mesenteric bypass 6/7 by vascular surgery - On heparin and coumadin but since INR today 2.13 we  will resume Coumadin as prescribed 1.5 mg Monday Wednesday and Friday and 3 mg on all other days as noted above - Patient's wife at the bedside, instructed to follow-up with primary care physician by the end of the week to  recheck INR and make sure Coumadin dose is okay and then INR is within 2-3  Acute blood loss anemia / Upper GI bleed - Ischemia on EGD and ulceration throughout the colon on colonoscopy - No reports of bleeding on heparin drip and Coumadin - Hemoglobin is stable at 8.7  Paroxysmal atrial fibrillation - CHADs2vasc = 3  - Rate controlled with amiodarone: 400mg  BID x 7 days through 6/8, then 200mg  BID x 14 days started on 6/10 and ending on 6/23, followed by 200 mg daily thereafter starting on 6/24 - Coumadin on discharge as noted above  COPD/OSA - CPAP at bedtime - Stable   Obstructing right ureteral stone / Bladder outlet obstruction - 5-6 mm causing moderate right hydronephrosis, asymptomatic.  - Continue Foley catheter but this needs to be evaluated by home health nurse and removed probably by the end of this week June 17 or June 18. Preferably patient will follow-up with primary care physician who can follow-up on catheter removal as well.  Severe protein calorie malnutrition -In the context of chronic illness - Diet as tolerated   Stage 1 pressure ulcer - Per RN care - On sacrum   DVT prophylaxis: Heparin and coumadin  Code Status: Full code Family Communication: wife at the bedside 6/12    Consultants:   At [facility]: Cardiology, Gastroenterology.   At [facility]: IR, Vascular surgery  Procedures:   Aorto-mesenteric artery bypass 6/7   EGD and colonoscopy 5/30 - findings concerning for ischemia on EGD and ulceration throughout most of the colon  Antimicrobials:   None   Signed:  Leisa Lenz, MD  [facility] 02/09/2016, 12:19 PM  Pager #: [phone]  Time spent in minutes: more than 30 minutes    Discharge Exam: Filed Vitals:   02/08/16 2119 02/09/16 0456  BP: 142/65 134/62  Pulse: 80 79  Temp: 98.2 F (36.8 C) 98.4 F (36.9 C)  Resp: 17 16   Filed Vitals:   02/08/16 0442 02/08/16 1526 02/08/16 2119 02/09/16 0456  BP:  123/69 123/79 142/65 134/62  Pulse: 76 80 80 79  Temp: 98.2 F (36.8 C)  98.2 F (36.8 C) 98.4 F (36.9 C)  TempSrc: Oral  Oral Oral  Resp: 18 18 17 16   Height:      Weight:      SpO2: 98% 96% 99% 97%    General: Pt is alert, follows commands appropriately, not in acute distress Cardiovascular: Regular rate and rhythm, S1/S2 + Respiratory: Clear to auscultation bilaterally, no wheezing, no crackles, no rhonchi Abdominal: Soft, non tender, non distended, bowel sounds +, no guarding Extremities: no edema, no cyanosis, pulses palpable bilaterally DP and PT Neuro: Grossly nonfocal  Discharge Instructions  Discharge Instructions    Call MD for:  difficulty breathing, headache or visual disturbances    Complete by:  As directed      Call MD for:  persistant dizziness or light-headedness    Complete by:  As directed      Call MD for:  persistant nausea and vomiting    Complete by:  As directed      Call MD for:  severe uncontrolled pain    Complete by:  As directed      Diet - low sodium  heart healthy    Complete by:  As directed      Discharge instructions    Complete by:  As directed   Please continue Coumadin 1.5 mg at bedtime Monday, Wednesday and Friday and on all other days, Tuesday, Thursday, Saturday and Sunday take 3 mg at bedtime. Please check your INR by the end of the week, 02/12/2016 to make sure INR is between 2 and 3. INR is 2.13 today prior to discharge 02/09/2016. While on Coumadin please monitor for any sign of bleeding such as black tarry stool or blood in the stool, blood in the urine, bleeding from mucosal surfaces such as gums, nose. If you notice any sign of bleed please stop Coumadin, call your primary care physician or come to ED for evaluation as it may be a sign of active bleed.  Take amiodarone 200 mg twice daily through 02/20/2016 and starting 02/21/2016 take 200 mg every day. Follow-up with primary care physician to make sure symptoms are stable.  Your  hemoglobin is 8.7, stable prior to discharge.  Home health order placed including physical therapy and nurse. Nurse specifically to evaluate for Foley catheter removal.     Increase activity slowly    Complete by:  As directed             Medication List    STOP taking these medications        aspirin EC 81 MG tablet     digoxin 0.125 MG tablet  Commonly known as:  LANOXIN     propranolol 20 MG tablet  Commonly known as:  INDERAL     rivaroxaban 20 MG Tabs tablet  Commonly known as:  XARELTO      TAKE these medications        acetaminophen 500 MG tablet  Commonly known as:  TYLENOL  Take 500 mg by mouth every 6 (six) hours as needed for mild pain or moderate pain.     amiodarone 200 MG tablet  Commonly known as:  PACERONE  Take 1 tablet (200 mg total) by mouth 2 (two) times daily.     amiodarone 200 MG tablet  Commonly known as:  PACERONE  Take 1 tablet (200 mg total) by mouth daily.  Start taking on:  02/21/2016     famotidine 20 MG tablet  Commonly known as:  PEPCID  Take 1 tablet (20 mg total) by mouth daily.     multivitamins ther. w/minerals Tabs tablet  Take 1 tablet by mouth daily.     nicotine 14 mg/24hr patch  Commonly known as:  NICODERM CQ - dosed in mg/24 hours  Place 1 patch (14 mg total) onto the skin daily.     OXYGEN  Inhale 2 L into the lungs at bedtime.     simvastatin 40 MG tablet  Commonly known as:  ZOCOR  Take 1 tablet by mouth daily.     tamsulosin 0.4 MG Caps capsule  Commonly known as:  FLOMAX  Take 1 capsule (0.4 mg total) by mouth daily.     warfarin 3 MG tablet  Commonly known as:  COUMADIN  Take 0.5 tablets (1.5 mg total) by mouth daily at 6 PM. Take 1.5 mg Coumadin Monday, Wednesday and Friday and then on all other days take 3 mg at bedtime (Tuesday, Thursday, Saturday and Sunday)           Follow-up Information    Follow up with HAWKINS,EDWARD L, MD. Schedule an appointment as soon as possible  for a visit on  02/13/2016.   Specialty:  Pulmonary Disease   Why:  Follow up appt after recent hospitalization   Contact information:   [address] [place] [id number] [phone]        The results of significant diagnostics from this hospitalization (including imaging, microbiology, ancillary and laboratory) are listed below for reference.    Significant Diagnostic Studies: Ct Abdomen Pelvis W Wo Contrast  01/25/2016  CLINICAL DATA:  GI bleeding, prior portal venous gas on previous CT EXAM: CT ABDOMEN AND PELVIS WITHOUT AND WITH CONTRAST TECHNIQUE: Multidetector CT imaging of the abdomen and pelvis was performed following the standard protocol before and following the bolus administration of intravenous contrast. CONTRAST:  159mL ISOVUE-300 IOPAMIDOL (ISOVUE-300) INJECTION 61% COMPARISON:  01/11/2016 FINDINGS: Lung bases are free of acute infiltrate or sizable effusion. Gallbladder has been surgically removed. The previously seen portal venous air within the left lobe of the liver is no longer identified. Portal vein is well visualized and demonstrates a normal branching pattern. No portal venous thrombosis is noted. Again no portal venous air is seen. The spleen, right adrenal gland and pancreas are stable in appearance from the prior exam. Small duodenal diverticulum is seen. Left adrenal gland demonstrates some hypertrophy but stable from the prior study. The kidneys are stable in appearance with cystic change and nonobstructing renal calculi bilaterally. The bladder is decompressed by Foley catheter. Prostatic calcifications are seen. No free pelvic fluid is noted. Mild diverticular change is noted without evidence of diverticulitis. No pooling of contrast to correspond with the given clinical history of GI bleeding is noted. Aortoiliac calcifications are seen. Tortuosity of the abdominal aorta is noted. Tube graft is noted within the distal aorta related to prior surgical repair. This is  stable from the prior exam. The appendix is not well visualized although no inflammatory changes are seen. The osseous structures show a scoliosis of the lumbar spine concave to the left. This is stable from the prior exam. No acute bony abnormality is noted. IMPRESSION: Resolution of previously seen portal venous gas. No findings to correspond with the patient's given clinical history of active GI bleeding are seen. Direct visualization may be helpful. Nuclear bleeding scan may also be helpful for localization Remainder of the exam is stable from the prior study with evidence of bilateral renal calculi and right renal cystic change without obstructive change. Chronic changes as described. Electronically Signed   By: Inez Catalina M.D.   On: 01/25/2016 19:48   Ct Abdomen Pelvis W Contrast  01/11/2016  ADDENDUM REPORT: 01/11/2016 22:37 ADDENDUM: These results were called by telephone at the time of interpretation on <CurrentDate> at <CurrentTime> to Dr. Shanon Brow , who verbally acknowledged these results. Electronically Signed   By: Anner Crete M.D.   On: 01/11/2016 22:37  01/11/2016  CLINICAL DATA:  75 year old male with bilateral lower quadrant abdominal pain. EXAM: CT ABDOMEN AND PELVIS WITH CONTRAST TECHNIQUE: Multidetector CT imaging of the abdomen and pelvis was performed using the standard protocol following bolus administration of intravenous contrast. CONTRAST:  187mL ISOVUE-300 IOPAMIDOL (ISOVUE-300) INJECTION 61% COMPARISON:  None. FINDINGS: The visualized lung bases are clear. There is coronary vascular calcification. No intra-abdominal free air or free fluid. Cholecystectomy. Linear and branching air noted within the peripheral left lobe of the liver concerning for of the portal venous gas. The liver is otherwise unremarkable. The pancreas, spleen, and the right adrenal gland appear unremarkable. There is mild thickening of the left adrenal  gland. Multiple small nonobstructing bilateral renal calculi  measuring up to 4 mm. There is mild fullness of the renal collecting systems bilaterally. There is a 5 mm calculus in the proximal right ureter. There is a 2.6 cm exophytic hypodense lesion arising from the posterior cortex of the right kidney most compatible with a cyst. The urinary bladder is distended. There is trabecular appearance of the bladder wall compatible with chronic bladder outlet obstruction. The prostate gland is mildly enlarged and measures 4.5 cm in transverse diameter. There is apparent thickening of the distal stomach in the region of the antrum and pylorus which may be related to underdistention or represent gastritis. Clinical correlation is recommended. Multiple small duodenal diverticula without definite active inflammation. There is no evidence of bowel obstruction. The visualized appendix appears unremarkable. There is aortoiliac atherosclerotic disease. There is focal area of advanced atherosclerotic plaque with narrowing of the aorta just inferior to the origins of the renal arteries. The aorta is tortuous. There is atherosclerotic calcifications of the origins of the celiac axis and SMA. The origins the celiac axis, SMA appear patent. The SMV, and main portal vein are patent. No portal venous gas identified. There is no adenopathy. Set the abdominal wall soft tissues appear unremarkable. There is osteopenia with degenerative changes of the spine. No acute fracture. IMPRESSION: Linear branching air in the left lobe of the liver concerning for portal venous gas. This is of indeterminate etiology, however the possibility of ischemic bowel is not excluded. Clinical correlation is recommended. Underdistention of the stomach versus gastritis. No evidence of bowel obstruction. No CT evidence for acute appendicitis. A 5 mm proximal right ureteral calculus with small nonobstructing bilateral renal calculi. Mild fullness of the renal collecting systems bilaterally. Trabecular appearance of the  urinary bladder compatible with chronic bladder outlet obstruction. Electronically Signed: By: Anner Crete M.D. On: 01/11/2016 22:26   Dg Chest Port 1 View  02/05/2016  CLINICAL DATA:  Status post abdominal surgery EXAM: PORTABLE CHEST 1 VIEW COMPARISON:  02/04/2016 FINDINGS: There is a right IJ catheter sheath with tip in the projection of the SVC. Nasogastric tube tip is in the stomach. Normal heart size. Aortic atherosclerosis is noted. Increase in atelectasis in the right base. Again noted is of lucency along the undersurface of the right hemidiaphragm compatible with pneumoperitoneum. IMPRESSION: 1. Right base atelectasis.  Increased from previous exam. 2. Pneumoperitoneum. Electronically Signed   By: Kerby Moors M.D.   On: 02/05/2016 08:21   Dg Chest Port 1 View  02/04/2016  CLINICAL DATA:  Status post aorto mesenteric aneurysm repair, follow-up line placement. History of COPD EXAM: PORTABLE CHEST 1 VIEW COMPARISON:  Chest x-ray dated Jan 13, 2016 FINDINGS: There is chronic elevation of the right hemidiaphragm. There is air under the hemidiaphragm today. There is density in the inferior aspect of the right upper lobe. The left lung is clear. There is no pneumothorax or significant pleural effusion. The cardiac silhouette is top-normal in size but stable. The pulmonary vascularity is normal. There is an esophagogastric tube present whose tip projects below the inferior margin of the image. A right internal jugular Cordis sheath tip projects over the proximal SVC. There is kinking of this Cordis sheath as it enters the skin. IMPRESSION: 1. Subsegmental atelectasis or early infiltrate inferiorly in the right upper lobe. No pneumothorax. Small amount of pneumoperitoneum visible under the right hemidiaphram. 2. Esophagogastric tube tip projects below the inferior margin of the image but is seen to lie in the  mid gastric body on the accompanying portable abdominal exam. Electronically Signed   By: David   Martinique M.D.   On: 02/04/2016 13:58   Dg Chest Port 1 View  01/13/2016  CLINICAL DATA:  R06.02 (ICD-10-CM) - SOB (shortness of breath)COPD (chronic obstructive pulmonary disease) ([place]) J44.9Smokes 1ppd/60 yrs EXAM: PORTABLE CHEST 1 VIEW COMPARISON:  Chest CT, 07/23/2015.  Chest radiograph, 02/06/2015. FINDINGS: There is new opacity at the right lung base when compared to the prior studies silhouetting the right hemidiaphragm. Mild opacities noted in the medial left lung base. Remainder of the lungs is clear. Left lung is hyperexpanded. No pneumothorax. Cardiac silhouette is normal in size. No mediastinal or hilar masses or convincing adenopathy. Bony thorax is demineralized but grossly intact. IMPRESSION: 1. New bilateral lung base opacity, greater on the right. This may be atelectasis, pneumonia or a combination. A central obstructing lesion is not excluded. Consider followup chest CT with contrast for further assessment. 2. No evidence of pulmonary edema. Electronically Signed   By: Lajean Manes M.D.   On: 01/13/2016 11:21   Dg Abd Acute W/chest  01/25/2016  CLINICAL DATA:  Diarrhea.  Abdominal pain.  Weakness. EXAM: DG ABDOMEN ACUTE W/ 1V CHEST COMPARISON:  01/13/2016. FINDINGS: Borderline enlarged cardiac silhouette. Clear lungs. Normal bowel gas pattern without free peritoneal air. Cholecystectomy clips. 6 mm nonspecific oval calcification overlying the right lower abdomen medially. Marked dextroconvex lumbar rotary scoliosis and degenerative changes. Mild to moderate thoracic scoliosis and degenerative changes. Mild left shoulder degenerative changes. Diffuse osteopenia. Atheromatous arterial calcifications. IMPRESSION: 1. No acute abnormality. 2. 6 mm oval calcification overlying the right lower abdomen, medially. This could potentially represent a ureteral calculus or phlebolith. An appendicolith is less likely. 3. Thoracolumbar scoliosis and degenerative changes. Electronically Signed   By: Claudie Revering M.D.   On: 01/25/2016 10:18   Dg Abd Portable 1v  02/04/2016  CLINICAL DATA:  Status post abdominal aortic -mesenteric aneurysm repair, NG tube placement. EXAM: PORTABLE ABDOMEN - 1 VIEW COMPARISON:  Chest x-ray of today's date and CT scan of the abdomen of Jan 25 2016 FINDINGS: There is extraluminal gas in the right upper quadrant of the abdomen. This was seen on the accompanying chest x-ray as well. The esophagogastric tube tip lies in the mid gastric body. There is a moderate amount of gas and stool in the colon and rectum. There is a small amount of small bowel gas as well. There is stable severe dextrocurvature centered in the mid lumbar spine. IMPRESSION: Extraluminal gas within the abdominal cavity consistent with the postoperative state. The nasogastric tube is in reasonable position. Electronically Signed   By: David  Martinique M.D.   On: 02/04/2016 14:01   Ct Angio Abd/pel W/ And/or W/o  01/30/2016  ADDENDUM REPORT: 01/30/2016 14:55 ADDENDUM: 5 x 6 mm ureteral stone on the right just above the right iliac artery causing moderate hydronephrosis of the right kidney. Additional small bilateral renal calculi bilaterally. On the CT of 01/25/2016, the stone was in the proximal right ureter not causing significant obstruction. Electronically Signed   By: Franchot Gallo M.D.   On: 01/30/2016 14:55  01/30/2016  CLINICAL DATA:  75 year old male with generalized abdominal pain and recent GI bleed. Evaluate for mesenteric ischemia. EXAM: CTA ABDOMEN AND PELVIS wITHOUT AND WITH CONTRAST TECHNIQUE: Multidetector CT imaging of the abdomen and pelvis was performed using the standard protocol during bolus administration of intravenous contrast. Multiplanar reconstructed images and MIPs were obtained and reviewed to evaluate the  vascular anatomy. CONTRAST:  100 mL Isovue 370 COMPARISON:  Prior CTA abdomen and pelvis 01/25/2016 FINDINGS: VASCULAR Aorta: Surgical changes of prior tube graft repair of reported abdominal  aortic aneurysm. There is focal narrowing of the aortic lumen in the region of the proximal anastomosis. The aortic lumen measures only 12 mm at this location in there is extensive peripheral calcification. No evidence of aneurysm or pseudoaneurysm. Celiac: Heavily diseased origin the celiac artery secondary to bulky calcified atherosclerotic plaque. There is either high-grade stenosis or focal occlusion of the origin of the celiac artery. The splenic artery is relatively small in caliber. Conventional hepatic arterial anatomy. SMA: Approximately 1.5 cm occlusion of the origin of the superior mesenteric artery. There is bulky calcified plaque surrounding the origin. Renals: Solitary bilateral renal arteries. Predominately calcified plaque results in at least moderate stenosis of the left renal artery origin. Heterogeneous fibro fatty and calcified plaque results in mild narrowing of the right renal artery origin. No renal artery aneurysm or fibromuscular dysplasia. IMA: The origin has been ligated. The distal branches opacified via retrograde flow. Inflow: Relatively high-grade focal stenoses of the most proximal bilateral iliac arteries secondary to bulky calcified plaque. Mild and possibly poststenotic aneurysmal dilatation of the iliac arteries measuring up to 1.9 cm on the right and 1.6 cm on the left. The bilateral internal and external iliac arteries remain patent. Proximal Outflow: Bulky calcified atherosclerotic plaque along the posterior wall of the common femoral arteries bilaterally. The visualized proximal profunda and superficial femoral arteries remain patent. Veins: No focal venous abnormality. NON-VASCULAR Lower Chest: Small bilateral layering pleural effusions. There is associated lower lobe atelectasis mild lower lobe bronchial wall thickening. Cardiomegaly. Atherosclerotic calcifications noted along the visualized coronary arteries. No pericardial effusion. Unremarkable distal thoracic esophagus.  Abdomen: Unremarkable CT appearance of the pancreas, spleen, and adrenal glands for age. Grossly unremarkable stomach and duodenum. No evidence of obstruction or focal bowel wall thickening. No focal pneumatosis or free air. Normal hepatic contour and morphology. No discrete hepatic lesions. The gallbladder is surgically absent. No intra or extrahepatic biliary ductal dilatation. Mild periportal edema. No evidence of portal venous gas. Diffuse mild mesenteric edema.  Trace free fluid. Pelvis: Foley catheter is noted within the bladder. The bladder wall appears somewhat thickened although evaluation is limited by underdistention. Bones/Soft Tissues: Mild anasarca. No acute fracture or aggressive appearing lytic or blastic osseous lesion. Degenerative dextro convex scoliosis centered at L1-L2. Review of the MIP images confirms the above findings. IMPRESSION: VASCULAR 1. Severe and largely heavily calcified vascular disease with high-grade stenosis versus occlusion of the celiac artery, occlusion of the origin of the superior mesenteric artery and surgical ligation of the inferior mesenteric artery. These findings are highly consistent with a chronic mesenteric ischemia affecting the entirety of the gastrointestinal tract. Nearly all flow to the gastrointestinal tract may be via collateral pathways. 2. Surgical changes of prior infrarenal abdominal aortic repair with aortic tube graft extending from the infrarenal aorta to just proximal to the aortic bifurcation. 3. Focal moderate calcified stenosis of the infrarenal abdominal aorta in the region of the proximal tube graft anastomosis. 4. Focal high-grade calcified stenoses of the bilateral common iliac artery origins followed by poststenotic dilatation as described above. 5. Bulky calcified atherosclerotic plaque in both common femoral arteries resulting in mild to moderate stenosis. 6. Primarily fibro fatty plaque results in a mild narrowing of the right renal artery  origin. 7. Calcified plaque results in moderate narrowing of the left renal artery origin.  NON VASCULAR 1. No evidence of pneumatosis, portal venous air, or free air. 2. No evidence of obstruction or focal bowel wall thickening. 3. Bilateral pleural effusions, periportal edema, anasarca, small volume ascites and mesenteric edema all consistent with volume overload versus CHF or third-spacing. 4. Bilateral lower lobe atelectasis and bronchial wall thickening. 5. Additional ancillary findings as above without significant interval change. Signed, Criselda Peaches, MD [facility] Electronically Signed: By: Jacqulynn Cadet M.D. On: 01/29/2016 11:35    Microbiology: Recent Results (from the past 240 hour(s))  Surgical pcr screen     Status: Abnormal   Collection Time: 02/03/16  4:59 PM  Result Value Ref Range Status   MRSA, PCR NEGATIVE NEGATIVE Final   Staphylococcus aureus POSITIVE (A) NEGATIVE Final     Labs: Basic Metabolic Panel:  Recent Labs Lab 02/05/16 0422 02/06/16 0514 02/07/16 0350 02/08/16 0308 02/09/16 0350  NA 134* 134* 136 135 137  K 4.0 4.0 4.6 3.9 4.2  CL 102 101 102 103 101  CO2 29 29 31 27 31   GLUCOSE 129* 118* 97 114* 117*  BUN 5* <5* <5* <5* <5*  CREATININE 0.48* 0.55* 0.57* 0.54* 0.59*  CALCIUM 7.6* 7.8* 8.1* 7.9* 8.4*  MG 2.0 1.8 1.7 1.5* 1.5*   Liver Function Tests:  Recent Labs Lab 02/05/16 0422  AST 16  ALT 12*  ALKPHOS 91  BILITOT 0.6  PROT 4.3*  ALBUMIN 2.1*    Recent Labs Lab 02/05/16 0422  AMYLASE 199*   No results for input(s): AMMONIA in the last 168 hours. CBC:  Recent Labs Lab 02/05/16 0422 02/06/16 0514 02/07/16 0350 02/08/16 0308 02/09/16 0350  WBC 17.4* 12.8* 8.9 7.8 7.6  HGB 9.2* 8.2* 8.2* 7.8* 8.7*  HCT 30.2* 26.8* 26.7* 25.9* 29.0*  MCV 84.6 85.1 84.8 85.5 84.3  PLT 198 187 192 193 212   Cardiac Enzymes: No results for input(s): CKTOTAL, CKMB, CKMBINDEX,  TROPONINI in the last 168 hours. BNP: BNP (last 3 results) No results for input(s): BNP in the last 8760 hours.  ProBNP (last 3 results) No results for input(s): PROBNP in the last 8760 hours.  CBG: No results for input(s): GLUCAP in the last 168 hours.

## 2016-02-09 NOTE — Progress Notes (Signed)
Physical Therapy Treatment Patient Details Name: Steve Gomez MRN: [id number] DOB: 01/25/1941 Today's Date: Feb 24, 2016    History of Present Illness 75 y/o male admitted to [facility] with chief complaints of abdominal pain and rectal bleeding. He has a history of AAA repair 20 years ago, COPD, Hypertension, And A fib.  Pt was found to have mesenteric ischemia.  He underwent mesenteric artery repair 02/04/16    PT Comments    Pt making good progress.  Follow Up Recommendations  Home health PT     Equipment Recommendations  Rolling walker with 5" wheels;3in1 (PT)    Recommendations for Other Services       Precautions / Restrictions Precautions Precautions: Fall Restrictions Weight Bearing Restrictions: No    Mobility  Bed Mobility Overal bed mobility: Modified Independent Bed Mobility: Supine to Sit     Supine to sit: Modified independent (Device/Increase time)     General bed mobility comments: Incr time  Transfers Overall transfer level: Needs assistance Equipment used: Rolling walker (2 wheeled) Transfers: Sit to/from Stand Sit to Stand: Supervision         General transfer comment: Verbal cues for hand placement.  Ambulation/Gait Ambulation/Gait assistance: Min guard;Supervision Ambulation Distance (Feet): 450 Feet Assistive device: Rolling walker (2 wheeled) Gait Pattern/deviations: Step-through pattern;Decreased stride length     General Gait Details: Pt able to handle walker adequately.   Stairs            Wheelchair Mobility    Modified Rankin (Stroke Patients Only)       Balance Overall balance assessment: Needs assistance Sitting-balance support: Feet supported;No upper extremity supported Sitting balance-Leahy Scale: Fair     Standing balance support: Bilateral upper extremity supported Standing balance-Leahy Scale: Poor Standing balance comment: walker and supervsion for static standing                    Cognition  Arousal/Alertness: Awake/alert Behavior During Therapy: WFL for tasks assessed/performed Overall Cognitive Status: Within Functional Limits for tasks assessed                      Exercises      General Comments        Pertinent Vitals/Pain Pain Assessment: No/denies pain    Home Living                      Prior Function            PT Goals (current goals can now be found in the care plan section) Progress towards PT goals: Progressing toward goals    Frequency  Min 3X/week    PT Plan Current plan remains appropriate    Co-evaluation             End of Session Equipment Utilized During Treatment: Gait belt Activity Tolerance: Patient tolerated treatment well Patient left: with call bell/phone within reach;with family/visitor present;in chair     Time: [id number] PT Time Calculation (min) (ACUTE ONLY): 19 min  Charges:  $Gait Training: 8-22 mins                    G Codes:      Doni Widmer 2016-02-24, 12:07 PM [employer] PT [phone]

## 2016-02-09 NOTE — Care Management Note (Signed)
Case Management Note  Patient Details  Name: Steve Gomez MRN: [id number] Date of Birth: Jun 11, 1941  Subjective/Objective:     Pt admitted with mesenteric ischemia               Action/Plan:  PTA - independent from home with wife.  Wife will provide recommended supervision at discharge.  CM will continue to follow for discharge needs   Expected Discharge Date:    02/09/16              Expected Discharge Plan:  [place]  In-House Referral:  NA  Discharge planning Services  CM Consult  Post Acute Care Choice:  Home Health Choice offered to:  Patient, Spouse  DME Arranged:  Walker rolling DME Agency:  [place] Arranged:  RN, PT, OT, Nurse's Aide [place] Agency:  [place]  Status of Service:  Completed, signed off  Medicare Important Message Given:  Yes Date Medicare IM Given:    Medicare IM give by:    Date Additional Medicare IM Given:    Additional Medicare Important Message give by:     If discussed at [place] of Stay Meetings, dates discussed:    Additional Comments:  02/09/16- 12- Marvetta Gibbons RN, BSN- pt for d/c home today- orders placed for HH-RN/PT/OT/aide- in to speak with pt and wife at bedside- choice offered for [facility] agency in [facility]- per choice wife would like to use [facility] for [facility] services as long as they are in network with insurance- referral called to [facility] with [facility]- referral accepted [facility] is in-network)- pt also will need RW for home- order placed- spoke with Jeneen Rinks at [facility] regarding DME need- RW to be delivered to room prior to discharge.    CM requested via physician sticky tab for HH/DME orders - neither service has been arranged as of yet.  CM provided pt and wife with choice list for recommended services - pt request follow up by CM during weekend to finalize choice.  CM will continue to monitor for disposition needs.  Dahlia Client [facility], RN 02/09/2016, 2:28 PM [phone]

## 2016-02-09 NOTE — Progress Notes (Addendum)
[facility] [place]  Subjective  - Tolerated full liquid diet yesterday without N/V.   Objective 134/62 79 98.4 F (36.9 C) (Oral) 16 97%  Intake/Output Summary (Last 24 hours) at 02/09/16 [id number] Last data filed at 02/08/16 2352  Gross per 24 hour  Intake   2888 ml  Output   4700 ml  Net  -1812 ml    Feet warm well perfused Abdomin soft, hypo bowel sounds Heart A fib Lungs non labored  Assessment/Planning: POD # 4 AORTO-SUPERIOR MESENTERIC ARTERY BYPASS GRAFT USING 6MM X 30 CM HEMASHIELD GOLD GRAFT (N/A)  Will advance diet to regular today Encourage ambulation Coumadin bridge with heparin UO excellent coude cath in place   [place], [facility] 02/09/2016 7:21 AM --  Laboratory Lab Results:  Recent Labs  02/08/16 0308 02/09/16 0350  WBC 7.8 7.6  HGB 7.8* 8.7*  HCT 25.9* 29.0*  PLT 193 212   BMET  Recent Labs  02/08/16 0308 02/09/16 0350  NA 135 137  K 3.9 4.2  CL 103 101  CO2 27 31  GLUCOSE 114* 117*  BUN <5* <5*  CREATININE 0.54* 0.59*  CALCIUM 7.9* 8.4*    COAG Lab Results  Component Value Date   INR 2.13* 02/09/2016   INR 1.60* 02/08/2016   INR 1.59* 02/07/2016   No results found for: PTT    I have examined the patient, reviewed and agree with above.Looks great. Once to go home. Abdomen soft and nontender with the nicely healing midline incision. Stable for discharge to home. Will follow-up in the office in 2-3 weeks  Curt Jews, MD 02/09/2016 3:27 PM

## 2016-02-09 NOTE — Progress Notes (Signed)
Nutrition Follow-up  DOCUMENTATION CODES:   Severe malnutrition in context of chronic illness, Underweight  INTERVENTION:  Ensure Enlive TID. Each supplement provides 350 kcals and 20 grams of protein.    NUTRITION DIAGNOSIS:   Malnutrition related to chronic illness as evidenced by severe depletion of body fat, severe depletion of muscle mass, percent weight loss (15% in 6 months).  Ongoing   GOAL:   Patient will meet greater than or equal to 90% of their needs  Progressing  MONITOR:   PO intake, Supplement acceptance, Diet advancement, Labs, Weight trends, Skin, I & O's  ASSESSMENT:   Pt  with medical history significant of COPD, hypertension, hypercholesterolemia, recently diagnosed atrial fibrillation on her alto who presents to the emergency department with complaints of dark looking diarrhea. Patient was recently discharged from [facility] on 01/17/2016, admitted for workup of of abdominal pain with unclear etiology. Patient was discharged home on by mouth Ceftin. During this time, patient was also found to have new onset atrial fibrillation improved with beta blocker. Patient was transitioned from heparin to Xarelto on 01/16/2016.   Pt reports that he has not been receiving Ensure since moving to 2W on 6/9.  Will order Ensure TID.  Pt had eggs for breakfast and denies N/V or abdominal pain. Pt tolerating regular diet. PO's improving-->75-100% for past 2 days. Weight: 117-->111 lbs. Pt has lost 6 lbs since admission on 5/28; 5% in 2 weeks. This is severe for time frame. Continue to encourage PO's and nutrition supplementation.  Labs reviewed; BUN <5, creat 0.59, Ca 8.4, mag 1.5, glucose 117.  Meds reviewed; Warfarin.  Diet Order:  Diet regular Room service appropriate?: Yes; Fluid consistency:: Thin Diet - low sodium heart healthy  Skin:  Wound (see comment) (Stage I pressure ulcer on buttocks)  Last BM:  6/10  Height:   Ht Readings from Last 1  Encounters:  02/01/16 5\' 4"  (1.626 m)    Weight:   Wt Readings from Last 1 Encounters:  02/06/16 111 lb 1.8 oz (50.4 kg)    Ideal Body Weight:  59 kg  BMI:  Body mass index is 19.06 kg/(m^2).  Estimated Nutritional Needs:   Kcal:  1600-1800  Protein:  70-80 g  Fluid:  1.6 - 1.8 L/day  EDUCATION NEEDS:   No education needs identified at this time  Geoffery Lyons, [place] Dietetic Intern Pager [phone]

## 2016-02-10 ENCOUNTER — Telehealth: Payer: Self-pay | Admitting: Vascular Surgery

## 2016-02-10 ENCOUNTER — Encounter: Payer: Self-pay | Admitting: *Deleted

## 2016-02-10 ENCOUNTER — Ambulatory Visit (INDEPENDENT_AMBULATORY_CARE_PROVIDER_SITE_OTHER): Payer: Commercial Managed Care - HMO | Admitting: *Deleted

## 2016-02-10 ENCOUNTER — Other Ambulatory Visit: Payer: Self-pay | Admitting: *Deleted

## 2016-02-10 DIAGNOSIS — Z9981 Dependence on supplemental oxygen: Secondary | ICD-10-CM | POA: Diagnosis not present

## 2016-02-10 DIAGNOSIS — E43 Unspecified severe protein-calorie malnutrition: Secondary | ICD-10-CM | POA: Diagnosis not present

## 2016-02-10 DIAGNOSIS — Z466 Encounter for fitting and adjustment of urinary device: Secondary | ICD-10-CM | POA: Diagnosis not present

## 2016-02-10 DIAGNOSIS — F17200 Nicotine dependence, unspecified, uncomplicated: Secondary | ICD-10-CM

## 2016-02-10 DIAGNOSIS — E785 Hyperlipidemia, unspecified: Secondary | ICD-10-CM | POA: Diagnosis not present

## 2016-02-10 DIAGNOSIS — Z48815 Encounter for surgical aftercare following surgery on the digestive system: Secondary | ICD-10-CM | POA: Diagnosis not present

## 2016-02-10 DIAGNOSIS — I4891 Unspecified atrial fibrillation: Secondary | ICD-10-CM | POA: Diagnosis not present

## 2016-02-10 DIAGNOSIS — Z5181 Encounter for therapeutic drug level monitoring: Secondary | ICD-10-CM | POA: Diagnosis not present

## 2016-02-10 DIAGNOSIS — J449 Chronic obstructive pulmonary disease, unspecified: Secondary | ICD-10-CM | POA: Diagnosis not present

## 2016-02-10 DIAGNOSIS — Z7901 Long term (current) use of anticoagulants: Secondary | ICD-10-CM | POA: Diagnosis not present

## 2016-02-10 LAB — POCT INR: INR: 3.2

## 2016-02-10 NOTE — Telephone Encounter (Signed)
Sched appt 6/27 at 8:45. Spoke to pt's wife to inform them of appt.

## 2016-02-10 NOTE — Telephone Encounter (Signed)
-----   Message from Mena Goes, RN sent at 02/09/2016  3:42 PM EDT ----- Regarding: schedule   ----- Message -----    From: [name], MD    Sent: 02/09/2016   3:28 PM      To: Vvs Charge Pool  Patient to be discharged to home today by hospitalist. I need to see him in the office in 2-3 weeks. Does not need any noninvasive studies

## 2016-02-10 NOTE — Patient Outreach (Signed)
[name]) Care Management Transition of Care Outreach #1  02/10/2016  Steve Gomez [birth year] [id number]  Steve Gomez is a 75 year old male with medical history significant of COPD, hypertension, hypercholesterolemia, and recently diagnosed atrial fibrillation on Xarelto. Steve Gomez was discharged to home on 01/17/16 after an admission for abdominal pain and new Afib with RVR.  Steve Gomez had extensive workup of abdominal pain and was discharged to home on Xarelto for his afib. He presented to the emergency department on 01/25/16 with complaints of dark looking diarrhea. Hemoglobin was noted to be 7.4. WBC was noted be 23.5. 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 He was eventually taken to the OR for aorto-mesenteric bypass 6/7 by vascular surgery.   Upon Discharge, Steve Gomez was given the following instructions which I reviewed with him today:    Follow up with PCP: Dr. Sinda Du (I reached out to office requesting a call to patient/wife to schedule appointment; Dr. Luan Pulling is out of office this week; patient will see cardiologist this week for 5 day post hospital afib visit)   Follow up with Cardiology for afib: Steve Gomez heard from the office yesterday and they are to return a call to her today re: follow up appointment; I also called and left a voice message with the office requesting follow up for appt  Follow up Dr. Donnetta Hutching: confirmed with patient/sife that appointment is set for Tuesday, 02/24/16 @ 8:45am  Follow up [facility]: wife called office this morning and awaits return call for appointment; she is to call me if she has difficulty making contact  Follow up with [place] for HHPT, OT, Nursing, NA: [facility] rep has called and scheduled a visit for today   Medications: Patient was recently discharged from hospital and  all medications have been reviewed.   Steve Gomez requests pharmacy assistance to help with cost of Nicotine patches and overall medication assistance; I referred to pharmacy team.   Taking Amiodarone 200mg  twice daily through 02/20/16 and starting 02/21/16 taking 200mg  daily.  Taking Coumadin 1.5mg  at 6pm Monday, Wednesday, Friday and 3mg  at 6pm on Tuesday, Thursday, Saturday, and Sunday.   We reviewed signs and symptoms of bleeding including black tarry stool/blood in the stool, blood in the urine, bleeding mucosal surfaces such as gums, nose.   DME:  Steve Gomez's CPAP machine is setting off an alarm which is requiring that the machine be unplugged and turned back on/reset; Steve Gomez is going to call [facility] now; she will return a call to me if she has any trouble reaching them. Steve Gomez has ambulated with his rolling walker this morning.  Diet: Steve Gomez was instructed to "take it easy"/go slowly on increasing p.o intake; this morning, he tolerated 3/4 can of Ensure and 1 and 1/2 small pancakes without any difficulty.   Symptoms: Steve Gomez reports that Steve Gomez slept fairly well last evening, does not have pain this morning, has ambulated, and is alert and oriented. He is currently sitting in his living room chair watching TV.   Plan: I will follow up with Steve Gomez and his wife at least weekly for the next 4 weeks and will plan a face to face visit with him based on home care visit schedule and his needs.    [place] [facility]  [phone]

## 2016-02-12 ENCOUNTER — Ambulatory Visit (INDEPENDENT_AMBULATORY_CARE_PROVIDER_SITE_OTHER): Payer: Commercial Managed Care - HMO | Admitting: *Deleted

## 2016-02-12 ENCOUNTER — Ambulatory Visit (INDEPENDENT_AMBULATORY_CARE_PROVIDER_SITE_OTHER): Payer: Commercial Managed Care - HMO | Admitting: Cardiovascular Disease

## 2016-02-12 ENCOUNTER — Telehealth: Payer: Self-pay | Admitting: *Deleted

## 2016-02-12 ENCOUNTER — Encounter: Payer: Self-pay | Admitting: Cardiovascular Disease

## 2016-02-12 VITALS — BP 85/65 | HR 87 | Ht 60.0 in | Wt 103.0 lb

## 2016-02-12 DIAGNOSIS — I9589 Other hypotension: Secondary | ICD-10-CM

## 2016-02-12 DIAGNOSIS — Z466 Encounter for fitting and adjustment of urinary device: Secondary | ICD-10-CM | POA: Diagnosis not present

## 2016-02-12 DIAGNOSIS — I4891 Unspecified atrial fibrillation: Secondary | ICD-10-CM | POA: Diagnosis not present

## 2016-02-12 DIAGNOSIS — Z5181 Encounter for therapeutic drug level monitoring: Secondary | ICD-10-CM

## 2016-02-12 DIAGNOSIS — E785 Hyperlipidemia, unspecified: Secondary | ICD-10-CM | POA: Diagnosis not present

## 2016-02-12 DIAGNOSIS — Z7901 Long term (current) use of anticoagulants: Secondary | ICD-10-CM | POA: Diagnosis not present

## 2016-02-12 DIAGNOSIS — Z9981 Dependence on supplemental oxygen: Secondary | ICD-10-CM | POA: Diagnosis not present

## 2016-02-12 DIAGNOSIS — J449 Chronic obstructive pulmonary disease, unspecified: Secondary | ICD-10-CM | POA: Diagnosis not present

## 2016-02-12 DIAGNOSIS — E43 Unspecified severe protein-calorie malnutrition: Secondary | ICD-10-CM | POA: Diagnosis not present

## 2016-02-12 DIAGNOSIS — Z48815 Encounter for surgical aftercare following surgery on the digestive system: Secondary | ICD-10-CM | POA: Diagnosis not present

## 2016-02-12 LAB — POCT INR: INR: 2.2

## 2016-02-12 NOTE — Telephone Encounter (Signed)
Done.  See coumadin note. 

## 2016-02-12 NOTE — Progress Notes (Signed)
Cardiology Office Note   Date:  02/12/2016   ID:  Steve Gomez, DOB 11-14-1940, MRN [id number]  PCP:  Alonza Bogus, MD  Cardiologist:   Jenkins Rouge, MD   No chief complaint on file.     History of Present Illness: Steve Gomez is a 75 y.o. male who presents for post hospital f/u Seen in consult 01/14/16 for afib.     Steve Gomez is a 75 y.o. male with a history of AAA s/p repair, HLD, HTN and COPD/tobacco abuse who presented to [facility] ED on 01/12/16 with abdominal pain with N/V. CT findings concerning for portal venous gas and found to be in afib with RVR and he was transferred to [facility] for further evaluation and treatment.   He presented to [facility]  with abdominal pain x2 weeks and n/v prompting him to seek medical attention. Found to be in afib with RVR and started on IV heparin and cardizem gtt. Cardizem later discontinued due to hypotension. Now only on labetelol 100mg  BID and digoxin 0.125mg  daily. CT scan of abdomen reporting linear branching air in the left lobe of liver concerning for portal venous gas. Also reporting underdistention of stomach versus gastritis and multiple nonobstructing bilateral renal calculi. Surgery was consulted. He has been treated with NPO, IVFs and pain meds. Abdominal pain has improved with conservative measures but he developed suprapubic pain and urology has been consulted. He had acute urinary retention and he released 1000 cc urine after coude cath. They recommend keeping foley in until patient can void and walk to the bathroom by himself. He has been on IV abx and white count is improving.   Cardiology consulted for help with afib. He has no past cardiac history and has never seen a cardiologist. Patient has no awareness of dysrhythmia even with fast rates. No palpitations, CP or SOB. No LE edema, orthopnea or PND. No dizziness or syncope. He does get some mild SOB with exertion. He has a 40 pack year history of smoking. He says he has had no  appetite and significant weight loss over the past year.   Long hospital stay Eventually had vascular surgery with bypass of mesenteric vessels. Transfused for anemia Due to bowel ischemia. Protein calorie malnutrition. NOAC stopped and placed on coumadin followed by home Health Checked today and it was 2.3.  Wife having a hard time getting him to eat. Weight low. BP low in office Today but asymtomatic.  No dizziness, chest pain dyspnea No falls. Abdomen not tender and wounds healing  Well Foley removed today and he has not urinated yet  Past Medical History  Diagnosis Date  . COPD (chronic obstructive pulmonary disease) ([place])   . Hypertension   . High cholesterol   . S/P AAA repair   . Renal calculi 01/14/2016  . New onset atrial fibrillation ([place])   . Bladder outlet obstruction 01/14/2016  . Atherosclerosis   . Tobacco abuse     Past Surgical History  Procedure Laterality Date  . Cholecystectomy    . Abdominal aortic aneurysm repair    . Esophagogastroduodenoscopy N/A 01/27/2016    Procedure: ESOPHAGOGASTRODUODENOSCOPY (EGD);  Surgeon: [name], MD;  Location: [facility] ENDO SUITE;  Service: Endoscopy;  Laterality: N/A;  . Colonoscopy N/A 01/27/2016    Procedure: COLONOSCOPY;  Surgeon: [name], MD;  Location: [facility] ENDO SUITE;  Service: Endoscopy;  Laterality: N/A;  . Mesenteric artery bypass N/A 02/04/2016    Procedure: AORTO-SUPERIOR MESENTERIC ARTERY BYPASS GRAFT USING 6MM X  [place];  Surgeon: [name], MD;  Location: [facility] OR;  Service: Vascular;  Laterality: N/A;     Current Outpatient Prescriptions  Medication Sig Dispense Refill  . acetaminophen (TYLENOL) 500 MG tablet Take 500 mg by mouth every 6 (six) hours as needed for mild pain or moderate pain.    Marland Kitchen amiodarone (PACERONE) 200 MG tablet Take 1 tablet (200 mg total) by mouth 2 (two) times daily. 24 tablet 0  . [START ON 02/21/2016] amiodarone (PACERONE) 200 MG tablet Take 1 tablet (200 mg total) by  mouth daily. 30 tablet 0  . famotidine (PEPCID) 20 MG tablet Take 1 tablet (20 mg total) by mouth daily. 30 tablet 0  . Multiple Vitamins-Minerals (MULTIVITAMINS THER. W/MINERALS) TABS tablet Take 1 tablet by mouth daily.    . nicotine (NICODERM CQ - DOSED IN MG/24 HOURS) 14 mg/24hr patch Place 1 patch (14 mg total) onto the skin daily. 28 patch 0  . OXYGEN Inhale 2 L into the lungs at bedtime.    . simvastatin (ZOCOR) 40 MG tablet Take 1 tablet by mouth daily.    . tamsulosin (FLOMAX) 0.4 MG CAPS capsule Take 1 capsule (0.4 mg total) by mouth daily. 30 capsule 0  . warfarin (COUMADIN) 3 MG tablet Take 0.5 tablets (1.5 mg total) by mouth daily at 6 PM. Take 1.5 mg Coumadin Monday, Wednesday and Friday and then on all other days take 3 mg at bedtime (Tuesday, Thursday, Saturday and Sunday) 90 tablet 0   No current facility-administered medications for this visit.    Allergies:   Review of patient's allergies indicates no known allergies.    Social History:  The patient  reports that he quit smoking today. His smoking use included Cigarettes. He started smoking about 55 years ago. He smoked 1.00 pack per day. He does not have any smokeless tobacco history on file. He reports that he drinks alcohol. He reports that he does not use illicit drugs.   Family History:  The patient's family history includes Diabetes in his brother, brother, and brother; Suicidality in his father.    ROS:  Please see the history of present illness.   Otherwise, review of systems are positive for none.   All other systems are reviewed and negative.    PHYSICAL EXAM: BP 68/40 mmHg  Pulse 87  Ht 5' (1.524 m)  Wt 103 lb (46.72 kg)  BMI 20.12 kg/m2  SpO2 95%  Manual BP right arm 85/65   Affect appropriate Chronically ill malnourished male  HEENT: normal Neck supple with no adenopathy JVP normal no bruits no thyromegaly Lungs clear with no wheezing and good diaphragmatic motion Heart:  S1/S2 no murmur, no rub,  gallop or click PMI normal Abdomen: benighn, BS positve, no tenderness, no AAA wounds healing well Bladder not distended  no bruit.  No HSM or HJR Distal pulses intact with no bruits No edema Neuro non-focal Skin warm and dry No muscular weakness    EKG: afib 95 nonspecific ST changes    Recent Labs: 01/14/2016: TSH 1.248 02/05/2016: ALT 12* 02/09/2016: BUN <5*; Creatinine, Ser 0.59*; Hemoglobin 8.7*; Magnesium 1.5*; Platelets 212; Potassium 4.2; Sodium 137    Lipid Panel    Component Value Date/Time   CHOL 77 01/15/2016 0223   TRIG 107 01/15/2016 0223   HDL 20* 01/15/2016 0223   CHOLHDL 3.9 01/15/2016 0223   VLDL 21 01/15/2016 0223   LDLCALC 36 01/15/2016 0223      Wt  Readings from Last 3 Encounters:  02/12/16 103 lb (46.72 kg)  02/06/16 111 lb 1.8 oz (50.4 kg)  01/23/16 117 lb (53.071 kg)      Other studies Reviewed: Additional studies/ records that were reviewed today include: Hospital records May consult  ECG and echo Op note     ASSESSMENT AND PLAN:  1.  Afib:  Rx limited by hypotension back on coumadin INR Rx today would not pursue [facility] at this time Given overall poor functional status 2. Hypotension: asymptomatic. Not on any other meds but flomax check Hb/Hct today hydrate to ER If he has any symptoms 3. Urology:  Foley out told to go to ER for bladder scan if he does not urinate in next 24 hrs 4. Bowel Ischemia:  No pain wounds healing well nutrition and diet an issue f/u [facility]   Current medicines are reviewed at length with the patient today.  The patient does not have concerns regarding medicines.  The following changes have been made:  no change  Labs Ordered Hb/Hct   Disposition:   FU with NP 2 weeks      Signed, Jenkins Rouge, MD  02/12/2016 3:34 PM    [facility] [address], [place], [place]  [address] Phone: [phone]; Fax: [phone]

## 2016-02-12 NOTE — Patient Instructions (Signed)
Your physician recommends that you schedule a follow-up appointment in:  2 weeks with [facility] cardiologist   Get Lab work done NOW, CBC    Your physician recommends that you continue on your current medications as directed. Please refer to the Current Medication list given to you today.      Thank you for choosing [facility] !

## 2016-02-13 LAB — CBC
HCT: 28.6 % — ABNORMAL LOW (ref 38.5–50.0)
HEMOGLOBIN: 8.7 g/dL — AB (ref 13.2–17.1)
MCH: 25.7 pg — ABNORMAL LOW (ref 27.0–33.0)
MCHC: 30.4 g/dL — AB (ref 32.0–36.0)
MCV: 84.6 fL (ref 80.0–100.0)
MPV: 9.3 fL (ref 7.5–12.5)
Platelets: 249 10*3/uL (ref 140–400)
RBC: 3.38 MIL/uL — AB (ref 4.20–5.80)
RDW: 15.1 % — ABNORMAL HIGH (ref 11.0–15.0)
WBC: 8.1 10*3/uL (ref 3.8–10.8)

## 2016-02-16 ENCOUNTER — Ambulatory Visit (INDEPENDENT_AMBULATORY_CARE_PROVIDER_SITE_OTHER): Payer: Commercial Managed Care - HMO | Admitting: *Deleted

## 2016-02-16 ENCOUNTER — Telehealth: Payer: Self-pay | Admitting: *Deleted

## 2016-02-16 DIAGNOSIS — Z5181 Encounter for therapeutic drug level monitoring: Secondary | ICD-10-CM

## 2016-02-16 DIAGNOSIS — Z9981 Dependence on supplemental oxygen: Secondary | ICD-10-CM | POA: Diagnosis not present

## 2016-02-16 DIAGNOSIS — E785 Hyperlipidemia, unspecified: Secondary | ICD-10-CM | POA: Diagnosis not present

## 2016-02-16 DIAGNOSIS — Z48815 Encounter for surgical aftercare following surgery on the digestive system: Secondary | ICD-10-CM | POA: Diagnosis not present

## 2016-02-16 DIAGNOSIS — I4891 Unspecified atrial fibrillation: Secondary | ICD-10-CM | POA: Diagnosis not present

## 2016-02-16 DIAGNOSIS — Z7901 Long term (current) use of anticoagulants: Secondary | ICD-10-CM | POA: Diagnosis not present

## 2016-02-16 DIAGNOSIS — Z466 Encounter for fitting and adjustment of urinary device: Secondary | ICD-10-CM | POA: Diagnosis not present

## 2016-02-16 DIAGNOSIS — J449 Chronic obstructive pulmonary disease, unspecified: Secondary | ICD-10-CM | POA: Diagnosis not present

## 2016-02-16 DIAGNOSIS — E43 Unspecified severe protein-calorie malnutrition: Secondary | ICD-10-CM | POA: Diagnosis not present

## 2016-02-16 LAB — POCT INR: INR: 4.4

## 2016-02-16 NOTE — Telephone Encounter (Signed)
INR - 4.4 / please call with instructions / tg

## 2016-02-17 DIAGNOSIS — Z7901 Long term (current) use of anticoagulants: Secondary | ICD-10-CM | POA: Diagnosis not present

## 2016-02-17 DIAGNOSIS — I10 Essential (primary) hypertension: Secondary | ICD-10-CM | POA: Diagnosis not present

## 2016-02-17 DIAGNOSIS — E43 Unspecified severe protein-calorie malnutrition: Secondary | ICD-10-CM | POA: Diagnosis not present

## 2016-02-17 DIAGNOSIS — E785 Hyperlipidemia, unspecified: Secondary | ICD-10-CM | POA: Diagnosis not present

## 2016-02-17 DIAGNOSIS — Z48815 Encounter for surgical aftercare following surgery on the digestive system: Secondary | ICD-10-CM | POA: Diagnosis not present

## 2016-02-17 DIAGNOSIS — G4733 Obstructive sleep apnea (adult) (pediatric): Secondary | ICD-10-CM | POA: Diagnosis not present

## 2016-02-17 DIAGNOSIS — Z5181 Encounter for therapeutic drug level monitoring: Secondary | ICD-10-CM | POA: Diagnosis not present

## 2016-02-17 DIAGNOSIS — Z9981 Dependence on supplemental oxygen: Secondary | ICD-10-CM | POA: Diagnosis not present

## 2016-02-17 DIAGNOSIS — Z466 Encounter for fitting and adjustment of urinary device: Secondary | ICD-10-CM | POA: Diagnosis not present

## 2016-02-17 DIAGNOSIS — R634 Abnormal weight loss: Secondary | ICD-10-CM | POA: Diagnosis not present

## 2016-02-17 DIAGNOSIS — I4891 Unspecified atrial fibrillation: Secondary | ICD-10-CM | POA: Diagnosis not present

## 2016-02-17 DIAGNOSIS — J449 Chronic obstructive pulmonary disease, unspecified: Secondary | ICD-10-CM | POA: Diagnosis not present

## 2016-02-18 ENCOUNTER — Encounter: Payer: Self-pay | Admitting: Vascular Surgery

## 2016-02-18 ENCOUNTER — Other Ambulatory Visit: Payer: Self-pay | Admitting: Pharmacist

## 2016-02-18 ENCOUNTER — Other Ambulatory Visit: Payer: Self-pay | Admitting: *Deleted

## 2016-02-18 DIAGNOSIS — Z9981 Dependence on supplemental oxygen: Secondary | ICD-10-CM | POA: Diagnosis not present

## 2016-02-18 DIAGNOSIS — Z7901 Long term (current) use of anticoagulants: Secondary | ICD-10-CM | POA: Diagnosis not present

## 2016-02-18 DIAGNOSIS — E785 Hyperlipidemia, unspecified: Secondary | ICD-10-CM | POA: Diagnosis not present

## 2016-02-18 DIAGNOSIS — Z48815 Encounter for surgical aftercare following surgery on the digestive system: Secondary | ICD-10-CM | POA: Diagnosis not present

## 2016-02-18 DIAGNOSIS — E43 Unspecified severe protein-calorie malnutrition: Secondary | ICD-10-CM | POA: Diagnosis not present

## 2016-02-18 DIAGNOSIS — Z5181 Encounter for therapeutic drug level monitoring: Secondary | ICD-10-CM | POA: Diagnosis not present

## 2016-02-18 DIAGNOSIS — I4891 Unspecified atrial fibrillation: Secondary | ICD-10-CM | POA: Diagnosis not present

## 2016-02-18 DIAGNOSIS — J449 Chronic obstructive pulmonary disease, unspecified: Secondary | ICD-10-CM | POA: Diagnosis not present

## 2016-02-18 DIAGNOSIS — Z466 Encounter for fitting and adjustment of urinary device: Secondary | ICD-10-CM | POA: Diagnosis not present

## 2016-02-18 NOTE — Patient Outreach (Signed)
[name] was referred to pharmacy for medication assistance related to the cost of his nicotine patches. Per this referral, patient would like help with getting the best price on these and to discuss other medication cost management strategies. Note per EPIC, patient has [facility] Medicare Part D coverage. Left a HIPAA compliant message on the patient's voicemail. If have not heard from patient by 02/20/16, will give him another call at that time.  Harlow Asa, PharmD Clinical Pharmacist [facility] [phone]

## 2016-02-18 NOTE — Patient Outreach (Signed)
[facility]) Care Management  02/18/2016  Steve Gomez Nov 07, 1940 [id number]  Mr. Steve Gomez is a 75 year old male with medical history significant of COPD, hypertension, hypercholesterolemia, and recently diagnosed atrial fibrillation on Xarelto. Mr. Steve Gomez was discharged to home on 01/17/16 after an admission for abdominal pain and new Afib with RVR. SteveSteve Gomez had extensive workup of abdominal pain and was discharged to home on Xarelto for his afib. He presented to the emergency department on 01/25/16 with complaints of dark looking diarrhea. Hemoglobin was noted to be 7.4. WBC was noted be 23.5. GI was consulted and Mr. Steve Gomez was admitted and found to have mesenteric ischemia with chronic IMA occulision and acute on chronic SMA occlusion. He was eventually taken to the OR for aorto-mesenteric bypass 6/7 by vascular surgery.   [facility] Care Management nursing and pharmacy services have been providing support to Steve Gomez for the last 3 weeks. We have address provider follow up and care coordination assistance needs, medication management and patient assistance program needs, home health follow up, DME needs, nutrition, and post surgical care. I was not able to reach Steve Gomez by phone but spoke with our [facility] pharmacist Steve Gomez who did speak with Steve Gomez and reported that all his pharmacy needs had been met.   Plan: I will follow up with Steve Gomez and his wife again next week and will plan a face to face visit with him based on home care visit schedule and his needs.    [facility]  [phone]

## 2016-02-19 DIAGNOSIS — J449 Chronic obstructive pulmonary disease, unspecified: Secondary | ICD-10-CM | POA: Diagnosis not present

## 2016-02-19 DIAGNOSIS — E785 Hyperlipidemia, unspecified: Secondary | ICD-10-CM | POA: Diagnosis not present

## 2016-02-19 DIAGNOSIS — Z7901 Long term (current) use of anticoagulants: Secondary | ICD-10-CM | POA: Diagnosis not present

## 2016-02-19 DIAGNOSIS — Z5181 Encounter for therapeutic drug level monitoring: Secondary | ICD-10-CM | POA: Diagnosis not present

## 2016-02-19 DIAGNOSIS — Z466 Encounter for fitting and adjustment of urinary device: Secondary | ICD-10-CM | POA: Diagnosis not present

## 2016-02-19 DIAGNOSIS — I4891 Unspecified atrial fibrillation: Secondary | ICD-10-CM | POA: Diagnosis not present

## 2016-02-19 DIAGNOSIS — Z9981 Dependence on supplemental oxygen: Secondary | ICD-10-CM | POA: Diagnosis not present

## 2016-02-19 DIAGNOSIS — Z48815 Encounter for surgical aftercare following surgery on the digestive system: Secondary | ICD-10-CM | POA: Diagnosis not present

## 2016-02-19 DIAGNOSIS — E43 Unspecified severe protein-calorie malnutrition: Secondary | ICD-10-CM | POA: Diagnosis not present

## 2016-02-20 ENCOUNTER — Ambulatory Visit: Payer: Commercial Managed Care - HMO | Admitting: Pharmacist

## 2016-02-20 ENCOUNTER — Other Ambulatory Visit: Payer: Self-pay | Admitting: Pharmacist

## 2016-02-20 NOTE — Patient Outreach (Signed)
Steve Gomez was referred to pharmacy for medication assistance related to the cost of his nicotine patches. Per this referral, patient would like help with getting the best price on these and to discuss other medication cost management strategies. Note per EPIC, patient has [facility] Medicare Part D coverage. Called and spoke with patient. HIPAA identifiers verified and verbal consent received. Mr. Diersen gives me permission to speak with his wife regarding his medications and benefits.   Discuss with Mrs. Delgreco the possibility of using the [employer] OTC benefit in order to obtain the patient's nicotine patches. Let Mrs. Lundin know that per the [facility] OTC benefit catalog, the Nicotine patches are $25 for 7 patches. Mrs. Causer reports that she and her husband are familiar with the OTC benefit and that they each receive a $30 benefit every 3 months. Reports that they are currently using this for other OTC items. Further, Mrs. Nivison reports that they are able to get 14 patches for about $25 from [facility]. Mrs. Overby reports that they will continue to get the patches from [facility] for cost savings when they do not have extra OTC benefit money left over.  Mrs. Befort reports that all of the patient's medications are generic and that they use [employer] mail order, making these copays free.  Mrs. Blomberg reports that she and the patient have no further questions for me at this time. Provide Mrs. Dunivan with my phone number. Will close pharmacy episode at this time.  Harlow Asa, PharmD Clinical Pharmacist [facility] [phone]

## 2016-02-23 ENCOUNTER — Telehealth: Payer: Self-pay | Admitting: *Deleted

## 2016-02-23 ENCOUNTER — Ambulatory Visit (INDEPENDENT_AMBULATORY_CARE_PROVIDER_SITE_OTHER): Payer: Commercial Managed Care - HMO | Admitting: *Deleted

## 2016-02-23 DIAGNOSIS — E785 Hyperlipidemia, unspecified: Secondary | ICD-10-CM | POA: Diagnosis not present

## 2016-02-23 DIAGNOSIS — Z9981 Dependence on supplemental oxygen: Secondary | ICD-10-CM | POA: Diagnosis not present

## 2016-02-23 DIAGNOSIS — Z48815 Encounter for surgical aftercare following surgery on the digestive system: Secondary | ICD-10-CM | POA: Diagnosis not present

## 2016-02-23 DIAGNOSIS — Z5181 Encounter for therapeutic drug level monitoring: Secondary | ICD-10-CM | POA: Diagnosis not present

## 2016-02-23 DIAGNOSIS — E43 Unspecified severe protein-calorie malnutrition: Secondary | ICD-10-CM | POA: Diagnosis not present

## 2016-02-23 DIAGNOSIS — I4891 Unspecified atrial fibrillation: Secondary | ICD-10-CM | POA: Diagnosis not present

## 2016-02-23 DIAGNOSIS — Z466 Encounter for fitting and adjustment of urinary device: Secondary | ICD-10-CM | POA: Diagnosis not present

## 2016-02-23 DIAGNOSIS — J449 Chronic obstructive pulmonary disease, unspecified: Secondary | ICD-10-CM | POA: Diagnosis not present

## 2016-02-23 DIAGNOSIS — Z7901 Long term (current) use of anticoagulants: Secondary | ICD-10-CM | POA: Diagnosis not present

## 2016-02-23 LAB — POCT INR: INR: 3.2

## 2016-02-23 NOTE — Telephone Encounter (Signed)
Done.  See coumadin note. 

## 2016-02-23 NOTE — Telephone Encounter (Signed)
Steve Gomez w/ [facility] called [phone]) called w/ INR 3.2

## 2016-02-24 ENCOUNTER — Ambulatory Visit (INDEPENDENT_AMBULATORY_CARE_PROVIDER_SITE_OTHER): Payer: Commercial Managed Care - HMO | Admitting: Vascular Surgery

## 2016-02-24 ENCOUNTER — Encounter: Payer: Self-pay | Admitting: Vascular Surgery

## 2016-02-24 VITALS — BP 119/68 | HR 76 | Temp 97.6°F | Ht 60.0 in | Wt 110.0 lb

## 2016-02-24 DIAGNOSIS — K551 Chronic vascular disorders of intestine: Secondary | ICD-10-CM

## 2016-02-24 NOTE — Progress Notes (Signed)
   Patient name: Steve Gomez MRN: [id number] DOB: 19-May-1941 Sex: male  REASON FOR VISIT: Follow-up recent aortic mesenteric bypass  HPI: Steve Gomez is a 75 y.o. male who was seen in the hospital earlier this month with acute on chronic mesenteric ischemia. He had had extensive weight loss and had an extensive workup to include upper and lower endoscopy showing ulceration. He presented with worsening pain and had the SMA occlusion where he had a high-grade stenosis. He had had a prior aortic great gap graft many years ago in another city for aneurysmal disease. He underwent a redo aorto to spare mesenteric artery bypass by myself on 02/04/2016. He did extremely well in the hospital and was discharged to home. He has continued to do well at home. He is resolving his postoperative abdominal pain. He reports no further postprandial pain. He is eating but is having slow return of his appetite. He has had no further GI bleeding.  Current Outpatient Prescriptions  Medication Sig Dispense Refill  . acetaminophen (TYLENOL) 500 MG tablet Take 500 mg by mouth every 6 (six) hours as needed for mild pain or moderate pain.    Marland Kitchen amiodarone (PACERONE) 200 MG tablet Take 1 tablet (200 mg total) by mouth daily. 30 tablet 0  . famotidine (PEPCID) 20 MG tablet Take 1 tablet (20 mg total) by mouth daily. 30 tablet 0  . Multiple Vitamins-Minerals (MULTIVITAMINS THER. W/MINERALS) TABS tablet Take 1 tablet by mouth daily.    . nicotine (NICODERM CQ - DOSED IN MG/24 HOURS) 14 mg/24hr patch Place 1 patch (14 mg total) onto the skin daily. 28 patch 0  . OXYGEN Inhale 2 L into the lungs at bedtime.    . simvastatin (ZOCOR) 40 MG tablet Take 1 tablet by mouth daily.    . tamsulosin (FLOMAX) 0.4 MG CAPS capsule Take 1 capsule (0.4 mg total) by mouth daily. 30 capsule 0  . warfarin (COUMADIN) 3 MG tablet Take 0.5 tablets (1.5 mg total) by mouth daily at 6 PM. Take 1.5 mg Coumadin Monday,  Wednesday and Friday and then on all other days take 3 mg at bedtime (Tuesday, Thursday, Saturday and Sunday) (Patient taking differently: Take 1.5 mg by mouth daily at 6 PM. Take 1.5 mg Coumadin Monday, Wednesday and Friday and then on all other days take 3 mg at bedtime (Tuesday, Thursday, Saturday and Sunday) or as directed) 90 tablet 0  . amiodarone (PACERONE) 200 MG tablet Take 1 tablet (200 mg total) by mouth 2 (two) times daily. 24 tablet 0   No current facility-administered medications for this visit.       PHYSICAL EXAM: Filed Vitals:   02/24/16 0835  BP: 119/68  Pulse: 76  Temp: 97.6 F (36.4 C)  TempSrc: Oral  Height: 5' (1.524 m)  Weight: 110 lb (49.896 kg)  SpO2: 100%    GENERAL: The patient is a well-nourished male, in no acute distress. The vital signs are documented above. Abdomen soft and nontender. Well-healed midline incision. Does have upper abdominal bruit.  MEDICAL ISSUES: Stable status post urgent aortic mesenteric bypass for acute mesenteric ischemia on top of chronic mesenteric ischemia. He will resume full activity. We'll begin driving another 2 weeks and feels comfortable. We will see him back again in 2 months for continued follow-up  [name], MD [facility] Tel [phone] Pager [phone]

## 2016-02-25 ENCOUNTER — Other Ambulatory Visit: Payer: Self-pay | Admitting: *Deleted

## 2016-02-25 DIAGNOSIS — J449 Chronic obstructive pulmonary disease, unspecified: Secondary | ICD-10-CM | POA: Diagnosis not present

## 2016-02-25 DIAGNOSIS — Z466 Encounter for fitting and adjustment of urinary device: Secondary | ICD-10-CM | POA: Diagnosis not present

## 2016-02-25 DIAGNOSIS — Z5181 Encounter for therapeutic drug level monitoring: Secondary | ICD-10-CM | POA: Diagnosis not present

## 2016-02-25 DIAGNOSIS — E785 Hyperlipidemia, unspecified: Secondary | ICD-10-CM | POA: Diagnosis not present

## 2016-02-25 DIAGNOSIS — E43 Unspecified severe protein-calorie malnutrition: Secondary | ICD-10-CM | POA: Diagnosis not present

## 2016-02-25 DIAGNOSIS — Z48815 Encounter for surgical aftercare following surgery on the digestive system: Secondary | ICD-10-CM | POA: Diagnosis not present

## 2016-02-25 DIAGNOSIS — I4891 Unspecified atrial fibrillation: Secondary | ICD-10-CM | POA: Diagnosis not present

## 2016-02-25 DIAGNOSIS — Z7901 Long term (current) use of anticoagulants: Secondary | ICD-10-CM | POA: Diagnosis not present

## 2016-02-25 DIAGNOSIS — Z9981 Dependence on supplemental oxygen: Secondary | ICD-10-CM | POA: Diagnosis not present

## 2016-02-25 NOTE — Patient Outreach (Signed)
[facility]) Care Management  02/25/2016  Steve Gomez 1940-09-20 [id number]   Mr. Steve Gomez is a 75 year old male with medical history significant of COPD, hypertension, hypercholesterolemia, and recently diagnosed atrial fibrillation on Xarelto. [name] was discharged to home on 01/17/16 after an admission for abdominal pain and new Afib with RVR. [name] had extensive workup of abdominal pain and was discharged to home on Xarelto for his afib. He presented to the emergency department on 01/25/16 with complaints of dark looking diarrhea. Hemoglobin was noted to be 7.4. WBC was noted be 23.5. GI was consulted and [name] was admitted and found to have mesenteric ischemia with chronic IMA occulision and acute on chronic SMA occlusion. He was eventually taken to the OR for aorto-mesenteric bypass 6/7 by vascular surgery.   [facility] Care Management nursing and pharmacy services have been providing support to [name] for the last 3 weeks. We have addressed provider follow up and care coordination assistance needs, medication management and patient assistance program needs, home health follow up, DME needs, nutrition, and post surgical care.  I spoke with Mrs. Eroh today on her cell phone after I was not able to reach [name] at home. She was "over the moon" about how well [name] has progressed in the last few weeks. She told me that he saw his cardiologist this week and Dr. Donnetta Hutching last week and both physicians gave [name] an excellent report. He has not smoked since leaving the hospital and is doing well on the nicotine patch. [name] has been working with PT and Mrs. Quebedeaux says he is gaining strength and independence and she thinks this makes him feel "like a survivor". Mrs. Bozza says she has spoken with our pharmacist Harlow Asa who followed up with [name] to make sure medication costs were affordable and [name] has no copay for his medicaitons  as long as they are generic.   I offered to set an appointment to see [name] at home and Mrs. Shoaf declined on his behalf. I asked if I may follow up by phone again once more next week and she welcomed the call.   Plan: I will call [name] next week and will either transition him to telephonic case management or health coaching if he wishes for continued follow up. I assured that Mrs. Spaid has my contact information should any needs arise between now and my next call.    [place] [facility]  [phone]

## 2016-02-26 ENCOUNTER — Encounter: Payer: Self-pay | Admitting: Adult Health

## 2016-02-26 ENCOUNTER — Ambulatory Visit (INDEPENDENT_AMBULATORY_CARE_PROVIDER_SITE_OTHER): Payer: Commercial Managed Care - HMO | Admitting: Adult Health

## 2016-02-26 VITALS — BP 104/60 | HR 79 | Ht 63.0 in | Wt 111.0 lb

## 2016-02-26 DIAGNOSIS — Z7901 Long term (current) use of anticoagulants: Secondary | ICD-10-CM | POA: Diagnosis not present

## 2016-02-26 DIAGNOSIS — I482 Chronic atrial fibrillation, unspecified: Secondary | ICD-10-CM

## 2016-02-26 DIAGNOSIS — J449 Chronic obstructive pulmonary disease, unspecified: Secondary | ICD-10-CM | POA: Diagnosis not present

## 2016-02-26 DIAGNOSIS — Z466 Encounter for fitting and adjustment of urinary device: Secondary | ICD-10-CM | POA: Diagnosis not present

## 2016-02-26 DIAGNOSIS — E43 Unspecified severe protein-calorie malnutrition: Secondary | ICD-10-CM | POA: Diagnosis not present

## 2016-02-26 DIAGNOSIS — E785 Hyperlipidemia, unspecified: Secondary | ICD-10-CM | POA: Diagnosis not present

## 2016-02-26 DIAGNOSIS — Z5181 Encounter for therapeutic drug level monitoring: Secondary | ICD-10-CM | POA: Diagnosis not present

## 2016-02-26 DIAGNOSIS — I10 Essential (primary) hypertension: Secondary | ICD-10-CM | POA: Diagnosis not present

## 2016-02-26 DIAGNOSIS — I739 Peripheral vascular disease, unspecified: Secondary | ICD-10-CM | POA: Diagnosis not present

## 2016-02-26 DIAGNOSIS — Z48815 Encounter for surgical aftercare following surgery on the digestive system: Secondary | ICD-10-CM | POA: Diagnosis not present

## 2016-02-26 DIAGNOSIS — I4891 Unspecified atrial fibrillation: Secondary | ICD-10-CM | POA: Diagnosis not present

## 2016-02-26 DIAGNOSIS — Z9981 Dependence on supplemental oxygen: Secondary | ICD-10-CM | POA: Diagnosis not present

## 2016-02-26 MED ORDER — AMIODARONE HCL 200 MG PO TABS: 200.0000 mg | ORAL_TABLET | Freq: Every day | ORAL | Status: DC

## 2016-02-26 NOTE — Progress Notes (Signed)
Cardiology Office Note   Date:  02/26/2016   ID:  Steve Gomez, DOB Jun 02, 1941, MRN [id number]  PCP:  Alonza Bogus, MD  Cardiologist:  Lamar Sprinkles, NP   No chief complaint on file.     History of Present Illness: Steve Gomez is a 75 y.o. male who presents for ongoing assessment and management of atrial fib , mesenteric stenosis with bypass, s/p AAA, hypertension, and hypercholesterolemia. He was last seen by Dr. Johnsie Cancel on 02/12/2016 and was stable but had overall poor functional status. He was asked to stay hydrated. He is followed by Dr. Donnetta Hutching for aortic mesenteric bypass and was seen on 02/24/2016.   Echocardiogram 01/14/2016  Left ventricle: The cavity size was normal. Wall thickness was  normal. Systolic function was normal. The estimated ejection  fraction was in the range of 55% to 60%. Wall motion was normal;  there were no regional wall motion abnormalities. - Mitral valve: Calcified annulus. There was moderate  regurgitation. - Tricuspid valve: There was moderate regurgitation.  He is here today without complaints. He is undergoing PT, eating better, and has not had any chest pain or palpitations. He states that home health is managing his coumadin dosing and labs. His energy level is improving slowly.   Past Medical History  Diagnosis Date  . COPD (chronic obstructive pulmonary disease) ([place])   . Hypertension   . High cholesterol   . S/P AAA repair   . Renal calculi 01/14/2016  . New onset atrial fibrillation ([place])   . Bladder outlet obstruction 01/14/2016  . Atherosclerosis   . Tobacco abuse     Past Surgical History  Procedure Laterality Date  . Cholecystectomy    . Abdominal aortic aneurysm repair    . Esophagogastroduodenoscopy N/A 01/27/2016    Procedure: ESOPHAGOGASTRODUODENOSCOPY (EGD);  Surgeon: [name], MD;  Location: [facility];  Service: Endoscopy;  Laterality: N/A;  . Colonoscopy N/A 01/27/2016    Procedure: COLONOSCOPY;   Surgeon: [name], MD;  Location: [facility];  Service: Endoscopy;  Laterality: N/A;  . Mesenteric artery bypass N/A 02/04/2016    Procedure: AORTO-SUPERIOR MESENTERIC ARTERY BYPASS GRAFT USING 6MM X 30 CM HEMASHIELD GOLD GRAFT;  Surgeon: [name], MD;  Location[facility];  Service: Vascular;  Laterality: N/A;     Current Outpatient Prescriptions  Medication Sig Dispense Refill  . acetaminophen (TYLENOL) 500 MG tablet Take 500 mg by mouth every 6 (six) hours as needed for mild pain or moderate pain.    Marland Kitchen amiodarone (PACERONE) 200 MG tablet Take 1 tablet (200 mg total) by mouth daily. 90 tablet 3  . famotidine (PEPCID) 20 MG tablet Take 1 tablet (20 mg total) by mouth daily. 30 tablet 0  . Multiple Vitamins-Minerals (MULTIVITAMINS THER. W/MINERALS) TABS tablet Take 1 tablet by mouth daily.    . nicotine (NICODERM CQ - DOSED IN MG/24 HOURS) 14 mg/24hr patch Place 1 patch (14 mg total) onto the skin daily. 28 patch 0  . OXYGEN Inhale 2 L into the lungs at bedtime.    . simvastatin (ZOCOR) 40 MG tablet Take 1 tablet by mouth daily.    . tamsulosin (FLOMAX) 0.4 MG CAPS capsule Take 1 capsule (0.4 mg total) by mouth daily. 30 capsule 0  . warfarin (COUMADIN) 3 MG tablet Take 0.5 tablets (1.5 mg total) by mouth daily at 6 PM. Take 1.5 mg Coumadin Monday, Wednesday and Friday and then on all other days take 3 mg at bedtime (Tuesday,  Thursday, Saturday and Sunday) (Patient taking differently: Take 1.5 mg by mouth daily at 6 PM. Take 1.5 mg Coumadin Monday, Wednesday and Friday and then on all other days take 3 mg at bedtime (Tuesday, Thursday, Saturday and Sunday) or as directed) 90 tablet 0  . amiodarone (PACERONE) 200 MG tablet Take 1 tablet (200 mg total) by mouth 2 (two) times daily. 24 tablet 0   No current facility-administered medications for this visit.    Allergies:   Review of patient's allergies indicates no known allergies.    Social History:  The patient  reports that he quit  smoking about 2 weeks ago. His smoking use included Cigarettes. He started smoking about 55 years ago. He smoked 1.00 pack per day. He does not have any smokeless tobacco history on file. He reports that he drinks alcohol. He reports that he does not use illicit drugs.   Family History:  The patient's family history includes Diabetes in his brother, brother, and brother; Suicidality in his father.    ROS: All other systems are reviewed and negative. Unless otherwise mentioned in H&P    PHYSICAL EXAM: VS:  BP 104/60 mmHg  Pulse 79  Ht 5\' 3"  (1.6 m)  Wt 111 lb (50.349 kg)  BMI 19.67 kg/m2  SpO2 97% , BMI Body mass index is 19.67 kg/(m^2). GEN: Well nourished, well developed, in no acute distress HEENT: normal Neck: no JVD, carotid bruits, or masses Cardiac: RRR; no murmurs, rubs, or gallops,no edema  Respiratory:  clear to auscultation bilaterally, normal work of breathing GI: soft, nontender, nondistended, + BS MS: no deformity or atrophy Skin: warm and dry, no rash Neuro:  Strength and sensation are intact Psych: euthymic mood, full affect   Recent Labs: 01/14/2016: TSH 1.248 02/05/2016: ALT 12* 02/09/2016: BUN <5*; Creatinine, Ser 0.59*; Magnesium 1.5*; Potassium 4.2; Sodium 137 02/12/2016: Hemoglobin 8.7*; Platelets 249    Lipid Panel    Component Value Date/Time   CHOL 77 01/15/2016 0223   TRIG 107 01/15/2016 0223   HDL 20* 01/15/2016 0223   CHOLHDL 3.9 01/15/2016 0223   VLDL 21 01/15/2016 0223   LDLCALC 36 01/15/2016 0223      Wt Readings from Last 3 Encounters:  02/26/16 111 lb (50.349 kg)  02/24/16 110 lb (49.896 kg)  02/12/16 103 lb (46.72 kg)     ASSESSMENT AND PLAN:  1. Atrial fibrillation: Heart rate is well controlled on amiodarone. He denies chest pain or rapid HR. He is undergoing PT and has not had episodes of racing HR with increased activity. Coumadin is being monitored by HHN. No changes in his regimen. He will get refills on amiodarone.   2.  Mesenteric Ischemia: Recent bypass by Dr. Donnetta Hutching. He is continuing to follow him post operatively.   3. Hypertension: Currently well controlled. No changes in medications.    Current medicines are reviewed at length with the patient today.    Labs/ tests ordered today include:  No orders of the defined types were placed in this encounter.     Disposition:   FU with 6 months    Signed, Jory Sims, NP  02/26/2016 2:34 PM    [facility] [address], [place], [place] [address] Phone: [phone]; Fax: [phone]

## 2016-02-26 NOTE — Patient Instructions (Signed)

## 2016-02-26 NOTE — Progress Notes (Signed)
Name: Steve Gomez    DOB: 28-Aug-1941  Age: 75 y.o.  MR#: [id number]       PCP:  Alonza Bogus, MD      Insurance: Payor: [employer] MEDICARE / Plan: [place] THN/NTSP / Product Type: *No Product type* /   CC:   No chief complaint on file.   VS Filed Vitals:   02/26/16 1405  Pulse: 79  Height: 5\' 3"  (1.6 m)  Weight: 111 lb (50.349 kg)  SpO2: 97%    Weights Current Weight  02/26/16 111 lb (50.349 kg)  02/24/16 110 lb (49.896 kg)  02/12/16 103 lb (46.72 kg)    Blood Pressure  BP Readings from Last 3 Encounters:  02/24/16 119/68  02/12/16 85/65  02/09/16 102/59     Admit date:  (Not on file) Last encounter with RMR:  Visit date not found   Allergy Review of patient's allergies indicates no known allergies.  Current Outpatient Prescriptions  Medication Sig Dispense Refill  . acetaminophen (TYLENOL) 500 MG tablet Take 500 mg by mouth every 6 (six) hours as needed for mild pain or moderate pain.    Marland Kitchen amiodarone (PACERONE) 200 MG tablet Take 1 tablet (200 mg total) by mouth daily. 30 tablet 0  . famotidine (PEPCID) 20 MG tablet Take 1 tablet (20 mg total) by mouth daily. 30 tablet 0  . Multiple Vitamins-Minerals (MULTIVITAMINS THER. W/MINERALS) TABS tablet Take 1 tablet by mouth daily.    . nicotine (NICODERM CQ - DOSED IN MG/24 HOURS) 14 mg/24hr patch Place 1 patch (14 mg total) onto the skin daily. 28 patch 0  . OXYGEN Inhale 2 L into the lungs at bedtime.    . simvastatin (ZOCOR) 40 MG tablet Take 1 tablet by mouth daily.    . tamsulosin (FLOMAX) 0.4 MG CAPS capsule Take 1 capsule (0.4 mg total) by mouth daily. 30 capsule 0  . warfarin (COUMADIN) 3 MG tablet Take 0.5 tablets (1.5 mg total) by mouth daily at 6 PM. Take 1.5 mg Coumadin Monday, Wednesday and Friday and then on all other days take 3 mg at bedtime (Tuesday, Thursday, Saturday and Sunday) (Patient taking differently: Take 1.5 mg by mouth daily at 6 PM. Take 1.5 mg Coumadin Monday, Wednesday and Friday  and then on all other days take 3 mg at bedtime (Tuesday, Thursday, Saturday and Sunday) or as directed) 90 tablet 0  . amiodarone (PACERONE) 200 MG tablet Take 1 tablet (200 mg total) by mouth 2 (two) times daily. 24 tablet 0   No current facility-administered medications for this visit.    Discontinued Meds:   There are no discontinued medications.  Patient Active Problem List   Diagnosis Date Noted  . Encounter for therapeutic drug monitoring 02/10/2016  . Pressure ulcer 02/02/2016  . Mesenteric ischemia ([place])   . Diarrhea   . Ischemic disease of gut ([place])   . Protein-calorie malnutrition, severe 01/26/2016  . Acute blood loss anemia 01/25/2016  . Rectal bleeding 01/25/2016  . New onset atrial fibrillation ([place])   . Renal calculi 01/14/2016  . Bladder outlet obstruction 01/14/2016  . Atrial fibrillation with RVR ([place]) 01/12/2016  . Ischemia of small intestine ([place]) 01/12/2016  . COPD (chronic obstructive pulmonary disease) ([place]) 01/12/2016  . Hypertension 01/12/2016  . High cholesterol 01/12/2016  . Hypokalemia 01/12/2016  . Tobacco abuse   . Abdominal pain 01/11/2016    LABS    Component Value Date/Time   NA 137 02/09/2016 0350   NA 135  02/08/2016 0308   NA 136 02/07/2016 0350   K 4.2 02/09/2016 0350   K 3.9 02/08/2016 0308   K 4.6 02/07/2016 0350   CL 101 02/09/2016 0350   CL 103 02/08/2016 0308   CL 102 02/07/2016 0350   CO2 31 02/09/2016 0350   CO2 27 02/08/2016 0308   CO2 31 02/07/2016 0350   GLUCOSE 117* 02/09/2016 0350   GLUCOSE 114* 02/08/2016 0308   GLUCOSE 97 02/07/2016 0350   BUN <5* 02/09/2016 0350   BUN <5* 02/08/2016 0308   BUN <5* 02/07/2016 0350   CREATININE 0.59* 02/09/2016 0350   CREATININE 0.54* 02/08/2016 0308   CREATININE 0.57* 02/07/2016 0350   CALCIUM 8.4* 02/09/2016 0350   CALCIUM 7.9* 02/08/2016 0308   CALCIUM 8.1* 02/07/2016 0350   GFRNONAA >60 02/09/2016 0350   GFRNONAA >60 02/08/2016 0308   GFRNONAA >60 02/07/2016 0350    GFRAA >60 02/09/2016 0350   GFRAA >60 02/08/2016 0308   GFRAA >60 02/07/2016 0350   CMP     Component Value Date/Time   NA 137 02/09/2016 0350   K 4.2 02/09/2016 0350   CL 101 02/09/2016 0350   CO2 31 02/09/2016 0350   GLUCOSE 117* 02/09/2016 0350   BUN <5* 02/09/2016 0350   CREATININE 0.59* 02/09/2016 0350   CALCIUM 8.4* 02/09/2016 0350   PROT 4.3* 02/05/2016 0422   ALBUMIN 2.1* 02/05/2016 0422   AST 16 02/05/2016 0422   ALT 12* 02/05/2016 0422   ALKPHOS 91 02/05/2016 0422   BILITOT 0.6 02/05/2016 0422   GFRNONAA >60 02/09/2016 0350   GFRAA >60 02/09/2016 0350       Component Value Date/Time   WBC 8.1 02/12/2016 1522   WBC 7.6 02/09/2016 0350   WBC 7.8 02/08/2016 0308   HGB 8.7* 02/12/2016 1522   HGB 8.7* 02/09/2016 0350   HGB 7.8* 02/08/2016 0308   HCT 28.6* 02/12/2016 1522   HCT 29.0* 02/09/2016 0350   HCT 25.9* 02/08/2016 0308   MCV 84.6 02/12/2016 1522   MCV 84.3 02/09/2016 0350   MCV 85.5 02/08/2016 0308    Lipid Panel     Component Value Date/Time   CHOL 77 01/15/2016 0223   TRIG 107 01/15/2016 0223   HDL 20* 01/15/2016 0223   CHOLHDL 3.9 01/15/2016 0223   VLDL 21 01/15/2016 0223   LDLCALC 36 01/15/2016 0223    ABG    Component Value Date/Time   PHART 7.352 02/04/2016 1310   PCO2ART 51.0* 02/04/2016 1310   PO2ART 67.6* 02/04/2016 1310   HCO3 27.7* 02/04/2016 1310   TCO2 29.3 02/04/2016 1310   O2SAT 90.8 02/04/2016 1310     Lab Results  Component Value Date   TSH 1.248 01/14/2016   BNP (last 3 results) No results for input(s): BNP in the last 8760 hours.  ProBNP (last 3 results) No results for input(s): PROBNP in the last 8760 hours.  Cardiac Panel (last 3 results) No results for input(s): CKTOTAL, CKMB, TROPONINI, RELINDX in the last 72 hours.  Iron/TIBC/Ferritin/ %Sat No results found for: IRON, TIBC, FERRITIN, IRONPCTSAT   EKG Orders placed or performed during the hospital encounter of 01/25/16  . ED EKG  . ED EKG  . EKG  12-Lead  . EKG 12-Lead  . EKG 12-Lead  . EKG 12-Lead  . EKG  . EKG 12-Lead  . EKG 12-Lead  . EKG 12-Lead  . EKG 12-Lead     Prior Assessment and Plan Problem List as of 02/26/2016  Cardiovascular and Mediastinum   Atrial fibrillation with RVR (HCC)   Ischemia of small intestine (HCC)   Hypertension   New onset atrial fibrillation (HCC)   Ischemic disease of gut (HCC)   Mesenteric ischemia (HCC)     Respiratory   COPD (chronic obstructive pulmonary disease) (HCC)     Digestive   Rectal bleeding     Musculoskeletal and Integument   Pressure ulcer     Genitourinary   Renal calculi   Bladder outlet obstruction     Other   Abdominal pain   High cholesterol   Hypokalemia   Tobacco abuse   Acute blood loss anemia   Protein-calorie malnutrition, severe   Diarrhea   Encounter for therapeutic drug monitoring       Imaging: Dg Chest Port 1 View  02/05/2016  CLINICAL DATA:  Status post abdominal surgery EXAM: PORTABLE CHEST 1 VIEW COMPARISON:  02/04/2016 FINDINGS: There is a right IJ catheter sheath with tip in the projection of the SVC. Nasogastric tube tip is in the stomach. Normal heart size. Aortic atherosclerosis is noted. Increase in atelectasis in the right base. Again noted is of lucency along the undersurface of the right hemidiaphragm compatible with pneumoperitoneum. IMPRESSION: 1. Right base atelectasis.  Increased from previous exam. 2. Pneumoperitoneum. Electronically Signed   By: Kerby Moors M.D.   On: 02/05/2016 08:21   Dg Chest Port 1 View  02/04/2016  CLINICAL DATA:  Status post aorto mesenteric aneurysm repair, follow-up line placement. History of COPD EXAM: PORTABLE CHEST 1 VIEW COMPARISON:  Chest x-ray dated Jan 13, 2016 FINDINGS: There is chronic elevation of the right hemidiaphragm. There is air under the hemidiaphragm today. There is density in the inferior aspect of the right upper lobe. The left lung is clear. There is no pneumothorax or  significant pleural effusion. The cardiac silhouette is top-normal in size but stable. The pulmonary vascularity is normal. There is an esophagogastric tube present whose tip projects below the inferior margin of the image. A right internal jugular Cordis sheath tip projects over the proximal SVC. There is kinking of this Cordis sheath as it enters the skin. IMPRESSION: 1. Subsegmental atelectasis or early infiltrate inferiorly in the right upper lobe. No pneumothorax. Small amount of pneumoperitoneum visible under the right hemidiaphram. 2. Esophagogastric tube tip projects below the inferior margin of the image but is seen to lie in the mid gastric body on the accompanying portable abdominal exam. Electronically Signed   By: David  Martinique M.D.   On: 02/04/2016 13:58   Dg Abd Portable 1v  02/04/2016  CLINICAL DATA:  Status post abdominal aortic -mesenteric aneurysm repair, NG tube placement. EXAM: PORTABLE ABDOMEN - 1 VIEW COMPARISON:  Chest x-ray of today's date and CT scan of the abdomen of Jan 25 2016 FINDINGS: There is extraluminal gas in the right upper quadrant of the abdomen. This was seen on the accompanying chest x-ray as well. The esophagogastric tube tip lies in the mid gastric body. There is a moderate amount of gas and stool in the colon and rectum. There is a small amount of small bowel gas as well. There is stable severe dextrocurvature centered in the mid lumbar spine. IMPRESSION: Extraluminal gas within the abdominal cavity consistent with the postoperative state. The nasogastric tube is in reasonable position. Electronically Signed   By: David  Martinique M.D.   On: 02/04/2016 14:01   Ct Angio Abd/pel W/ And/or W/o  01/30/2016  ADDENDUM REPORT: 01/30/2016 14:55 ADDENDUM: 5 x  6 mm ureteral stone on the right just above the right iliac artery causing moderate hydronephrosis of the right kidney. Additional small bilateral renal calculi bilaterally. On the CT of 01/25/2016, the stone was in the  proximal right ureter not causing significant obstruction. Electronically Signed   By: Franchot Gallo M.D.   On: 01/30/2016 14:55  01/30/2016  CLINICAL DATA:  75 year old male with generalized abdominal pain and recent GI bleed. Evaluate for mesenteric ischemia. EXAM: CTA ABDOMEN AND PELVIS wITHOUT AND WITH CONTRAST TECHNIQUE: Multidetector CT imaging of the abdomen and pelvis was performed using the standard protocol during bolus administration of intravenous contrast. Multiplanar reconstructed images and MIPs were obtained and reviewed to evaluate the vascular anatomy. CONTRAST:  100 mL Isovue 370 COMPARISON:  Prior CTA abdomen and pelvis 01/25/2016 FINDINGS: VASCULAR Aorta: Surgical changes of prior tube graft repair of reported abdominal aortic aneurysm. There is focal narrowing of the aortic lumen in the region of the proximal anastomosis. The aortic lumen measures only 12 mm at this location in there is extensive peripheral calcification. No evidence of aneurysm or pseudoaneurysm. Celiac: Heavily diseased origin the celiac artery secondary to bulky calcified atherosclerotic plaque. There is either high-grade stenosis or focal occlusion of the origin of the celiac artery. The splenic artery is relatively small in caliber. Conventional hepatic arterial anatomy. SMA: Approximately 1.5 cm occlusion of the origin of the superior mesenteric artery. There is bulky calcified plaque surrounding the origin. Renals: Solitary bilateral renal arteries. Predominately calcified plaque results in at least moderate stenosis of the left renal artery origin. Heterogeneous fibro fatty and calcified plaque results in mild narrowing of the right renal artery origin. No renal artery aneurysm or fibromuscular dysplasia. IMA: The origin has been ligated. The distal branches opacified via retrograde flow. Inflow: Relatively high-grade focal stenoses of the most proximal bilateral iliac arteries secondary to bulky calcified plaque. Mild  and possibly poststenotic aneurysmal dilatation of the iliac arteries measuring up to 1.9 cm on the right and 1.6 cm on the left. The bilateral internal and external iliac arteries remain patent. Proximal Outflow: Bulky calcified atherosclerotic plaque along the posterior wall of the common femoral arteries bilaterally. The visualized proximal profunda and superficial femoral arteries remain patent. Veins: No focal venous abnormality. NON-VASCULAR Lower Chest: Small bilateral layering pleural effusions. There is associated lower lobe atelectasis mild lower lobe bronchial wall thickening. Cardiomegaly. Atherosclerotic calcifications noted along the visualized coronary arteries. No pericardial effusion. Unremarkable distal thoracic esophagus. Abdomen: Unremarkable CT appearance of the pancreas, spleen, and adrenal glands for age. Grossly unremarkable stomach and duodenum. No evidence of obstruction or focal bowel wall thickening. No focal pneumatosis or free air. Normal hepatic contour and morphology. No discrete hepatic lesions. The gallbladder is surgically absent. No intra or extrahepatic biliary ductal dilatation. Mild periportal edema. No evidence of portal venous gas. Diffuse mild mesenteric edema.  Trace free fluid. Pelvis: Foley catheter is noted within the bladder. The bladder wall appears somewhat thickened although evaluation is limited by underdistention. Bones/Soft Tissues: Mild anasarca. No acute fracture or aggressive appearing lytic or blastic osseous lesion. Degenerative dextro convex scoliosis centered at L1-L2. Review of the MIP images confirms the above findings. IMPRESSION: VASCULAR 1. Severe and largely heavily calcified vascular disease with high-grade stenosis versus occlusion of the celiac artery, occlusion of the origin of the superior mesenteric artery and surgical ligation of the inferior mesenteric artery. These findings are highly consistent with a chronic mesenteric ischemia affecting  the entirety of the gastrointestinal tract. Nearly  all flow to the gastrointestinal tract may be via collateral pathways. 2. Surgical changes of prior infrarenal abdominal aortic repair with aortic tube graft extending from the infrarenal aorta to just proximal to the aortic bifurcation. 3. Focal moderate calcified stenosis of the infrarenal abdominal aorta in the region of the proximal tube graft anastomosis. 4. Focal high-grade calcified stenoses of the bilateral common iliac artery origins followed by poststenotic dilatation as described above. 5. Bulky calcified atherosclerotic plaque in both common femoral arteries resulting in mild to moderate stenosis. 6. Primarily fibro fatty plaque results in a mild narrowing of the right renal artery origin. 7. Calcified plaque results in moderate narrowing of the left renal artery origin. NON VASCULAR 1. No evidence of pneumatosis, portal venous air, or free air. 2. No evidence of obstruction or focal bowel wall thickening. 3. Bilateral pleural effusions, periportal edema, anasarca, small volume ascites and mesenteric edema all consistent with volume overload versus CHF or third-spacing. 4. Bilateral lower lobe atelectasis and bronchial wall thickening. 5. Additional ancillary findings as above without significant interval change. Signed, Criselda Peaches, MD [facility] Electronically Signed: By: Jacqulynn Cadet M.D. On: 01/29/2016 11:35

## 2016-02-27 DIAGNOSIS — E785 Hyperlipidemia, unspecified: Secondary | ICD-10-CM | POA: Diagnosis not present

## 2016-02-27 DIAGNOSIS — Z9981 Dependence on supplemental oxygen: Secondary | ICD-10-CM | POA: Diagnosis not present

## 2016-02-27 DIAGNOSIS — J449 Chronic obstructive pulmonary disease, unspecified: Secondary | ICD-10-CM | POA: Diagnosis not present

## 2016-02-27 DIAGNOSIS — G4733 Obstructive sleep apnea (adult) (pediatric): Secondary | ICD-10-CM | POA: Diagnosis not present

## 2016-02-27 DIAGNOSIS — Z7901 Long term (current) use of anticoagulants: Secondary | ICD-10-CM | POA: Diagnosis not present

## 2016-02-27 DIAGNOSIS — E43 Unspecified severe protein-calorie malnutrition: Secondary | ICD-10-CM | POA: Diagnosis not present

## 2016-02-27 DIAGNOSIS — I4891 Unspecified atrial fibrillation: Secondary | ICD-10-CM | POA: Diagnosis not present

## 2016-02-27 DIAGNOSIS — Z466 Encounter for fitting and adjustment of urinary device: Secondary | ICD-10-CM | POA: Diagnosis not present

## 2016-02-27 DIAGNOSIS — Z48815 Encounter for surgical aftercare following surgery on the digestive system: Secondary | ICD-10-CM | POA: Diagnosis not present

## 2016-02-27 DIAGNOSIS — Z5181 Encounter for therapeutic drug level monitoring: Secondary | ICD-10-CM | POA: Diagnosis not present

## 2016-03-01 ENCOUNTER — Telehealth: Payer: Self-pay | Admitting: *Deleted

## 2016-03-01 ENCOUNTER — Ambulatory Visit (INDEPENDENT_AMBULATORY_CARE_PROVIDER_SITE_OTHER): Payer: Commercial Managed Care - HMO | Admitting: *Deleted

## 2016-03-01 DIAGNOSIS — Z466 Encounter for fitting and adjustment of urinary device: Secondary | ICD-10-CM | POA: Diagnosis not present

## 2016-03-01 DIAGNOSIS — I4891 Unspecified atrial fibrillation: Secondary | ICD-10-CM | POA: Diagnosis not present

## 2016-03-01 DIAGNOSIS — Z7901 Long term (current) use of anticoagulants: Secondary | ICD-10-CM | POA: Diagnosis not present

## 2016-03-01 DIAGNOSIS — J449 Chronic obstructive pulmonary disease, unspecified: Secondary | ICD-10-CM | POA: Diagnosis not present

## 2016-03-01 DIAGNOSIS — Z9981 Dependence on supplemental oxygen: Secondary | ICD-10-CM | POA: Diagnosis not present

## 2016-03-01 DIAGNOSIS — E43 Unspecified severe protein-calorie malnutrition: Secondary | ICD-10-CM | POA: Diagnosis not present

## 2016-03-01 DIAGNOSIS — Z5181 Encounter for therapeutic drug level monitoring: Secondary | ICD-10-CM | POA: Diagnosis not present

## 2016-03-01 DIAGNOSIS — E785 Hyperlipidemia, unspecified: Secondary | ICD-10-CM | POA: Diagnosis not present

## 2016-03-01 DIAGNOSIS — Z48815 Encounter for surgical aftercare following surgery on the digestive system: Secondary | ICD-10-CM | POA: Diagnosis not present

## 2016-03-01 LAB — POCT INR: INR: 3.4

## 2016-03-01 NOTE — Telephone Encounter (Signed)
INR 3.4 / please call with instructions / tg

## 2016-03-01 NOTE — Telephone Encounter (Signed)
Done.  See coumadin note. 

## 2016-03-02 DIAGNOSIS — Z466 Encounter for fitting and adjustment of urinary device: Secondary | ICD-10-CM | POA: Diagnosis not present

## 2016-03-02 DIAGNOSIS — I4891 Unspecified atrial fibrillation: Secondary | ICD-10-CM | POA: Diagnosis not present

## 2016-03-02 DIAGNOSIS — Z7901 Long term (current) use of anticoagulants: Secondary | ICD-10-CM | POA: Diagnosis not present

## 2016-03-02 DIAGNOSIS — Z5181 Encounter for therapeutic drug level monitoring: Secondary | ICD-10-CM | POA: Diagnosis not present

## 2016-03-02 DIAGNOSIS — E43 Unspecified severe protein-calorie malnutrition: Secondary | ICD-10-CM | POA: Diagnosis not present

## 2016-03-02 DIAGNOSIS — J449 Chronic obstructive pulmonary disease, unspecified: Secondary | ICD-10-CM | POA: Diagnosis not present

## 2016-03-02 DIAGNOSIS — Z9981 Dependence on supplemental oxygen: Secondary | ICD-10-CM | POA: Diagnosis not present

## 2016-03-02 DIAGNOSIS — E785 Hyperlipidemia, unspecified: Secondary | ICD-10-CM | POA: Diagnosis not present

## 2016-03-02 DIAGNOSIS — Z48815 Encounter for surgical aftercare following surgery on the digestive system: Secondary | ICD-10-CM | POA: Diagnosis not present

## 2016-03-03 DIAGNOSIS — I4891 Unspecified atrial fibrillation: Secondary | ICD-10-CM | POA: Diagnosis not present

## 2016-03-03 DIAGNOSIS — Z5181 Encounter for therapeutic drug level monitoring: Secondary | ICD-10-CM | POA: Diagnosis not present

## 2016-03-03 DIAGNOSIS — Z9981 Dependence on supplemental oxygen: Secondary | ICD-10-CM | POA: Diagnosis not present

## 2016-03-03 DIAGNOSIS — Z7901 Long term (current) use of anticoagulants: Secondary | ICD-10-CM | POA: Diagnosis not present

## 2016-03-03 DIAGNOSIS — Z48815 Encounter for surgical aftercare following surgery on the digestive system: Secondary | ICD-10-CM | POA: Diagnosis not present

## 2016-03-03 DIAGNOSIS — J449 Chronic obstructive pulmonary disease, unspecified: Secondary | ICD-10-CM | POA: Diagnosis not present

## 2016-03-03 DIAGNOSIS — E785 Hyperlipidemia, unspecified: Secondary | ICD-10-CM | POA: Diagnosis not present

## 2016-03-03 DIAGNOSIS — Z466 Encounter for fitting and adjustment of urinary device: Secondary | ICD-10-CM | POA: Diagnosis not present

## 2016-03-03 DIAGNOSIS — E43 Unspecified severe protein-calorie malnutrition: Secondary | ICD-10-CM | POA: Diagnosis not present

## 2016-03-04 ENCOUNTER — Other Ambulatory Visit: Payer: Self-pay

## 2016-03-04 MED ORDER — AMIODARONE HCL 200 MG PO TABS: 200.0000 mg | ORAL_TABLET | Freq: Every day | ORAL | Status: DC

## 2016-03-05 ENCOUNTER — Other Ambulatory Visit: Payer: Self-pay

## 2016-03-05 MED ORDER — AMIODARONE HCL 200 MG PO TABS: 200.0000 mg | ORAL_TABLET | Freq: Every day | ORAL | Status: DC

## 2016-03-08 ENCOUNTER — Telehealth: Payer: Self-pay | Admitting: *Deleted

## 2016-03-08 ENCOUNTER — Ambulatory Visit (INDEPENDENT_AMBULATORY_CARE_PROVIDER_SITE_OTHER): Payer: Commercial Managed Care - HMO

## 2016-03-08 DIAGNOSIS — E43 Unspecified severe protein-calorie malnutrition: Secondary | ICD-10-CM | POA: Diagnosis not present

## 2016-03-08 DIAGNOSIS — I4891 Unspecified atrial fibrillation: Secondary | ICD-10-CM

## 2016-03-08 DIAGNOSIS — Z7901 Long term (current) use of anticoagulants: Secondary | ICD-10-CM | POA: Diagnosis not present

## 2016-03-08 DIAGNOSIS — Z48815 Encounter for surgical aftercare following surgery on the digestive system: Secondary | ICD-10-CM | POA: Diagnosis not present

## 2016-03-08 DIAGNOSIS — Z5181 Encounter for therapeutic drug level monitoring: Secondary | ICD-10-CM

## 2016-03-08 DIAGNOSIS — Z9981 Dependence on supplemental oxygen: Secondary | ICD-10-CM | POA: Diagnosis not present

## 2016-03-08 DIAGNOSIS — E785 Hyperlipidemia, unspecified: Secondary | ICD-10-CM | POA: Diagnosis not present

## 2016-03-08 DIAGNOSIS — J449 Chronic obstructive pulmonary disease, unspecified: Secondary | ICD-10-CM | POA: Diagnosis not present

## 2016-03-08 DIAGNOSIS — Z466 Encounter for fitting and adjustment of urinary device: Secondary | ICD-10-CM | POA: Diagnosis not present

## 2016-03-08 LAB — POCT INR: INR: 3.4

## 2016-03-08 NOTE — Telephone Encounter (Signed)
Returned call to [place], [facility] see anticoagulation note in Epic.

## 2016-03-08 NOTE — Telephone Encounter (Signed)
Larene Beach from [place] called in pt's INR 3.4  [phone]

## 2016-03-09 DIAGNOSIS — Z7901 Long term (current) use of anticoagulants: Secondary | ICD-10-CM | POA: Diagnosis not present

## 2016-03-09 DIAGNOSIS — Z5181 Encounter for therapeutic drug level monitoring: Secondary | ICD-10-CM | POA: Diagnosis not present

## 2016-03-09 DIAGNOSIS — Z9981 Dependence on supplemental oxygen: Secondary | ICD-10-CM | POA: Diagnosis not present

## 2016-03-09 DIAGNOSIS — Z48815 Encounter for surgical aftercare following surgery on the digestive system: Secondary | ICD-10-CM | POA: Diagnosis not present

## 2016-03-09 DIAGNOSIS — J449 Chronic obstructive pulmonary disease, unspecified: Secondary | ICD-10-CM | POA: Diagnosis not present

## 2016-03-09 DIAGNOSIS — I4891 Unspecified atrial fibrillation: Secondary | ICD-10-CM | POA: Diagnosis not present

## 2016-03-09 DIAGNOSIS — Z466 Encounter for fitting and adjustment of urinary device: Secondary | ICD-10-CM | POA: Diagnosis not present

## 2016-03-09 DIAGNOSIS — E43 Unspecified severe protein-calorie malnutrition: Secondary | ICD-10-CM | POA: Diagnosis not present

## 2016-03-09 DIAGNOSIS — E785 Hyperlipidemia, unspecified: Secondary | ICD-10-CM | POA: Diagnosis not present

## 2016-03-11 ENCOUNTER — Other Ambulatory Visit: Payer: Self-pay | Admitting: *Deleted

## 2016-03-11 NOTE — Patient Outreach (Signed)
[facility]) Care Management  03/11/2016  Jeffren Maxted 09-02-1940 [id number]  Returned a call to Mr. Wyka and Mrs. Veltre and addressed her question about blood thinners. Mrs. Parkhurst administered Mr. Balz blood thinner which she usually gives him at 6pm, at 9:30pm last evening. She was concerned as to whether she needed to adjust this evening's administration time. I advised that it was okay for her to give the medication at the 6pm time.   Mrs. Bollen says that Mr. Demos energy level is picking up and he has been able to do a few more things around the house over the last several days. He is doing well when he goes out for appointments. They are avoiding going out during the very hot parts of the day to avoid over taxing Mr. Herrera during his recovery.   Mrs. Ammar reports that the home health nurse is still making regular visits and is scheduled to come again on Monday, March 15, 2016. She wishes for me to reach out by phone again next week and hold off on home visits until the home health nurse is no longer visiting.   Plan: I will call Mr. Kaufer again next week to follow up on his progress.    [place] Management  [phone]

## 2016-03-12 ENCOUNTER — Ambulatory Visit: Payer: Self-pay | Admitting: *Deleted

## 2016-03-12 DIAGNOSIS — Z5181 Encounter for therapeutic drug level monitoring: Secondary | ICD-10-CM | POA: Diagnosis not present

## 2016-03-12 DIAGNOSIS — Z466 Encounter for fitting and adjustment of urinary device: Secondary | ICD-10-CM | POA: Diagnosis not present

## 2016-03-12 DIAGNOSIS — E785 Hyperlipidemia, unspecified: Secondary | ICD-10-CM | POA: Diagnosis not present

## 2016-03-12 DIAGNOSIS — J449 Chronic obstructive pulmonary disease, unspecified: Secondary | ICD-10-CM | POA: Diagnosis not present

## 2016-03-12 DIAGNOSIS — Z7901 Long term (current) use of anticoagulants: Secondary | ICD-10-CM | POA: Diagnosis not present

## 2016-03-12 DIAGNOSIS — E43 Unspecified severe protein-calorie malnutrition: Secondary | ICD-10-CM | POA: Diagnosis not present

## 2016-03-12 DIAGNOSIS — Z48815 Encounter for surgical aftercare following surgery on the digestive system: Secondary | ICD-10-CM | POA: Diagnosis not present

## 2016-03-12 DIAGNOSIS — Z9981 Dependence on supplemental oxygen: Secondary | ICD-10-CM | POA: Diagnosis not present

## 2016-03-12 DIAGNOSIS — I4891 Unspecified atrial fibrillation: Secondary | ICD-10-CM | POA: Diagnosis not present

## 2016-03-15 DIAGNOSIS — I4891 Unspecified atrial fibrillation: Secondary | ICD-10-CM | POA: Diagnosis not present

## 2016-03-15 DIAGNOSIS — Z9981 Dependence on supplemental oxygen: Secondary | ICD-10-CM | POA: Diagnosis not present

## 2016-03-15 DIAGNOSIS — Z466 Encounter for fitting and adjustment of urinary device: Secondary | ICD-10-CM | POA: Diagnosis not present

## 2016-03-15 DIAGNOSIS — Z5181 Encounter for therapeutic drug level monitoring: Secondary | ICD-10-CM | POA: Diagnosis not present

## 2016-03-15 DIAGNOSIS — E43 Unspecified severe protein-calorie malnutrition: Secondary | ICD-10-CM | POA: Diagnosis not present

## 2016-03-15 DIAGNOSIS — Z48815 Encounter for surgical aftercare following surgery on the digestive system: Secondary | ICD-10-CM | POA: Diagnosis not present

## 2016-03-15 DIAGNOSIS — E785 Hyperlipidemia, unspecified: Secondary | ICD-10-CM | POA: Diagnosis not present

## 2016-03-15 DIAGNOSIS — J449 Chronic obstructive pulmonary disease, unspecified: Secondary | ICD-10-CM | POA: Diagnosis not present

## 2016-03-15 DIAGNOSIS — Z7901 Long term (current) use of anticoagulants: Secondary | ICD-10-CM | POA: Diagnosis not present

## 2016-03-16 ENCOUNTER — Other Ambulatory Visit: Payer: Self-pay | Admitting: *Deleted

## 2016-03-16 NOTE — Patient Outreach (Signed)
[facility]) Care Management  03/16/2016  Deaton Bynes 01-Jul-1941 [id number]   Unable to reach [name] by phone today. HIPPA compliant voice message left.   Plan: I will reach out to [name] again no later than Friday of this week.    [place] Management  [phone]

## 2016-03-17 DIAGNOSIS — J449 Chronic obstructive pulmonary disease, unspecified: Secondary | ICD-10-CM | POA: Diagnosis not present

## 2016-03-17 DIAGNOSIS — E785 Hyperlipidemia, unspecified: Secondary | ICD-10-CM | POA: Diagnosis not present

## 2016-03-17 DIAGNOSIS — Z5181 Encounter for therapeutic drug level monitoring: Secondary | ICD-10-CM | POA: Diagnosis not present

## 2016-03-17 DIAGNOSIS — Z9981 Dependence on supplemental oxygen: Secondary | ICD-10-CM | POA: Diagnosis not present

## 2016-03-17 DIAGNOSIS — I4891 Unspecified atrial fibrillation: Secondary | ICD-10-CM | POA: Diagnosis not present

## 2016-03-17 DIAGNOSIS — Z7901 Long term (current) use of anticoagulants: Secondary | ICD-10-CM | POA: Diagnosis not present

## 2016-03-17 DIAGNOSIS — Z466 Encounter for fitting and adjustment of urinary device: Secondary | ICD-10-CM | POA: Diagnosis not present

## 2016-03-17 DIAGNOSIS — Z48815 Encounter for surgical aftercare following surgery on the digestive system: Secondary | ICD-10-CM | POA: Diagnosis not present

## 2016-03-17 DIAGNOSIS — E43 Unspecified severe protein-calorie malnutrition: Secondary | ICD-10-CM | POA: Diagnosis not present

## 2016-03-18 ENCOUNTER — Other Ambulatory Visit: Payer: Self-pay | Admitting: *Deleted

## 2016-03-18 ENCOUNTER — Ambulatory Visit: Payer: Self-pay | Admitting: *Deleted

## 2016-03-18 DIAGNOSIS — G4733 Obstructive sleep apnea (adult) (pediatric): Secondary | ICD-10-CM | POA: Diagnosis not present

## 2016-03-18 NOTE — Patient Outreach (Signed)
[facility]) Care Management  03/18/2016  Steve Gomez [birth year] [id number]   Mr. Steve Gomez is a 75 year old male with medical history significant of COPD, hypertension, hypercholesterolemia, and recently diagnosed atrial fibrillation on Xarelto. Mr. Steve Gomez was discharged to home from the hospital on 01/17/16 after an admission for abdominal pain and new Afib with RVR. SteveSteve Gomez had extensive workup of abdominal pain and was discharged on Xarelto for his afib. He presented to the emergency department on 01/25/16 with complaints of dark looking diarrhea. Hemoglobin was noted to be 7.4. WBC was noted be 23.5. 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 He was eventually taken to the OR for aorto-mesenteric bypass 6/7 by vascular surgery and discharged to home on 02/09/16.   [facility] Care Management nursing and pharmacy services have been providing support to Mr. Steve Gomez since his hospital discharge. We have addressed provider follow up and care coordination assistance needs, medication management and patient assistance program needs, home health follow up, DME needs, nutrition, and post surgical care.  I reached out to and spoke with Mrs. Chisum today. She had several excellent questions re: Mr. Steve Gomez diet and how leafy green vegetables might interfere with his anticoagulation regimen. She stated Steve Gomez has added kale and spinach ("a few leaves of each one") to his "toss salad". Mrs. Steve Gomez said she started adding these ingredients for overall health benefits but remembered it might affect Mr. Steve Gomez INR so didn't add any more until she discussed it. Her plan is to add the kale and spinach to his salad on Mondays only so she can keep track of how much and how often he is eating leafy greens. We discussed at length that it is important to maintain leafy green intake at a  consistent level, whatever that level is, and that the interference with anticoagulation comes about as a result of sudden increases or decreases in leafy green intake. Mrs. Steve Gomez verbalized understanding.   Mrs. Steve Gomez reports that Mr. Steve Gomez level of energy continues to increase and he is becoming more and more independent. He is still being followed at home by the home health nurse. Mr. Steve Gomez has attended all his scheduled appointments and is next scheduled to see his surgeon, Dr. Sherren Mocha Early on 04/27/16.   Mr. Steve Gomez does not wish to have a face to face visit while home health services are active but agrees to ongoing case management for oversight and support.   Plan: I will continue weekly calls to Mr. Steve Gomez and will plan a face to face visit near the end of his scheduled home health visits.    [place] Management  [phone]

## 2016-03-19 ENCOUNTER — Ambulatory Visit (INDEPENDENT_AMBULATORY_CARE_PROVIDER_SITE_OTHER): Payer: Commercial Managed Care - HMO | Admitting: Cardiology

## 2016-03-19 ENCOUNTER — Telehealth: Payer: Self-pay | Admitting: *Deleted

## 2016-03-19 DIAGNOSIS — E43 Unspecified severe protein-calorie malnutrition: Secondary | ICD-10-CM | POA: Diagnosis not present

## 2016-03-19 DIAGNOSIS — Z466 Encounter for fitting and adjustment of urinary device: Secondary | ICD-10-CM | POA: Diagnosis not present

## 2016-03-19 DIAGNOSIS — Z5181 Encounter for therapeutic drug level monitoring: Secondary | ICD-10-CM

## 2016-03-19 DIAGNOSIS — Z9981 Dependence on supplemental oxygen: Secondary | ICD-10-CM | POA: Diagnosis not present

## 2016-03-19 DIAGNOSIS — E785 Hyperlipidemia, unspecified: Secondary | ICD-10-CM | POA: Diagnosis not present

## 2016-03-19 DIAGNOSIS — I4891 Unspecified atrial fibrillation: Secondary | ICD-10-CM

## 2016-03-19 DIAGNOSIS — J449 Chronic obstructive pulmonary disease, unspecified: Secondary | ICD-10-CM | POA: Diagnosis not present

## 2016-03-19 DIAGNOSIS — Z48815 Encounter for surgical aftercare following surgery on the digestive system: Secondary | ICD-10-CM | POA: Diagnosis not present

## 2016-03-19 DIAGNOSIS — Z7901 Long term (current) use of anticoagulants: Secondary | ICD-10-CM | POA: Diagnosis not present

## 2016-03-19 LAB — POCT INR: INR: 2.8

## 2016-03-19 NOTE — Telephone Encounter (Signed)
Steve Gomez w/ [facility] called concerning the pt's INR --she did not leave results on voicemail. She can be reached at [phone]

## 2016-03-19 NOTE — Telephone Encounter (Signed)
Steve Gomez calls back and she will go do INR now since it was missed on Monday.

## 2016-03-23 DIAGNOSIS — I4891 Unspecified atrial fibrillation: Secondary | ICD-10-CM | POA: Diagnosis not present

## 2016-03-23 DIAGNOSIS — K55059 Acute (reversible) ischemia of intestine, part and extent unspecified: Secondary | ICD-10-CM | POA: Diagnosis not present

## 2016-03-23 DIAGNOSIS — J449 Chronic obstructive pulmonary disease, unspecified: Secondary | ICD-10-CM | POA: Diagnosis not present

## 2016-03-23 DIAGNOSIS — I10 Essential (primary) hypertension: Secondary | ICD-10-CM | POA: Diagnosis not present

## 2016-03-24 ENCOUNTER — Telehealth: Payer: Self-pay | Admitting: *Deleted

## 2016-03-24 ENCOUNTER — Ambulatory Visit (INDEPENDENT_AMBULATORY_CARE_PROVIDER_SITE_OTHER): Payer: Commercial Managed Care - HMO | Admitting: *Deleted

## 2016-03-24 DIAGNOSIS — Z48815 Encounter for surgical aftercare following surgery on the digestive system: Secondary | ICD-10-CM | POA: Diagnosis not present

## 2016-03-24 DIAGNOSIS — Z466 Encounter for fitting and adjustment of urinary device: Secondary | ICD-10-CM | POA: Diagnosis not present

## 2016-03-24 DIAGNOSIS — E43 Unspecified severe protein-calorie malnutrition: Secondary | ICD-10-CM | POA: Diagnosis not present

## 2016-03-24 DIAGNOSIS — Z5181 Encounter for therapeutic drug level monitoring: Secondary | ICD-10-CM

## 2016-03-24 DIAGNOSIS — E785 Hyperlipidemia, unspecified: Secondary | ICD-10-CM | POA: Diagnosis not present

## 2016-03-24 DIAGNOSIS — Z9981 Dependence on supplemental oxygen: Secondary | ICD-10-CM | POA: Diagnosis not present

## 2016-03-24 DIAGNOSIS — I4891 Unspecified atrial fibrillation: Secondary | ICD-10-CM

## 2016-03-24 DIAGNOSIS — J449 Chronic obstructive pulmonary disease, unspecified: Secondary | ICD-10-CM | POA: Diagnosis not present

## 2016-03-24 DIAGNOSIS — Z7901 Long term (current) use of anticoagulants: Secondary | ICD-10-CM | POA: Diagnosis not present

## 2016-03-24 LAB — POCT INR: INR: 1.7

## 2016-03-24 NOTE — Telephone Encounter (Signed)
Done.  See coumadin note. 

## 2016-03-25 ENCOUNTER — Other Ambulatory Visit: Payer: Self-pay | Admitting: *Deleted

## 2016-03-25 ENCOUNTER — Encounter: Payer: Self-pay | Admitting: *Deleted

## 2016-03-25 NOTE — Patient Outreach (Signed)
[facility]) Care Management  03/25/2016  Lanney Carrithers 08-15-1941 [id number]  Mr. Sivan Payamps is a 75 year old male with medical history significant of COPD, hypertension, hypercholesterolemia, and recently diagnosed atrial fibrillation on Xarelto. Mr. Beshara was discharged to home from the hospital on 01/17/16 after an admission for abdominal pain and new Afib with RVR. Mr.Diles had extensive workup of abdominal pain and was discharged on Xarelto for his afib. He presented to the emergency department on 01/25/16 with complaints of dark looking diarrhea. Hemoglobin was noted to be 7.4. WBC was noted be 23.5. GI was consulted and Mr. 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 He was eventually taken to the OR for aorto-mesenteric bypass 6/7 by vascular surgery and discharged to home on 02/09/16.   [facility] Care Management nursing and pharmacy services have been providing support to Mr. Edler since his hospital discharge. We have addressed provider follow up and care coordination assistance needs, medication management and patient assistance program needs, home health follow up, DME needs, nutrition, and post surgical care.  I spoke with Mrs. Bova today. She is Mr. Por primary caregiver and has made it a point to educate herself about her husband's health conditions. She is an excellent health advocate and medication manager for Mr. Amado. Today, Mrs. Gutowski and I talked about Mr. Schnick most recent INR which was < 2.0. We discussed Mr. Boleyn diet and long term plans for Coumadin management including where Mr. Paccione would go for INR draws. I reached out to [name] at [facility] to see if Mr. Lathe would be able to get INR draws performed there once his home health agency nurse stops visits AND if Mr. Okereke would incur a copay for each visit.   Plan: I will reach out to Mr.  Rapozo and his wife by phone again next week and will provide follow up information re: INR draws after I speak with Mrs. Joneen Caraway.  Mr. Kubick will continue medications as prescribed and will call for any questions or new/worsened symptoms.    [place] Management  [phone]

## 2016-03-28 DIAGNOSIS — G4733 Obstructive sleep apnea (adult) (pediatric): Secondary | ICD-10-CM | POA: Diagnosis not present

## 2016-03-31 ENCOUNTER — Telehealth: Payer: Self-pay | Admitting: *Deleted

## 2016-03-31 ENCOUNTER — Ambulatory Visit (INDEPENDENT_AMBULATORY_CARE_PROVIDER_SITE_OTHER): Payer: Commercial Managed Care - HMO | Admitting: *Deleted

## 2016-03-31 DIAGNOSIS — J449 Chronic obstructive pulmonary disease, unspecified: Secondary | ICD-10-CM | POA: Diagnosis not present

## 2016-03-31 DIAGNOSIS — I4891 Unspecified atrial fibrillation: Secondary | ICD-10-CM | POA: Diagnosis not present

## 2016-03-31 DIAGNOSIS — Z5181 Encounter for therapeutic drug level monitoring: Secondary | ICD-10-CM

## 2016-03-31 DIAGNOSIS — E785 Hyperlipidemia, unspecified: Secondary | ICD-10-CM | POA: Diagnosis not present

## 2016-03-31 DIAGNOSIS — Z7901 Long term (current) use of anticoagulants: Secondary | ICD-10-CM | POA: Diagnosis not present

## 2016-03-31 DIAGNOSIS — Z9981 Dependence on supplemental oxygen: Secondary | ICD-10-CM | POA: Diagnosis not present

## 2016-03-31 DIAGNOSIS — E43 Unspecified severe protein-calorie malnutrition: Secondary | ICD-10-CM | POA: Diagnosis not present

## 2016-03-31 DIAGNOSIS — Z466 Encounter for fitting and adjustment of urinary device: Secondary | ICD-10-CM | POA: Diagnosis not present

## 2016-03-31 DIAGNOSIS — Z48815 Encounter for surgical aftercare following surgery on the digestive system: Secondary | ICD-10-CM | POA: Diagnosis not present

## 2016-03-31 LAB — POCT INR: INR: 2.4

## 2016-03-31 NOTE — Telephone Encounter (Signed)
Done.  See coumadin note. 

## 2016-04-01 ENCOUNTER — Encounter: Payer: Self-pay | Admitting: *Deleted

## 2016-04-01 ENCOUNTER — Other Ambulatory Visit: Payer: Self-pay | Admitting: *Deleted

## 2016-04-01 NOTE — Patient Outreach (Signed)
[facility]) Care Management  04/01/2016  Steve Gomez [birth year] [id number]  Follow up with Steve Gomez today for transition of care needs and progress regarding INR management. Steve Gomez INR this week was 2.3. The home health nurse is providing instructions on Coumadin dosing as provided by [name] RN - Coumadin Clinic Manager at [facility].    Steve Gomez and I spoke about his home health nursing visit schedule. The last scheduled home health nurse visit is for next week. I will see Steve Gomez at home the following week on 04/15/16 for our initial home visit.   With regard to Steve Gomez general progress, he states his energy level is still not back to 100% but he is able to do "more and more every week". Steve Gomez is taking his medications as prescribed and is adhering to his prescribed cardiac prudent diet. He can verbalize and has had no signs or symptoms of dysrythmia.   Plan: I will see Steve Gomez at home on 04/15/16.    [place] Management  [phone]

## 2016-04-05 ENCOUNTER — Other Ambulatory Visit: Payer: Self-pay | Admitting: *Deleted

## 2016-04-05 DIAGNOSIS — Z5181 Encounter for therapeutic drug level monitoring: Secondary | ICD-10-CM

## 2016-04-05 DIAGNOSIS — I4891 Unspecified atrial fibrillation: Secondary | ICD-10-CM

## 2016-04-07 ENCOUNTER — Telehealth: Payer: Self-pay | Admitting: *Deleted

## 2016-04-07 ENCOUNTER — Ambulatory Visit (INDEPENDENT_AMBULATORY_CARE_PROVIDER_SITE_OTHER): Payer: Commercial Managed Care - HMO | Admitting: *Deleted

## 2016-04-07 DIAGNOSIS — Z48815 Encounter for surgical aftercare following surgery on the digestive system: Secondary | ICD-10-CM | POA: Diagnosis not present

## 2016-04-07 DIAGNOSIS — Z5181 Encounter for therapeutic drug level monitoring: Secondary | ICD-10-CM | POA: Diagnosis not present

## 2016-04-07 DIAGNOSIS — I4891 Unspecified atrial fibrillation: Secondary | ICD-10-CM | POA: Diagnosis not present

## 2016-04-07 DIAGNOSIS — E43 Unspecified severe protein-calorie malnutrition: Secondary | ICD-10-CM | POA: Diagnosis not present

## 2016-04-07 DIAGNOSIS — Z9981 Dependence on supplemental oxygen: Secondary | ICD-10-CM | POA: Diagnosis not present

## 2016-04-07 DIAGNOSIS — J449 Chronic obstructive pulmonary disease, unspecified: Secondary | ICD-10-CM | POA: Diagnosis not present

## 2016-04-07 DIAGNOSIS — E785 Hyperlipidemia, unspecified: Secondary | ICD-10-CM | POA: Diagnosis not present

## 2016-04-07 DIAGNOSIS — Z7901 Long term (current) use of anticoagulants: Secondary | ICD-10-CM | POA: Diagnosis not present

## 2016-04-07 DIAGNOSIS — Z466 Encounter for fitting and adjustment of urinary device: Secondary | ICD-10-CM | POA: Diagnosis not present

## 2016-04-07 LAB — POCT INR: INR: 1.3

## 2016-04-07 NOTE — Telephone Encounter (Signed)
Spoke with Larene Beach RN [facility].  See coumadin note from today.

## 2016-04-10 DIAGNOSIS — J449 Chronic obstructive pulmonary disease, unspecified: Secondary | ICD-10-CM | POA: Diagnosis not present

## 2016-04-10 DIAGNOSIS — I4891 Unspecified atrial fibrillation: Secondary | ICD-10-CM | POA: Diagnosis not present

## 2016-04-10 DIAGNOSIS — E785 Hyperlipidemia, unspecified: Secondary | ICD-10-CM | POA: Diagnosis not present

## 2016-04-10 DIAGNOSIS — E43 Unspecified severe protein-calorie malnutrition: Secondary | ICD-10-CM | POA: Diagnosis not present

## 2016-04-12 ENCOUNTER — Encounter: Payer: Self-pay | Admitting: *Deleted

## 2016-04-12 ENCOUNTER — Other Ambulatory Visit: Payer: Self-pay | Admitting: *Deleted

## 2016-04-12 ENCOUNTER — Ambulatory Visit (INDEPENDENT_AMBULATORY_CARE_PROVIDER_SITE_OTHER): Payer: Commercial Managed Care - HMO | Admitting: *Deleted

## 2016-04-12 ENCOUNTER — Telehealth: Payer: Self-pay | Admitting: *Deleted

## 2016-04-12 DIAGNOSIS — F17211 Nicotine dependence, cigarettes, in remission: Secondary | ICD-10-CM | POA: Diagnosis not present

## 2016-04-12 DIAGNOSIS — E785 Hyperlipidemia, unspecified: Secondary | ICD-10-CM | POA: Diagnosis not present

## 2016-04-12 DIAGNOSIS — Z5181 Encounter for therapeutic drug level monitoring: Secondary | ICD-10-CM | POA: Diagnosis not present

## 2016-04-12 DIAGNOSIS — Z9981 Dependence on supplemental oxygen: Secondary | ICD-10-CM | POA: Diagnosis not present

## 2016-04-12 DIAGNOSIS — J449 Chronic obstructive pulmonary disease, unspecified: Secondary | ICD-10-CM | POA: Diagnosis not present

## 2016-04-12 DIAGNOSIS — I4891 Unspecified atrial fibrillation: Secondary | ICD-10-CM

## 2016-04-12 DIAGNOSIS — E43 Unspecified severe protein-calorie malnutrition: Secondary | ICD-10-CM | POA: Diagnosis not present

## 2016-04-12 DIAGNOSIS — Z7901 Long term (current) use of anticoagulants: Secondary | ICD-10-CM | POA: Diagnosis not present

## 2016-04-12 LAB — POCT INR: INR: 1.9

## 2016-04-12 NOTE — Telephone Encounter (Signed)
[facility] nurse, Larene Beach, called to report patient's PT (22.4) and INR (1.9) to [facility].  Lattie Haw, please call her back at [phone] when you get a chance.

## 2016-04-12 NOTE — Telephone Encounter (Signed)
Done.  See coumadin note. 

## 2016-04-12 NOTE — Patient Outreach (Signed)
[facility] [facility]) Care Management  04/12/2016  Obi Ogaz 08/17/1941 [id number]  Call received today from Mr. Gower and his wife, Mrs. Townes Steffenson who is his primary caregiver and doing an exceptional job of assisting Mr. Berrien with management of his health needs.   [facility] called today to report that home health nursing visits have been extended to 05/03/16. We previously scheduled a face to face visit for this week but Mr. Companion wishes to move our first face to face visit out to the week after his last home health visit. That way he will not have a gap in visits and I can help him/his wife determine any further needs and provide care coordination assistance.   Mr. Friedel reported that he felt well enough this weekend to go out to the flea market and walk around for the better part of 2 hours. He states he is becoming more active and picking back up on household duties and activities around the house.   He continues his Coumadin regimen as prescribed and his home health nurse, Larene Beach is coming for weekly visits and draws his INR.   Mr. & Mrs. Haraldson have done an excellent job of inquiring and making sure they understand the short and long term plan of care for Mr. Watton including medication management, provider appointment management, prescribed exercise and dietary protocols, and long term plan for self health management.   Plan: I will reach out to Mr. & Mrs. Hartner next week to assess continued progress. We will scheduled a face to face visit for the week following 05/03/16, according to what best suits Mr. Pryde needs.    [place] Management  [phone]

## 2016-04-15 ENCOUNTER — Ambulatory Visit: Payer: Self-pay | Admitting: *Deleted

## 2016-04-18 DIAGNOSIS — G4733 Obstructive sleep apnea (adult) (pediatric): Secondary | ICD-10-CM | POA: Diagnosis not present

## 2016-04-19 ENCOUNTER — Encounter: Payer: Self-pay | Admitting: *Deleted

## 2016-04-19 ENCOUNTER — Ambulatory Visit (INDEPENDENT_AMBULATORY_CARE_PROVIDER_SITE_OTHER): Payer: Commercial Managed Care - HMO | Admitting: *Deleted

## 2016-04-19 ENCOUNTER — Other Ambulatory Visit: Payer: Self-pay | Admitting: *Deleted

## 2016-04-19 DIAGNOSIS — E43 Unspecified severe protein-calorie malnutrition: Secondary | ICD-10-CM | POA: Diagnosis not present

## 2016-04-19 DIAGNOSIS — Z5181 Encounter for therapeutic drug level monitoring: Secondary | ICD-10-CM

## 2016-04-19 DIAGNOSIS — Z9981 Dependence on supplemental oxygen: Secondary | ICD-10-CM | POA: Diagnosis not present

## 2016-04-19 DIAGNOSIS — Z7901 Long term (current) use of anticoagulants: Secondary | ICD-10-CM | POA: Diagnosis not present

## 2016-04-19 DIAGNOSIS — E785 Hyperlipidemia, unspecified: Secondary | ICD-10-CM | POA: Diagnosis not present

## 2016-04-19 DIAGNOSIS — I4891 Unspecified atrial fibrillation: Secondary | ICD-10-CM | POA: Diagnosis not present

## 2016-04-19 DIAGNOSIS — J449 Chronic obstructive pulmonary disease, unspecified: Secondary | ICD-10-CM | POA: Diagnosis not present

## 2016-04-19 DIAGNOSIS — F17211 Nicotine dependence, cigarettes, in remission: Secondary | ICD-10-CM | POA: Diagnosis not present

## 2016-04-19 LAB — POCT INR: INR: 1.4

## 2016-04-19 NOTE — Patient Outreach (Signed)
[facility]) Care Management  04/19/2016  Aaric Dodgson March 15, 1941 [id number]   HIPPA compliant voice message left with Mr. & Mrs. Faas today, requesting return call for follow up on Afib management and general progress. Mr. Gesler continues to be followed at home by [facility] RN who is performing CV assessments and venipuncture for PT/INR checks.   Plan: I will return a call to Mr. Schramel by next week if I've not heard from them.    [place] Management  [phone]

## 2016-04-22 ENCOUNTER — Other Ambulatory Visit: Payer: Self-pay | Admitting: *Deleted

## 2016-04-22 NOTE — Patient Outreach (Signed)
[facility]) Care Management  04/22/2016  Javid Denardo 03/16/1941 [id number]   Return call received from Mrs. Nancy Haaland regarding her husband Mr. Demarkus Belgrave. Mrs. Kirchman reports that Mr. Shellhammer's nurse is still coming weekly from [facility]. She says that Mr. Roehm's Coumadin dose was adjusted this week based on his INR results. She reports that he continues to improve in terms of energy and independence with ald's/iadl's. He is keeping provider appointments and adhering to his prescribed plan of care (medications, appointments). Mrs. Cossey says Mr. Pavlicek would like to wait for the completion of his home care visits prior to my scheduling a face to face visit but is grateful for my continued call to ensure that he is progressing and care coordination needs are being met.   Plan: I will follow up with Mr. Chiu by phone over the next 2 weeks.    Alisa Gilboy MHA,BSN,RN,CCM [facility] Care Management  [phone]   

## 2016-04-23 ENCOUNTER — Encounter: Payer: Self-pay | Admitting: Vascular Surgery

## 2016-04-26 ENCOUNTER — Ambulatory Visit (INDEPENDENT_AMBULATORY_CARE_PROVIDER_SITE_OTHER): Payer: Commercial Managed Care - HMO | Admitting: *Deleted

## 2016-04-26 DIAGNOSIS — I4891 Unspecified atrial fibrillation: Secondary | ICD-10-CM | POA: Diagnosis not present

## 2016-04-26 DIAGNOSIS — Z5181 Encounter for therapeutic drug level monitoring: Secondary | ICD-10-CM

## 2016-04-26 DIAGNOSIS — Z7901 Long term (current) use of anticoagulants: Secondary | ICD-10-CM | POA: Diagnosis not present

## 2016-04-26 DIAGNOSIS — F17211 Nicotine dependence, cigarettes, in remission: Secondary | ICD-10-CM | POA: Diagnosis not present

## 2016-04-26 DIAGNOSIS — Z9981 Dependence on supplemental oxygen: Secondary | ICD-10-CM | POA: Diagnosis not present

## 2016-04-26 DIAGNOSIS — J449 Chronic obstructive pulmonary disease, unspecified: Secondary | ICD-10-CM | POA: Diagnosis not present

## 2016-04-26 DIAGNOSIS — E785 Hyperlipidemia, unspecified: Secondary | ICD-10-CM | POA: Diagnosis not present

## 2016-04-26 DIAGNOSIS — E43 Unspecified severe protein-calorie malnutrition: Secondary | ICD-10-CM | POA: Diagnosis not present

## 2016-04-26 LAB — POCT INR: INR: 1.9

## 2016-04-27 ENCOUNTER — Encounter: Payer: Self-pay | Admitting: Vascular Surgery

## 2016-04-27 ENCOUNTER — Ambulatory Visit (INDEPENDENT_AMBULATORY_CARE_PROVIDER_SITE_OTHER): Payer: Commercial Managed Care - HMO | Admitting: Vascular Surgery

## 2016-04-27 ENCOUNTER — Other Ambulatory Visit: Payer: Self-pay | Admitting: *Deleted

## 2016-04-27 VITALS — BP 111/69 | HR 69 | Temp 97.9°F | Resp 16 | Ht 63.0 in | Wt 123.0 lb

## 2016-04-27 DIAGNOSIS — K551 Chronic vascular disorders of intestine: Secondary | ICD-10-CM

## 2016-04-27 NOTE — Progress Notes (Signed)
   Patient name: Steve Gomez MRN: [id number] DOB: 12/11/1940 Sex: male  REASON FOR VISIT: Follow-up aorta mesenteric bypass  HPI: Steve Gomez is a 75 y.o. male here today for continued follow-up of aorta mesenteric bypass June 2017. He had a chronic mesenteric ischemia and presented with acute occlusion of his per mesenteric artery. He underwent urgent bypass from his old aortic graft to his spare mesenteric artery. He has done well since discharge. My last visit with him 2 months ago had the his weight at 110 pounds. Today his weight is 123 pounds. He denies any postprandial pain. He has resolved the soreness he had around the time of his surgery.  Current Outpatient Prescriptions  Medication Sig Dispense Refill  . acetaminophen (TYLENOL) 500 MG tablet Take 500 mg by mouth every 6 (six) hours as needed for mild pain or moderate pain.    Marland Kitchen amiodarone (PACERONE) 200 MG tablet Take 1 tablet (200 mg total) by mouth daily. 90 tablet 3  . chlorthalidone (HYGROTON) 25 MG tablet Take 25 mg by mouth daily.    . famotidine (PEPCID) 20 MG tablet Take 1 tablet (20 mg total) by mouth daily. 30 tablet 0  . Multiple Vitamins-Minerals (MULTIVITAMINS THER. W/MINERALS) TABS tablet Take 1 tablet by mouth daily.    . OXYGEN Inhale 2 L into the lungs at bedtime.    . simvastatin (ZOCOR) 40 MG tablet Take 1 tablet by mouth daily.    . tamsulosin (FLOMAX) 0.4 MG CAPS capsule Take 1 capsule (0.4 mg total) by mouth daily. 30 capsule 0  . warfarin (COUMADIN) 3 MG tablet Take 0.5 tablets (1.5 mg total) by mouth daily at 6 PM. Take 1.5 mg Coumadin Monday, Wednesday and Friday and then on all other days take 3 mg at bedtime (Tuesday, Thursday, Saturday and Sunday) (Patient taking differently: Take 1.5 mg by mouth daily at 6 PM. Take 1.5 mg Coumadin Monday, Wednesday and Friday and then on all other days take 3 mg at bedtime (Tuesday, Thursday, Saturday and Sunday) or as directed) 90  tablet 0  . Multiple Vitamin (MULTIVITAMIN) capsule Take 1 capsule by mouth daily.    . nicotine (NICODERM CQ - DOSED IN MG/24 HOURS) 14 mg/24hr patch Place 1 patch (14 mg total) onto the skin daily. (Patient not taking: Reported on 03/18/2016) 28 patch 0   No current facility-administered medications for this visit.      PHYSICAL EXAM: Vitals:   04/27/16 1333  BP: 111/69  Pulse: 69  Resp: 16  Temp: 97.9 F (36.6 C)  TempSrc: Oral  SpO2: 97%  Weight: 123 lb (55.8 kg)  Height: 5\' 3"  (1.6 m)    GENERAL: The patient is a well-nourished male, in no acute distress. The vital signs are documented above. Abdomen soft and nontender. Normoactive bowel sounds. No bruit. 2+ femoral 2+ popliteal and 2+ posterior tibial pulses bilaterally  MEDICAL ISSUES: Stable status post urgent aorta to mesenteric bypass. Continue usual activities without limitation. We will see him again in June for one-year follow-up. Will obtain ultrasound of his mesenteric bypass at that visit. He will notify should he develop any new postprandial pain or other postoperative discomfort   [name], MD FACS [facility] Tel [phone] Pager [phone]

## 2016-04-28 DIAGNOSIS — G4733 Obstructive sleep apnea (adult) (pediatric): Secondary | ICD-10-CM | POA: Diagnosis not present

## 2016-04-29 ENCOUNTER — Other Ambulatory Visit: Payer: Self-pay | Admitting: Cardiovascular Disease

## 2016-04-29 ENCOUNTER — Other Ambulatory Visit: Payer: Self-pay | Admitting: *Deleted

## 2016-04-29 ENCOUNTER — Encounter: Payer: Self-pay | Admitting: *Deleted

## 2016-04-29 NOTE — Patient Outreach (Signed)
[facility]) Care Management  04/29/2016  Steve Gomez Aug 01, 1941 [id number]  I spoke with Steve Gomez wife, Steve Gomez today. She reports that Steve Gomez is feeling well, taking all medications as prescribed, and attending his provider appointments. His home health nursing visits will continue for 2 more weeks.   Medication Concerns -  Steve Gomez asked about side effects related to Simvastatin and Amlodipine. She said she read that these medications may cause muscle aches. Steve Gomez is not experiencing any of these side effects. However, she is concerned enough about the possibility of side effects that she plans to stop by the cardiology office today when she goes to her own appointment nearby, to discuss this with the cardiology team.    Plan: I will reach out to Steve Gomez by phone and with his permission will schedule a home visit at that time.    [place] Management  [phone]

## 2016-04-30 NOTE — Telephone Encounter (Signed)
Pt's wife was calling to stress concerns about a letter she received from [facility] concerning the cholesterol medication he is on. She stated that Dr. Luan Pulling is the provider that prescribes it, so I advised her to call that office to be sure they are made aware of the letter. Pt's wife stated she will contact their office.

## 2016-05-04 ENCOUNTER — Ambulatory Visit (INDEPENDENT_AMBULATORY_CARE_PROVIDER_SITE_OTHER): Payer: Commercial Managed Care - HMO | Admitting: *Deleted

## 2016-05-04 DIAGNOSIS — Z9981 Dependence on supplemental oxygen: Secondary | ICD-10-CM | POA: Diagnosis not present

## 2016-05-04 DIAGNOSIS — I4891 Unspecified atrial fibrillation: Secondary | ICD-10-CM | POA: Diagnosis not present

## 2016-05-04 DIAGNOSIS — Z7901 Long term (current) use of anticoagulants: Secondary | ICD-10-CM | POA: Diagnosis not present

## 2016-05-04 DIAGNOSIS — F17211 Nicotine dependence, cigarettes, in remission: Secondary | ICD-10-CM | POA: Diagnosis not present

## 2016-05-04 DIAGNOSIS — E785 Hyperlipidemia, unspecified: Secondary | ICD-10-CM | POA: Diagnosis not present

## 2016-05-04 DIAGNOSIS — J449 Chronic obstructive pulmonary disease, unspecified: Secondary | ICD-10-CM | POA: Diagnosis not present

## 2016-05-04 DIAGNOSIS — Z5181 Encounter for therapeutic drug level monitoring: Secondary | ICD-10-CM | POA: Diagnosis not present

## 2016-05-04 DIAGNOSIS — E43 Unspecified severe protein-calorie malnutrition: Secondary | ICD-10-CM | POA: Diagnosis not present

## 2016-05-04 LAB — POCT INR: INR: 2.1

## 2016-05-05 DIAGNOSIS — Z23 Encounter for immunization: Secondary | ICD-10-CM | POA: Diagnosis not present

## 2016-05-05 DIAGNOSIS — I4891 Unspecified atrial fibrillation: Secondary | ICD-10-CM | POA: Diagnosis not present

## 2016-05-05 DIAGNOSIS — I10 Essential (primary) hypertension: Secondary | ICD-10-CM | POA: Diagnosis not present

## 2016-05-05 DIAGNOSIS — G4733 Obstructive sleep apnea (adult) (pediatric): Secondary | ICD-10-CM | POA: Diagnosis not present

## 2016-05-05 DIAGNOSIS — J449 Chronic obstructive pulmonary disease, unspecified: Secondary | ICD-10-CM | POA: Diagnosis not present

## 2016-05-06 ENCOUNTER — Telehealth: Payer: Self-pay | Admitting: Adult Health

## 2016-05-06 MED ORDER — WARFARIN SODIUM 3 MG PO TABS: ORAL_TABLET | ORAL | 3 refills | Status: DC

## 2016-05-06 NOTE — Telephone Encounter (Signed)
Rx sent in

## 2016-05-11 ENCOUNTER — Telehealth: Payer: Self-pay | Admitting: *Deleted

## 2016-05-11 ENCOUNTER — Other Ambulatory Visit: Payer: Self-pay | Admitting: *Deleted

## 2016-05-11 ENCOUNTER — Encounter: Payer: Self-pay | Admitting: *Deleted

## 2016-05-11 ENCOUNTER — Ambulatory Visit (INDEPENDENT_AMBULATORY_CARE_PROVIDER_SITE_OTHER): Payer: Commercial Managed Care - HMO | Admitting: Cardiology

## 2016-05-11 DIAGNOSIS — I4891 Unspecified atrial fibrillation: Secondary | ICD-10-CM

## 2016-05-11 DIAGNOSIS — F17211 Nicotine dependence, cigarettes, in remission: Secondary | ICD-10-CM | POA: Diagnosis not present

## 2016-05-11 DIAGNOSIS — E785 Hyperlipidemia, unspecified: Secondary | ICD-10-CM | POA: Diagnosis not present

## 2016-05-11 DIAGNOSIS — Z5181 Encounter for therapeutic drug level monitoring: Secondary | ICD-10-CM | POA: Diagnosis not present

## 2016-05-11 DIAGNOSIS — Z7901 Long term (current) use of anticoagulants: Secondary | ICD-10-CM | POA: Diagnosis not present

## 2016-05-11 DIAGNOSIS — J449 Chronic obstructive pulmonary disease, unspecified: Secondary | ICD-10-CM | POA: Diagnosis not present

## 2016-05-11 DIAGNOSIS — E43 Unspecified severe protein-calorie malnutrition: Secondary | ICD-10-CM | POA: Diagnosis not present

## 2016-05-11 DIAGNOSIS — Z9981 Dependence on supplemental oxygen: Secondary | ICD-10-CM | POA: Diagnosis not present

## 2016-05-11 LAB — POCT INR: INR: 1.7

## 2016-05-11 NOTE — Telephone Encounter (Signed)
Spoke with Larene Beach, [facility] RN & gave instructions, please refer to Anticoagulation Track for detailed instructions.

## 2016-05-11 NOTE — Patient Outreach (Signed)
[facility]) Care Management  05/11/2016  Steve Gomez [birth year] [id number]   Mr. Steve Gomez is a 75 year old male with medical history significant of COPD, hypertension, hypercholesterolemia, and recently diagnosed atrial fibrillation on Xarelto. Mr. Steve Gomez was discharged to home from the hospital on 01/17/16 after an admission for abdominal pain and new Afib with RVR. SteveSteve Gomez had extensive workup of abdominal pain and was discharged on Xarelto for his afib. He presented to the emergency department on 01/25/16 with complaints of dark looking diarrhea. Hemoglobin was noted to be 7.4. WBC was noted be 23.5. GI was consulted and Mr. Steve Gomez was admitted and found to have mesenteric ischemia with chronic IMA occulision and acute on chronic SMA occlusion. He was eventually taken to the OR for aorto-mesenteric bypass 6/7 by vascular surgery and discharged to home on 02/09/16.   [facility] Care Management nursing and pharmacy services have been providing support to Steve Gomez since his hospital discharge. We have addressed provider follow up and care coordination assistance needs, medication management and patient assistance program needs, home health follow up, DME needs, nutrition, and post surgical care.  I spoke with Steve Gomez who reports that Steve Gomez continues to progress and do well as he is returning to "fairly regular" activity including household activities, outings to church and the grocery store, and walking for exercise. [facility] continues to provide home health services and in particular is performing INR checks and maintaining communication with the Coumadin Clinic.   Plan: Mr. & Steve Gomez and I have spoken on numerous occasions about my starting home visits after completion of home health visits. Mr. & Steve Gomez have a good working relationship with their home health nurse. I will call Steve Gomez next week and we will schedule a home visit if his home care  nurse is anticipated to close his case after next week's visits.    [place] Management  [phone]

## 2016-05-18 ENCOUNTER — Encounter: Payer: Self-pay | Admitting: *Deleted

## 2016-05-18 ENCOUNTER — Other Ambulatory Visit: Payer: Self-pay | Admitting: *Deleted

## 2016-05-18 ENCOUNTER — Ambulatory Visit (INDEPENDENT_AMBULATORY_CARE_PROVIDER_SITE_OTHER): Payer: Commercial Managed Care - HMO | Admitting: *Deleted

## 2016-05-18 DIAGNOSIS — Z7901 Long term (current) use of anticoagulants: Secondary | ICD-10-CM | POA: Diagnosis not present

## 2016-05-18 DIAGNOSIS — E785 Hyperlipidemia, unspecified: Secondary | ICD-10-CM | POA: Diagnosis not present

## 2016-05-18 DIAGNOSIS — J449 Chronic obstructive pulmonary disease, unspecified: Secondary | ICD-10-CM | POA: Diagnosis not present

## 2016-05-18 DIAGNOSIS — Z9981 Dependence on supplemental oxygen: Secondary | ICD-10-CM | POA: Diagnosis not present

## 2016-05-18 DIAGNOSIS — Z5181 Encounter for therapeutic drug level monitoring: Secondary | ICD-10-CM

## 2016-05-18 DIAGNOSIS — F17211 Nicotine dependence, cigarettes, in remission: Secondary | ICD-10-CM | POA: Diagnosis not present

## 2016-05-18 DIAGNOSIS — E43 Unspecified severe protein-calorie malnutrition: Secondary | ICD-10-CM | POA: Diagnosis not present

## 2016-05-18 DIAGNOSIS — I4891 Unspecified atrial fibrillation: Secondary | ICD-10-CM | POA: Diagnosis not present

## 2016-05-18 LAB — POCT INR: INR: 1.7

## 2016-05-18 NOTE — Patient Outreach (Signed)
[facility]) Care Management  05/18/2016  Steve Gomez 05/26/1941 [id number]  I was unable to reach [name] by phone today when I called. I left a HIPPA compliant voice message requesting a return call.   Plan: I will reach out to [name] by phone over the next 7 days.    [place] Management  [phone]

## 2016-05-19 DIAGNOSIS — G4733 Obstructive sleep apnea (adult) (pediatric): Secondary | ICD-10-CM | POA: Diagnosis not present

## 2016-05-20 ENCOUNTER — Other Ambulatory Visit: Payer: Self-pay | Admitting: *Deleted

## 2016-05-20 NOTE — Patient Outreach (Signed)
[facility]) Care Management  05/20/2016  Steve Gomez 1940-11-05 [id number]  I was unable to reach [name] today when I called. I did speak with [name] RN - Coumadin Clinic Coordinator at [facility], [name] who asked if I might be able to help with PT/INR draw during my October visit. I am holding a place on my schedule for Oct 5 @ 10:30 for home visit with PT/INR draw. At that time I will help [name] establish a routine for PT/INR draws for long term management as [facility] CM does not provide this service long term.   I left a HIPPA compliant message today requesting a return call.    [place] Management  [phone]

## 2016-05-29 DIAGNOSIS — G4733 Obstructive sleep apnea (adult) (pediatric): Secondary | ICD-10-CM | POA: Diagnosis not present

## 2016-05-31 ENCOUNTER — Telehealth: Payer: Self-pay | Admitting: *Deleted

## 2016-05-31 ENCOUNTER — Other Ambulatory Visit: Payer: Self-pay | Admitting: *Deleted

## 2016-05-31 DIAGNOSIS — I4891 Unspecified atrial fibrillation: Secondary | ICD-10-CM

## 2016-05-31 NOTE — Patient Outreach (Signed)
[facility] [facility]) Care Management   05/31/2016  Steve Gomez [birth year] [id number]  Steve Gomez is an 75 y.o. male with medical history significant for COPD, hypertension, hypercholesterolemia, and recently diagnosed atrial fibrillation. Steve Gomez was discharged to home from the hospital on 01/17/16 after an admission for abdominal pain and new Afib with RVR. Steve Gomez had extensive workup of abdominal pain and was discharged on Xarelto for his afib. He presented to the emergency department on 01/25/16 with complaints of dark looking diarrhea. Hemoglobin was noted to be 7.4. WBC was noted be 23.5. 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 He was eventually taken to the OR for aorto-mesenteric bypass 6/7 by vascular surgery and discharged to home on 02/09/16.   [facility] Care Management nursing and pharmacy services have been providing support to Steve Gomez since his hospital discharge. We have addressed provider follow up and care coordination assistance needs, medication management and patient assistance program needs, home health follow up, DME needs, nutrition, and post surgical care.  As of last week, Steve Gomez. I visited Steve Gomez today to assess his progress with afib management and his plan for long term management of Coumadin and INR.   Subjective: "I think I'm feeling nearly as good as I did before I got sick. I just don't quite have all of my energy back."  Objective:  Pulse 74   Wt 124 lb (56.2 kg)   SpO2 98%   BMI 21.97 kg/m   Review of Systems  Constitutional: Negative.   HENT: Negative.   Eyes: Negative.  Negative for redness.  Respiratory: Negative.   Cardiovascular: Negative.  Negative for chest pain.  Gastrointestinal: Negative.   Genitourinary: Negative.   Musculoskeletal: Negative.  Negative for falls.  Skin:  Negative.   Neurological: Negative.   Psychiatric/Behavioral: Negative.     Physical Exam  Constitutional: He is oriented to person, place, and time. Vital signs are normal. He appears well-developed and well-nourished. He is active.  Non-toxic appearance. He does not have a sickly appearance. He does not appear ill.  Cardiovascular: Normal rate and normal heart sounds.  An irregular rhythm present.  Respiratory: Effort normal and breath sounds normal. He has no wheezes. He has no rhonchi. He has no rales.  GI: Soft. Bowel sounds are normal.  Neurological: He is alert and oriented to person, place, and time.  Skin: Skin is warm and dry.  Psychiatric: He has a normal mood and affect. His speech is normal and behavior is normal. Judgment and thought content normal. Cognition and memory are normal.    Encounter Medications:   Outpatient Encounter Prescriptions as of 05/31/2016  Medication Sig Note  . acetaminophen (TYLENOL) 500 MG tablet Take 500 mg by mouth every 6 (six) hours as needed for mild pain or moderate pain. 03/18/2016: On hand; takes prn  . amiodarone (PACERONE) 200 MG tablet Take 1 tablet (200 mg total) by mouth daily.   . chlorthalidone (HYGROTON) 25 MG tablet Take 25 mg by mouth daily.   . famotidine (PEPCID) 20 MG tablet Take 1 tablet (20 mg total) by mouth daily.   . Multiple Vitamin (MULTIVITAMIN) capsule Take 1 capsule by mouth daily.   . Multiple Vitamins-Minerals (MULTIVITAMINS THER. W/MINERALS) TABS tablet Take 1 tablet by mouth daily.   . nicotine (NICODERM CQ - DOSED IN MG/24 HOURS) 14 mg/24hr patch Place 1 patch (  14 mg total) onto the skin daily. 01/19/2016: Has prescription; chose not to fill  . OXYGEN Inhale 2 L into the lungs at bedtime.   . simvastatin (ZOCOR) 40 MG tablet Take 1 tablet by mouth daily.   . tamsulosin (FLOMAX) 0.4 MG CAPS capsule Take 1 capsule (0.4 mg total) by mouth daily.   Marland Kitchen warfarin (COUMADIN) 3 MG tablet Take 1/2 tablet daily except 1 tablet on  Mondays, Wednesdays and Fridays    Assessment:  75 year old male with new diagnosis afib and prolonged hospitalization related to aorto-mesenteric bypass for mesenteric ischemia with chronic IMA occulision and acute on chronic SMA occlusion. He has been followed at home by home care for assistance with post surgical care and INR/Coumadin management. Steve Gomez medications and adhering to prescribed plan of care exactly as directed. He is seeing his providers as scheduled. He is currently having some difficulty with his CPAP and is calling [facility] today for assistance. He is using O2 @ 2l/[place] at night.   Steve Gomez only current case management need is coordination of long term plan for INR checks. I performed venipuncture for INR today and delivered the specimen to [facility] in [place]. I contacted [name], RN - Coumadin Clinic Manager to collaborate on long term planning.    Plan:   Mr. Curren will continue Coumadin dosing as prescribed 2 weeks ago, until he hears from the Coumadin Clinic.   I will follow up with [name] and Mr. Giovannoni and his wife later this week.    [place] Management  [phone]

## 2016-05-31 NOTE — Telephone Encounter (Signed)
Done

## 2016-05-31 NOTE — Telephone Encounter (Signed)
Could you print an order for this pt's PT/INR for Fonnie Jarvis to pick up.

## 2016-06-01 ENCOUNTER — Ambulatory Visit (INDEPENDENT_AMBULATORY_CARE_PROVIDER_SITE_OTHER): Payer: Commercial Managed Care - HMO | Admitting: *Deleted

## 2016-06-01 DIAGNOSIS — I4891 Unspecified atrial fibrillation: Secondary | ICD-10-CM

## 2016-06-01 DIAGNOSIS — Z5181 Encounter for therapeutic drug level monitoring: Secondary | ICD-10-CM

## 2016-06-01 LAB — PROTIME-INR
INR: 1.9 — AB
PROTHROMBIN TIME: 20.1 s — AB (ref 9.0–11.5)

## 2016-06-03 ENCOUNTER — Ambulatory Visit: Payer: Commercial Managed Care - HMO | Admitting: *Deleted

## 2016-06-03 ENCOUNTER — Other Ambulatory Visit: Payer: Self-pay | Admitting: *Deleted

## 2016-06-03 MED ORDER — WARFARIN SODIUM 3 MG PO TABS: ORAL_TABLET | ORAL | 3 refills | Status: DC

## 2016-06-04 ENCOUNTER — Other Ambulatory Visit: Payer: Self-pay | Admitting: *Deleted

## 2016-06-04 ENCOUNTER — Encounter: Payer: Self-pay | Admitting: *Deleted

## 2016-06-04 NOTE — Patient Outreach (Signed)
[facility]) Care Management  06/04/2016  Steve Gomez 1940/12/11 [id number]   Steve Gomez is an 76 y.o. male with medical history significant for COPD, hypertension, hypercholesterolemia, and recently diagnosed atrial fibrillation. Steve Gomez was discharged to home from the hospital on 01/17/16 after an admission for abdominal pain and new Afib with RVR. SteveGomez had extensive workup of abdominal pain and was discharged on Xarelto for his afib. He presented to the emergency department on 01/25/16 with complaints of dark looking diarrhea. Hemoglobin was noted to be 7.4. WBC was noted be 23.5. 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 He was eventually taken to the OR for aorto-mesenteric bypass 6/7 by vascular surgery and discharged to home on 02/09/16.   [facility] Care Management nursing and pharmacy services have been providing support to [name] since his hospital discharge. We have addressed provider follow up and care coordination assistance needs, medication management and patient assistance program needs, home health follow up, DME needs, nutrition, and post surgical care.  As of 2 weeks ago, Steve Gomez home health visits are completed. I visited Steve Gomez last week to assess his progress with afib management and his plan for long term management of Coumadin and INR.   His INR was 1.9 and his Coumadin was adjusted to Increase coumadin to 1 tablet daily except 1/2 tablet on Sundays and Thursdays by Coumadin Clinic RN [name]. Information was called directly to Mr. & Mrs. Randolf by Ms. Reid and I confirmed her understanding of the dosing change by phone today.  Plan: I will see Steve Gomez at home on 06/15/16 @ 11am. In the interim, I will continue to investigate local labs who participate in Mr. [facility] health plan and will be able to help with INR checks long term.     [place] Management  [phone]

## 2016-06-10 ENCOUNTER — Other Ambulatory Visit: Payer: Self-pay | Admitting: *Deleted

## 2016-06-10 MED ORDER — WARFARIN SODIUM 3 MG PO TABS: ORAL_TABLET | ORAL | 3 refills | Status: DC

## 2016-06-14 ENCOUNTER — Other Ambulatory Visit: Payer: Self-pay | Admitting: *Deleted

## 2016-06-14 MED ORDER — WARFARIN SODIUM 3 MG PO TABS: ORAL_TABLET | ORAL | 3 refills | Status: DC

## 2016-06-15 ENCOUNTER — Other Ambulatory Visit: Payer: Self-pay | Admitting: *Deleted

## 2016-06-15 ENCOUNTER — Encounter: Payer: Self-pay | Admitting: *Deleted

## 2016-06-15 DIAGNOSIS — I4891 Unspecified atrial fibrillation: Secondary | ICD-10-CM | POA: Diagnosis not present

## 2016-06-15 NOTE — Patient Outreach (Signed)
[facility]) Care Management   06/15/2016  Ayan Heffington Aug 06, 1941 [id number]  Fintan Grater is an 75 y.o. male with medical history significant for COPD, hypertension, hypercholesterolemia, and recently diagnosed atrial fibrillation. Mr. Luckett was discharged to home from the hospital on 01/17/16 after an admission for abdominal pain and new Afib with RVR. Mr.Carden had extensive workup of abdominal pain and was discharged on Xarelto for his afib. He presented to the emergency department on 01/25/16 with complaints of dark looking diarrhea. Hemoglobin was noted to be 7.4. WBC was noted be 23.5. GI was consulted and Mr. Sanon was admitted and found to have mesenteric ischemia with chronic IMA occulision and acute on chronic SMA occlusion. He was eventually taken to the OR for aorto-mesenteric bypass 6/7 by vascular surgery and discharged to home on 02/09/16.   [facility] Care Management nursing and pharmacy services have been providing support to Mr. Murakami since his hospital discharge. We have addressed provider follow up and care coordination assistance needs, medication management and patient assistance program needs, home health follow up, DME needs, nutrition, and post surgical care.  Subjective: "I think I'm doing just as good as I can be."  Objective:  BP 112/60   Pulse 74   Wt 132 lb (59.9 kg)   SpO2 98%   BMI 23.38 kg/m   Review of Systems  Constitutional: Negative.   HENT: Negative.   Eyes: Negative.   Respiratory: Negative.   Cardiovascular: Negative.  Negative for leg swelling.  Gastrointestinal: Negative.   Genitourinary: Negative.   Musculoskeletal: Negative.  Negative for falls.  Skin: Negative.   Neurological: Negative.   Psychiatric/Behavioral: Negative.     Physical Exam  Constitutional: He is oriented to person, place, and time. Vital signs are normal. He appears well-developed and well-nourished. He is active.  Non-toxic appearance. He does not  have a sickly appearance. He does not appear ill.  Cardiovascular: Normal rate.  An irregular rhythm present.  Respiratory: Effort normal. He has no wheezes. He has no rhonchi. He has no rales.  GI: Soft. Bowel sounds are normal.  Neurological: He is alert and oriented to person, place, and time.  Skin: Skin is warm and dry.  Psychiatric: He has a normal mood and affect. His speech is normal and behavior is normal. Judgment and thought content normal. Cognition and memory are normal.    Encounter Medications:   Outpatient Encounter Prescriptions as of 06/15/2016  Medication Sig Note  . acetaminophen (TYLENOL) 500 MG tablet Take 500 mg by mouth every 6 (six) hours as needed for mild pain or moderate pain. 03/18/2016: On hand; takes prn  . amiodarone (PACERONE) 200 MG tablet Take 1 tablet (200 mg total) by mouth daily.   Marland Kitchen atorvastatin (LIPITOR) 20 MG tablet Take 20 mg by mouth daily.   . chlorthalidone (HYGROTON) 25 MG tablet Take 25 mg by mouth daily.   . famotidine (PEPCID) 20 MG tablet Take 1 tablet (20 mg total) by mouth daily.   . Multiple Vitamin (MULTIVITAMIN) capsule Take 1 capsule by mouth daily.   . Multiple Vitamins-Minerals (MULTIVITAMINS THER. W/MINERALS) TABS tablet Take 1 tablet by mouth daily.   . nicotine (NICODERM CQ - DOSED IN MG/24 HOURS) 14 mg/24hr patch Place 1 patch (14 mg total) onto the skin daily. (Patient not taking: Reported on 05/31/2016) 01/19/2016: Has prescription; chose not to fill  . OXYGEN Inhale 2 L into the lungs at bedtime.   . simvastatin (ZOCOR) 40 MG tablet Take 1 tablet  by mouth daily.   . tamsulosin (FLOMAX) 0.4 MG CAPS capsule Take 1 capsule (0.4 mg total) by mouth daily.   Marland Kitchen warfarin (COUMADIN) 3 MG tablet Take 1 tablet daily except 1/2 tablet on Sundays and Thursdays     Assessment:  75 year old male with new diagnosis afib and prolonged hospitalization related to aorto-mesenteric bypass for mesenteric ischemia with chronic IMA occulision and acute  on chronic SMA occlusion. He has been followed at home by home care for assistance with post surgical care and INR/Coumadin management. Mr. Lampe has been taking medications and adhering to prescribed plan of care exactly as directed. He is seeing his providers as scheduled. He is using O2 @ 2l/[place] at night and is using his CPAP as prescribed Huey Romans is DME provider).   Mr. Mandt only current case management need is coordination of long term plan for INR checks. I performed venipuncture for INR today and delivered the specimen to [facility] in [place]. I contacted [name], RN - Coumadin Clinic Manager to collaborate on long term planning.   All of Mr. Purdum short and long term case management goals have been met. He and his wife say they feel "grateful" for how far Mr. Lessley has come and feel confident about their ability to manage Mr. [facility] healthcare needs independently. They agree to discontinuation of care management services and do not feel they would benefit from telephonic case management follow up. I left Mr.and Mrs. Kiraly with my card and encouraged them to call me if at any time in the future they had concerns or questions with which I could provide assistance.    Plan:   Mr. Mantel will continue Coumadin dosing as prescribed until he hears from the Coumadin Clinic.   Mr. Sproles will report to the Coumadin Clinic at [place] hereafter for INR checks.   Case Closure.    [place] Management  [phone]

## 2016-06-16 ENCOUNTER — Encounter: Payer: Self-pay | Admitting: *Deleted

## 2016-06-17 ENCOUNTER — Ambulatory Visit (INDEPENDENT_AMBULATORY_CARE_PROVIDER_SITE_OTHER): Payer: Commercial Managed Care - HMO | Admitting: *Deleted

## 2016-06-17 DIAGNOSIS — Z5181 Encounter for therapeutic drug level monitoring: Secondary | ICD-10-CM

## 2016-06-17 DIAGNOSIS — I4891 Unspecified atrial fibrillation: Secondary | ICD-10-CM

## 2016-06-17 LAB — POCT INR: INR: 2.3

## 2016-06-18 DIAGNOSIS — G4733 Obstructive sleep apnea (adult) (pediatric): Secondary | ICD-10-CM | POA: Diagnosis not present

## 2016-06-25 ENCOUNTER — Other Ambulatory Visit: Payer: Self-pay | Admitting: Pharmacist

## 2016-06-25 MED ORDER — WARFARIN SODIUM 3 MG PO TABS
ORAL_TABLET | ORAL | 1 refills | Status: DC
Start: 2016-06-25 — End: 2017-07-11

## 2016-06-28 DIAGNOSIS — G4733 Obstructive sleep apnea (adult) (pediatric): Secondary | ICD-10-CM | POA: Diagnosis not present

## 2016-07-05 ENCOUNTER — Other Ambulatory Visit: Payer: Self-pay | Admitting: *Deleted

## 2016-07-05 NOTE — Patient Outreach (Signed)
[facility]) Care Management  07/05/2016  Steve Gomez 1941-01-22 [id number]  I reached out to [name] and his wife today to follow up on a call to the 24/7 nurse line late last week. I spoke with Mrs. Shapley who told me that she thought she'd given [name] a whole 3mg  Coumadin tablet on Thursday rather than the prescribed 1/2 tablet. She called the 24 hour nurse line late on Friday when she was getting out [name] evening tablet and couldn't recall breaking his Thursday tablet in half. Mrs. Cumbo was advised by the 24/7 nurse line to follow up with the prescribing provider. Mrs. Midgett did so and was told that [name] should continue with his routine prescribed regimen (Coumadin 3mg  tablet: Take 1 tablet daily except 1/2 tablet on Sundays and Thursdays).   I reviewed signs and symptoms of bleeding with Mrs. Huether. She denied that [name] has had any signs or symptoms of bleeding. [name] is scheduled for a PT/INR check at [facility] on Wednesday.   Plan: Mr. & Mrs. Downie will reach out to [facility] Coumadin Clinic for any questions/concerns re: Coumadin management.    [place] Management  [phone]

## 2016-07-07 ENCOUNTER — Ambulatory Visit (INDEPENDENT_AMBULATORY_CARE_PROVIDER_SITE_OTHER): Payer: Commercial Managed Care - HMO | Admitting: *Deleted

## 2016-07-07 DIAGNOSIS — I4891 Unspecified atrial fibrillation: Secondary | ICD-10-CM

## 2016-07-07 DIAGNOSIS — Z5181 Encounter for therapeutic drug level monitoring: Secondary | ICD-10-CM

## 2016-07-07 LAB — POCT INR: INR: 2.9

## 2016-07-19 DIAGNOSIS — G4733 Obstructive sleep apnea (adult) (pediatric): Secondary | ICD-10-CM | POA: Diagnosis not present

## 2016-07-29 DIAGNOSIS — G4733 Obstructive sleep apnea (adult) (pediatric): Secondary | ICD-10-CM | POA: Diagnosis not present

## 2016-08-01 NOTE — Progress Notes (Signed)
Cardiology Office Note   Date:  08/02/2016   ID:  Benjamen Napieralski, DOB Jun 05, 1941, MRN [id number]  PCP:  Alonza Bogus, MD  Cardiologist: Lamar Sprinkles, NP   No chief complaint on file.     History of Present Illness: Steve Gomez is a 75 y.o. male who presents for ongoing assessment and management of atrial fibrillation, mesenteric stenosis, with bypass, s/p AAA, hypertension, hypercholesterolemia. Was last seen in the office on 02/26/2016. He was clinically stable and was continued on coumadin, with INR checks in the office in [place].   Echocardiogram 01/14/2016  Left ventricle: The cavity size was normal. Wall thickness was  normal. Systolic function was normal. The estimated ejection  fraction was in the range of 55% to 60%. Wall motion was normal;  there were no regional wall motion abnormalities. - Mitral valve: Calcified annulus. There was moderate  regurgitation. - Tricuspid valve: There was moderate regurgitation.  He is here today without any cardiac complaints. He denies rapid heart rhythm, breathing issues, chest pain, he has no excessive bleeding or bruising. He is medically compliant.  Past Medical History:  Diagnosis Date  . Atherosclerosis   . Bladder outlet obstruction 01/14/2016  . COPD (chronic obstructive pulmonary disease) ([place])   . High cholesterol   . Hypertension   . New onset atrial fibrillation ([place])   . Renal calculi 01/14/2016  . S/P AAA repair   . Tobacco abuse     Past Surgical History:  Procedure Laterality Date  . ABDOMINAL AORTIC ANEURYSM REPAIR    . CHOLECYSTECTOMY    . COLONOSCOPY N/A 01/27/2016   Procedure: COLONOSCOPY;  Surgeon: [name], MD;  Location: [facility] END[facility] SUITE;  Service: Endoscopy;  Laterality: N/A;  . ESOPHAGOGASTRODUODENOSCOPY N/A 01/27/2016   Procedure: ESOPHAGOGASTRODUODENOSCOPY (EGD);  Surgeon: [name], MD;  Location: [facility] ENDO SUITE;  Service: Endoscopy;  Laterality: N/A;  . MESENTERIC ARTERY  BYPASS N/A 02/04/2016   Procedure: AORTO-SUPERIOR MESENTERIC ARTERY BYPASS GRAFT USING 6MM X 30 CM HEMASHIELD GOLD GRAFT;  Surgeon: [name], MD;  Location: [facility] OR;  Service: Vascular;  Laterality: N/A;     Current Outpatient Prescriptions  Medication Sig Dispense Refill  . acetaminophen (TYLENOL) 500 MG tablet Take 500 mg by mouth every 6 (six) hours as needed for mild pain or moderate pain.    Marland Kitchen amiodarone (PACERONE) 200 MG tablet Take 1 tablet (200 mg total) by mouth daily. 90 tablet 3  . atorvastatin (LIPITOR) 20 MG tablet Take 20 mg by mouth daily.    . chlorthalidone (HYGROTON) 25 MG tablet Take 25 mg by mouth daily.    . famotidine (PEPCID) 20 MG tablet Take 1 tablet (20 mg total) by mouth daily. 30 tablet 0  . Multiple Vitamin (MULTIVITAMIN) capsule Take 1 capsule by mouth daily.    . Multiple Vitamins-Minerals (MULTIVITAMINS THER. W/MINERALS) TABS tablet Take 1 tablet by mouth daily.    . OXYGEN Inhale 2 L into the lungs at bedtime.    . tamsulosin (FLOMAX) 0.4 MG CAPS capsule Take 1 capsule (0.4 mg total) by mouth daily. 30 capsule 0  . warfarin (COUMADIN) 3 MG tablet Take 1 tablet daily except 1/2 tablet on Sundays and Thursdays 90 tablet 1   No current facility-administered medications for this visit.     Allergies:   Patient has no known allergies.    Social History:  The patient  reports that he quit smoking about 5 months ago. His smoking use included Cigarettes. He started  smoking about 55 years ago. He smoked 1.00 pack per day. He has never used smokeless tobacco. He reports that he drinks alcohol. He reports that he does not use drugs.   Family History:  The patient's family history includes Diabetes in his brother, brother, and brother; Suicidality in his father.    ROS: All other systems are reviewed and negative. Unless otherwise mentioned in H&P    PHYSICAL EXAM: VS:  BP 120/64   Pulse 77   Ht 5\' 4"  (1.626 m)   Wt 143 lb (64.9 kg)   SpO2 95%   BMI 24.55  kg/m  , BMI Body mass index is 24.55 kg/m. GEN: Well nourished, well developed, in no acute distress  HEENT: normal  Neck: no JVD, carotid bruits, or masses Cardiac: RRR; no murmurs, rubs, or gallops,no edema  Respiratory:  clear to auscultation bilaterally, normal work of breathing GI: soft, nontender, nondistended, + BS MS: no deformity or atrophy  Skin: warm and dry, no rash Neuro:  Strength and sensation are intact Psych: euthymic mood, full affect   EKG:  Normal sinus rhythm with right bundle branch block, heart rate is 79 bpm.  Recent Labs: 01/14/2016: TSH 1.248 02/05/2016: ALT 12 02/09/2016: BUN <5; Creatinine, Ser 0.59; Magnesium 1.5; Potassium 4.2; Sodium 137 02/12/2016: Hemoglobin 8.7; Platelets 249    Lipid Panel    Component Value Date/Time   CHOL 77 01/15/2016 0223   TRIG 107 01/15/2016 0223   HDL 20 (L) 01/15/2016 0223   CHOLHDL 3.9 01/15/2016 0223   VLDL 21 01/15/2016 0223   LDLCALC 36 01/15/2016 0223      Wt Readings from Last 3 Encounters:  08/02/16 143 lb (64.9 kg)  06/15/16 132 lb (59.9 kg)  05/31/16 124 lb (56.2 kg)      ASSESSMENT AND PLAN:  1. Atrial fibrillation: Heart rate currently is regular, he is tolerating amiodarone 200 mg daily, remains on anticoagulation therapy with Coumadin. He will be seen in our Coumadin clinic today. No changes in his medication regimen at this time. See him again in one year unless he is symptomatic. Annual labs are being completed by primary care physician Dr. Luan Pulling who is due to see in 2 days.  2. Hypercholesterolemia: Continue on statin therapy. Follow-up labs this week.  3. Hypertension: Excellent control of blood pressure. No changes in his regimen.   Current medicines are reviewed at length with the patient today.    Labs/ tests ordered today include:  Orders Placed This Encounter  Procedures  . EKG 12-Lead     Disposition:   FU with One year unless symptomatic.  Signed, Jory Sims, NP   08/02/2016 2:03 PM    [facility] [address] [place], [place] [address] Phone: [phone]; Fax: [phone]

## 2016-08-02 ENCOUNTER — Ambulatory Visit (INDEPENDENT_AMBULATORY_CARE_PROVIDER_SITE_OTHER): Payer: Commercial Managed Care - HMO | Admitting: Adult Health

## 2016-08-02 ENCOUNTER — Encounter: Payer: Self-pay | Admitting: Adult Health

## 2016-08-02 ENCOUNTER — Ambulatory Visit (INDEPENDENT_AMBULATORY_CARE_PROVIDER_SITE_OTHER): Payer: Commercial Managed Care - HMO | Admitting: *Deleted

## 2016-08-02 VITALS — BP 120/64 | HR 77 | Ht 64.0 in | Wt 143.0 lb

## 2016-08-02 DIAGNOSIS — Z5181 Encounter for therapeutic drug level monitoring: Secondary | ICD-10-CM | POA: Diagnosis not present

## 2016-08-02 DIAGNOSIS — I10 Essential (primary) hypertension: Secondary | ICD-10-CM

## 2016-08-02 DIAGNOSIS — I4891 Unspecified atrial fibrillation: Secondary | ICD-10-CM | POA: Diagnosis not present

## 2016-08-02 LAB — POCT INR: INR: 3

## 2016-08-02 NOTE — Patient Instructions (Signed)
Your physician wants you to follow-up in: 1 Year with Dr. Nishan. You will receive a reminder letter in the mail two months in advance. If you don't receive a letter, please call our office to schedule the follow-up appointment.  Your physician recommends that you continue on your current medications as directed. Please refer to the Current Medication list given to you today.  If you need a refill on your cardiac medications before your next appointment, please call your pharmacy.  Thank you for choosing [facility]!   

## 2016-08-02 NOTE — Progress Notes (Signed)
Name: Steve Gomez    DOB: 1941/04/10  Age: 75 y.o.  MR#: [id number]       PCP:  Alonza Bogus, MD      Insurance: Payor: [employer] MEDICARE / Plan: [place] THN/NTSP / Product Type: *No Product type* /   CC:   No chief complaint on file.   VS Vitals:   08/02/16 1321  BP: 120/64  Pulse: 77  SpO2: 95%  Weight: 143 lb (64.9 kg)  Height: 5\' 4"  (1.626 m)    Weights Current Weight  08/02/16 143 lb (64.9 kg)  06/15/16 132 lb (59.9 kg)  05/31/16 124 lb (56.2 kg)    Blood Pressure  BP Readings from Last 3 Encounters:  08/02/16 120/64  06/15/16 112/60  04/27/16 111/69     Admit date:  (Not on file) Last encounter with RMR:  05/06/2016   Allergy Patient has no known allergies.  Current Outpatient Prescriptions  Medication Sig Dispense Refill  . acetaminophen (TYLENOL) 500 MG tablet Take 500 mg by mouth every 6 (six) hours as needed for mild pain or moderate pain.    Marland Kitchen amiodarone (PACERONE) 200 MG tablet Take 1 tablet (200 mg total) by mouth daily. 90 tablet 3  . atorvastatin (LIPITOR) 20 MG tablet Take 20 mg by mouth daily.    . chlorthalidone (HYGROTON) 25 MG tablet Take 25 mg by mouth daily.    . famotidine (PEPCID) 20 MG tablet Take 1 tablet (20 mg total) by mouth daily. 30 tablet 0  . Multiple Vitamin (MULTIVITAMIN) capsule Take 1 capsule by mouth daily.    . Multiple Vitamins-Minerals (MULTIVITAMINS THER. W/MINERALS) TABS tablet Take 1 tablet by mouth daily.    . OXYGEN Inhale 2 L into the lungs at bedtime.    . tamsulosin (FLOMAX) 0.4 MG CAPS capsule Take 1 capsule (0.4 mg total) by mouth daily. 30 capsule 0  . warfarin (COUMADIN) 3 MG tablet Take 1 tablet daily except 1/2 tablet on Sundays and Thursdays 90 tablet 1   No current facility-administered medications for this visit.     Discontinued Meds:    Medications Discontinued During This Encounter  Medication Reason  . simvastatin (ZOCOR) 40 MG tablet Error  . nicotine (NICODERM CQ - DOSED IN MG/24  HOURS) 14 mg/24hr patch Error    Patient Active Problem List   Diagnosis Date Noted  . Encounter for therapeutic drug monitoring 02/10/2016  . Pressure ulcer 02/02/2016  . Mesenteric ischemia ([place])   . Diarrhea   . Ischemic disease of gut ([place])   . Protein-calorie malnutrition, severe 01/26/2016  . Acute blood loss anemia 01/25/2016  . Rectal bleeding 01/25/2016  . New onset atrial fibrillation ([place])   . Renal calculi 01/14/2016  . Bladder outlet obstruction 01/14/2016  . Atrial fibrillation with RVR ([place]) 01/12/2016  . Ischemia of small intestine ([place]) 01/12/2016  . COPD (chronic obstructive pulmonary disease) ([place]) 01/12/2016  . Hypertension 01/12/2016  . High cholesterol 01/12/2016  . Hypokalemia 01/12/2016  . Tobacco abuse   . Abdominal pain 01/11/2016    LABS    Component Value Date/Time   NA 137 02/09/2016 0350   NA 135 02/08/2016 0308   NA 136 02/07/2016 0350   K 4.2 02/09/2016 0350   K 3.9 02/08/2016 0308   K 4.6 02/07/2016 0350   CL 101 02/09/2016 0350   CL 103 02/08/2016 0308   CL 102 02/07/2016 0350   CO2 31 02/09/2016 0350   CO2 27 02/08/2016 0308  CO2 31 02/07/2016 0350   GLUCOSE 117 (H) 02/09/2016 0350   GLUCOSE 114 (H) 02/08/2016 0308   GLUCOSE 97 02/07/2016 0350   BUN <5 (L) 02/09/2016 0350   BUN <5 (L) 02/08/2016 0308   BUN <5 (L) 02/07/2016 0350   CREATININE 0.59 (L) 02/09/2016 0350   CREATININE 0.54 (L) 02/08/2016 0308   CREATININE 0.57 (L) 02/07/2016 0350   CALCIUM 8.4 (L) 02/09/2016 0350   CALCIUM 7.9 (L) 02/08/2016 0308   CALCIUM 8.1 (L) 02/07/2016 0350   GFRNONAA >60 02/09/2016 0350   GFRNONAA >60 02/08/2016 0308   GFRNONAA >60 02/07/2016 0350   GFRAA >60 02/09/2016 0350   GFRAA >60 02/08/2016 0308   GFRAA >60 02/07/2016 0350   CMP     Component Value Date/Time   NA 137 02/09/2016 0350   K 4.2 02/09/2016 0350   CL 101 02/09/2016 0350   CO2 31 02/09/2016 0350   GLUCOSE 117 (H) 02/09/2016 0350   BUN <5 (L) 02/09/2016 0350    CREATININE 0.59 (L) 02/09/2016 0350   CALCIUM 8.4 (L) 02/09/2016 0350   PROT 4.3 (L) 02/05/2016 0422   ALBUMIN 2.1 (L) 02/05/2016 0422   AST 16 02/05/2016 0422   ALT 12 (L) 02/05/2016 0422   ALKPHOS 91 02/05/2016 0422   BILITOT 0.6 02/05/2016 0422   GFRNONAA >60 02/09/2016 0350   GFRAA >60 02/09/2016 0350       Component Value Date/Time   WBC 8.1 02/12/2016 1522   WBC 7.6 02/09/2016 0350   WBC 7.8 02/08/2016 0308   HGB 8.7 (L) 02/12/2016 1522   HGB 8.7 (L) 02/09/2016 0350   HGB 7.8 (L) 02/08/2016 0308   HCT 28.6 (L) 02/12/2016 1522   HCT 29.0 (L) 02/09/2016 0350   HCT 25.9 (L) 02/08/2016 0308   MCV 84.6 02/12/2016 1522   MCV 84.3 02/09/2016 0350   MCV 85.5 02/08/2016 0308    Lipid Panel     Component Value Date/Time   CHOL 77 01/15/2016 0223   TRIG 107 01/15/2016 0223   HDL 20 (L) 01/15/2016 0223   CHOLHDL 3.9 01/15/2016 0223   VLDL 21 01/15/2016 0223   LDLCALC 36 01/15/2016 0223    ABG    Component Value Date/Time   PHART 7.352 02/04/2016 1310   PCO2ART 51.0 (H) 02/04/2016 1310   PO2ART 67.6 (L) 02/04/2016 1310   HCO3 27.7 (H) 02/04/2016 1310   TCO2 29.3 02/04/2016 1310   O2SAT 90.8 02/04/2016 1310     Lab Results  Component Value Date   TSH 1.248 01/14/2016   BNP (last 3 results) No results for input(s): BNP in the last 8760 hours.  ProBNP (last 3 results) No results for input(s): PROBNP in the last 8760 hours.  Cardiac Panel (last 3 results) No results for input(s): CKTOTAL, CKMB, TROPONINI, RELINDX in the last 72 hours.  Iron/TIBC/Ferritin/ %Sat No results found for: IRON, TIBC, FERRITIN, IRONPCTSAT   EKG Orders placed or performed during the hospital encounter of 01/25/16  . ED EKG  . ED EKG  . EKG 12-Lead  . EKG 12-Lead  . EKG 12-Lead  . EKG 12-Lead  . EKG  . EKG 12-Lead  . EKG 12-Lead  . EKG 12-Lead  . EKG 12-Lead     Prior Assessment and Plan Problem List as of 08/02/2016 Reviewed: 06/15/2016  9:22 AM by Army Melia, RN      Cardiovascular and Mediastinum   Atrial fibrillation with RVR ([place])   Ischemia of small intestine ([place])  Hypertension   New onset atrial fibrillation (HCC)   Ischemic disease of gut (HCC)   Mesenteric ischemia (HCC)     Respiratory   COPD (chronic obstructive pulmonary disease) (H[facility])     Digestive   Rectal bleeding     Musculoskeletal and Integument   Pressure ulcer     Genitourinary   Renal calculi   Bladder outlet obstruction     Other   Abdominal pain   High cholesterol   Hypokalemia   Tobacco abuse   Acute blood loss anemia   Protein-calorie malnutrition, severe   Diarrhea   Encounter for therapeutic drug monitoring       Imaging: No results found.

## 2016-08-04 DIAGNOSIS — G4733 Obstructive sleep apnea (adult) (pediatric): Secondary | ICD-10-CM | POA: Diagnosis not present

## 2016-08-04 DIAGNOSIS — I4891 Unspecified atrial fibrillation: Secondary | ICD-10-CM | POA: Diagnosis not present

## 2016-08-04 DIAGNOSIS — I25119 Atherosclerotic heart disease of native coronary artery with unspecified angina pectoris: Secondary | ICD-10-CM | POA: Diagnosis not present

## 2016-08-04 DIAGNOSIS — I10 Essential (primary) hypertension: Secondary | ICD-10-CM | POA: Diagnosis not present

## 2016-08-04 DIAGNOSIS — J449 Chronic obstructive pulmonary disease, unspecified: Secondary | ICD-10-CM | POA: Diagnosis not present

## 2016-08-16 ENCOUNTER — Telehealth: Payer: Self-pay | Admitting: *Deleted

## 2016-08-16 NOTE — Telephone Encounter (Signed)
LMOM that is it OK for Mr Steve Gomez to take coumadin with new iron pill.

## 2016-08-16 NOTE — Telephone Encounter (Signed)
Patient was put on Iron pill by Dr.Hawkins. Questioning if this is ok with him being on Warfarin. / tg

## 2016-08-18 DIAGNOSIS — G4733 Obstructive sleep apnea (adult) (pediatric): Secondary | ICD-10-CM | POA: Diagnosis not present

## 2016-08-20 ENCOUNTER — Telehealth: Payer: Self-pay | Admitting: Gastroenterology

## 2016-08-20 NOTE — Telephone Encounter (Signed)
Patient with h/o mesenteric ischemia. Underwent bypass. Please make routine follow up ov, anemia and h/o mesenteric ischemia.

## 2016-08-25 ENCOUNTER — Encounter: Payer: Self-pay | Admitting: Internal Medicine

## 2016-08-25 NOTE — Telephone Encounter (Signed)
APPT MADE AND LETTER SENT  °

## 2016-08-28 DIAGNOSIS — G4733 Obstructive sleep apnea (adult) (pediatric): Secondary | ICD-10-CM | POA: Diagnosis not present

## 2016-08-31 DIAGNOSIS — Z1211 Encounter for screening for malignant neoplasm of colon: Secondary | ICD-10-CM | POA: Diagnosis not present

## 2016-09-01 ENCOUNTER — Ambulatory Visit (INDEPENDENT_AMBULATORY_CARE_PROVIDER_SITE_OTHER): Payer: Commercial Managed Care - HMO | Admitting: *Deleted

## 2016-09-01 DIAGNOSIS — I4891 Unspecified atrial fibrillation: Secondary | ICD-10-CM | POA: Diagnosis not present

## 2016-09-01 DIAGNOSIS — Z5181 Encounter for therapeutic drug level monitoring: Secondary | ICD-10-CM

## 2016-09-01 LAB — POCT INR: INR: 3.3

## 2016-09-09 ENCOUNTER — Encounter: Payer: Self-pay | Admitting: Gastroenterology

## 2016-09-09 ENCOUNTER — Ambulatory Visit (INDEPENDENT_AMBULATORY_CARE_PROVIDER_SITE_OTHER): Payer: Medicare HMO | Admitting: Gastroenterology

## 2016-09-09 DIAGNOSIS — D649 Anemia, unspecified: Secondary | ICD-10-CM | POA: Diagnosis not present

## 2016-09-09 DIAGNOSIS — D500 Iron deficiency anemia secondary to blood loss (chronic): Secondary | ICD-10-CM | POA: Insufficient documentation

## 2016-09-09 NOTE — Patient Instructions (Signed)
1. We will contact you once I receive labs from Dr. Luan Pulling for review.

## 2016-09-09 NOTE — Progress Notes (Signed)
Primary Care Physician: Alonza Bogus, MD  Primary Gastroenterologist:  Garfield Cornea, MD   Chief Complaint  Patient presents with  . Anemia    HPI: Steve Gomez is a 76 y.o. male here for follow-up of history of GI bleeding, mesenteric ischemia, anemia. Patient was seen during hospitalization last May. Presented to the ER with maroon stools in the setting of anticoagulation for A. fib. Hemoglobin 7.4. A week and a half prior to this admission he was at time for abdominal pain and had a CT that showed air in the left portal venous system. He improved with antibiotic therapy. No explanation for this finding noted. He also complained of underside amount of weight loss over several years with frequent screening abdominal pain. Dr. Gala Romney reviewed CT from May with radiologist and there was no evidence of aortoenteric fistula (status post repair of AAA).  Significant mesenteric atherosclerotic disease but no critical stenosis appreciated. Again, air was confirmed in the left portal venous system. In addition, retrospectively, there appears to be a segment of abnormal small bowel with pneumatosis present.  Obstructing right renal calculus also noted. He was also noted to have positive C. difficile antigen but negative toxin but given clinical presentation was treated.  Patient underwent EGD and colonoscopy and was found to have markedly abnormal stomach and duodenum and marked inflammatory changes with ulceration involving most of the proximal colon extending up into the terminal ileum all which-concern for gut ischemia. Patient ultimately transferred to [facility] and underwent aorta mesenteric bypass by Dr. Curt Jews.   Patient states he's doing very well. He recovered nicely from his surgery. He is able to eat. Gained from 112 pounds up to 150 pounds. No postprandial pain. Bowel movements regular. No melena rectal bleeding. Denies abdominal pain, GERD. He notes that he had recent blood work  which showed anemia, he was heme positive. Started on iron supplement per PCP.  Current Outpatient Prescriptions  Medication Sig Dispense Refill  . acetaminophen (TYLENOL) 500 MG tablet Take 500 mg by mouth every 6 (six) hours as needed for mild pain or moderate pain.    Marland Kitchen amiodarone (PACERONE) 200 MG tablet Take 1 tablet (200 mg total) by mouth daily. 90 tablet 3  . atorvastatin (LIPITOR) 20 MG tablet Take 20 mg by mouth daily.    . chlorthalidone (HYGROTON) 25 MG tablet Take 25 mg by mouth daily.    . famotidine (PEPCID) 20 MG tablet Take 1 tablet (20 mg total) by mouth daily. 30 tablet 0  . ferrous sulfate 325 (65 FE) MG tablet Take 325 mg by mouth daily with breakfast.    . Multiple Vitamin (MULTIVITAMIN) capsule Take 1 capsule by mouth daily.    . Multiple Vitamins-Minerals (MULTIVITAMINS THER. W/MINERALS) TABS tablet Take 1 tablet by mouth daily.    . OXYGEN Inhale 2 L into the lungs at bedtime.    . tamsulosin (FLOMAX) 0.4 MG CAPS capsule Take 1 capsule (0.4 mg total) by mouth daily. 30 capsule 0  . warfarin (COUMADIN) 3 MG tablet Take 1 tablet daily except 1/2 tablet on Sundays and Thursdays 90 tablet 1   No current facility-administered medications for this visit.     Allergies as of 09/09/2016  . (No Known Allergies)   Past Medical History:  Diagnosis Date  . Atherosclerosis   . Bladder outlet obstruction 01/14/2016  . COPD (chronic obstructive pulmonary disease) ([place])   . High cholesterol   . Hypertension   . New onset atrial  fibrillation ([place])   . Renal calculi 01/14/2016  . S/P AAA repair   . Tobacco abuse    Past Surgical History:  Procedure Laterality Date  . ABDOMINAL AORTIC ANEURYSM REPAIR    . CHOLECYSTECTOMY    . COLONOSCOPY N/A 01/27/2016   Procedure: COLONOSCOPY;  Surgeon: [name], MD;  Location: [facility];  Service: Endoscopy;  Laterality: N/A;  . ESOPHAGOGASTRODUODENOSCOPY N/A 01/27/2016   Procedure: ESOPHAGOGASTRODUODENOSCOPY (EGD);  Surgeon:  [name], MD;  Location: [facility];  Service: Endoscopy;  Laterality: N/A;  . MESENTERIC ARTERY BYPASS N/A 02/04/2016   Procedure: AORTO-SUPERIOR MESENTERIC ARTERY BYPASS GRAFT USING 6MM X 30 CM HEMASHIELD GOLD GRAFT;  Surgeon: [name], MD;  [facility];  Service: Vascular;  Laterality: N/A;    ROS:  General: Negative for anorexia, weight loss, fever, chills, fatigue, weakness. ENT: Negative for hoarseness, difficulty swallowing , nasal congestion. CV: Negative for chest pain, angina, palpitations, dyspnea on exertion, peripheral edema.  Respiratory: Negative for dyspnea at rest, dyspnea on exertion, cough, sputum, wheezing.  GI: See history of present illness. GU:  Negative for dysuria, hematuria, urinary incontinence, urinary frequency, nocturnal urination.  Endo: Negative for unusual weight change.    Physical Examination:   BP (!) 116/53   Pulse 73   Temp 98 F (36.7 C) (Oral)   Ht 5\' 4"  (1.626 m)   Wt 149 lb 3.2 oz (67.7 kg)   BMI 25.61 kg/m   General: Well-nourished, well-developed in no acute distress.  Eyes: No icterus. Mouth: Oropharyngeal mucosa moist and pink , no lesions erythema or exudate. Lungs: Clear to auscultation bilaterally.  Heart: Regular rate and rhythm, no murmurs rubs or gallops.  Abdomen: Bowel sounds are normal, nontender, nondistended, no hepatosplenomegaly or masses, no abdominal bruits or hernia , no rebound or guarding.   Extremities: No lower extremity edema. No clubbing or deformities. Neuro: Alert and oriented x 4   Skin: Warm and dry, no jaundice.   Psych: Alert and cooperative, normal mood and affect.  Labs:  Lab Results  Component Value Date   WBC 8.1 02/12/2016   HGB 8.7 (L) 02/12/2016   HCT 28.6 (L) 02/12/2016   MCV 84.6 02/12/2016   PLT 249 02/12/2016   No results found for: IRON, TIBC, FERRITIN No results found for: VITAMINB12 No results found for: FOLATE    Labs requested from PCP, dated 08/04/2016.  Hemoglobin was 8.6, hematocrit 29.4, MCV 84, platelets 238,000, white blood cell count 7400, BUN 13, creatinine 0.76, total bilirubin 0.4, alkaline phosphatase 101, AST 27, ALT 16, albumin 3.9, TSH 1.510.  Imaging Studies: No results found.

## 2016-09-10 ENCOUNTER — Telehealth: Payer: Self-pay | Admitting: Internal Medicine

## 2016-09-10 DIAGNOSIS — Z1211 Encounter for screening for malignant neoplasm of colon: Secondary | ICD-10-CM | POA: Diagnosis not present

## 2016-09-10 NOTE — Telephone Encounter (Signed)
Pt's wife called this morning to say patient was seen here yesterday and he also went to Dr Luan Pulling and did a hemoccult card test. She said that it was positive for blood and Dr Luan Pulling office told her if we needed anything else from them to call their office.

## 2016-09-10 NOTE — Telephone Encounter (Signed)
The labs that were requested yesterday are in [facility] box. I sent another request today for the hemoccult results

## 2016-09-10 NOTE — Assessment & Plan Note (Signed)
76 year old gentleman with history of previous abdominal aortic aneurysm repair who presented last summer with GI bleeding determined to be due to related to mesenteric ischemia. Chronic postprandial abdominal pain weight loss noted. Ultimately underwent aorta mesenteric bypass. Clinically he has been feeling well. No further abdominal pain. Has gained nearly 40 pounds. He continues to be significantly anemic. His hemoglobin is essentially unchanged since his surgery. He is heme positive. No overt GI bleeding. To discuss further with Dr. Gala Romney. Patient had significantly abnormal stomach, terminal ileum, proximal: In the setting of mesenteric ischemia last year. Continues to have chronic anticoagulation. May need further GI evaluation for ongoing anemia. Further recommendations to follow.

## 2016-09-10 NOTE — Telephone Encounter (Signed)
Thank you.  Routing to [facility]

## 2016-09-10 NOTE — Telephone Encounter (Signed)
Steve Gomez, were you able to get the labs from [facility] office?

## 2016-09-13 NOTE — Progress Notes (Signed)
cc'ed to pcp °

## 2016-09-17 NOTE — Progress Notes (Signed)
Received records from PCP. Labs 08/04/16 with Hgb 8.6/Hct 29.4, MCV 84, Platelets 238, WBC 7400.  Heme +X3 09/09/16 and Heme + in 07/2016.  Spoke with Dr. Luan Pulling regarding heme + stools, persistent anemia.  Patient underwent aorta mesenteric bypass by Dr. Donnetta Hutching 01/2016. EGD/TCS prior to that in 12/2015 with evidence of marked inflammatory changes with ulceration involving TI/prox colon/stomach/duodenum.   Patient remains anemic in setting of anticoagulation. To discuss further work up with Dr. Gala Romney.

## 2016-09-22 ENCOUNTER — Ambulatory Visit (INDEPENDENT_AMBULATORY_CARE_PROVIDER_SITE_OTHER): Payer: Medicare HMO | Admitting: *Deleted

## 2016-09-22 DIAGNOSIS — I4891 Unspecified atrial fibrillation: Secondary | ICD-10-CM

## 2016-09-22 DIAGNOSIS — Z5181 Encounter for therapeutic drug level monitoring: Secondary | ICD-10-CM

## 2016-09-22 LAB — POCT INR: INR: 2

## 2016-09-24 NOTE — Telephone Encounter (Signed)
See ov addendum note.

## 2016-09-24 NOTE — Progress Notes (Addendum)
Please let patient know that Dr. Gala Romney recommends EGD to evaluate ongoing anemia, heme + stool. Need to make sure there was no underlying H.pylori or other issues given stomach was markedly abnormal before in setting of mesenteric ischemia.    Please try to get within 30 day cut off.   Please find out from cardiology, [name], if he needs Lovenox bridge or can we hold  coumadin for four days before procedure.   We need to update CBC, iron/tibc, ferritin as well.

## 2016-09-28 ENCOUNTER — Encounter: Payer: Self-pay | Admitting: Internal Medicine

## 2016-09-28 DIAGNOSIS — G4733 Obstructive sleep apnea (adult) (pediatric): Secondary | ICD-10-CM | POA: Diagnosis not present

## 2016-09-29 ENCOUNTER — Telehealth: Payer: Self-pay

## 2016-09-29 NOTE — Telephone Encounter (Signed)
Tried to call with no answer  

## 2016-09-29 NOTE — Telephone Encounter (Signed)
Thank you!  Steve Gomez

## 2016-09-29 NOTE — Telephone Encounter (Signed)
OK to hold coumadin 4 days before EGD.  Resume coumadin night of procedure if OK with MD.

## 2016-09-29 NOTE — Progress Notes (Signed)
I have sent a phone note to Steve Gomez about his coumadin.

## 2016-09-29 NOTE — Telephone Encounter (Signed)
Steve Gomez, this patient was seen in our office and needs an EGD. We need to know if he can hold his coumadin for 4 days prior to his procedure or does he need lovenox?   Thank you!

## 2016-09-30 ENCOUNTER — Other Ambulatory Visit: Payer: Self-pay

## 2016-09-30 ENCOUNTER — Other Ambulatory Visit: Payer: Self-pay | Admitting: Gastroenterology

## 2016-09-30 DIAGNOSIS — D649 Anemia, unspecified: Secondary | ICD-10-CM

## 2016-09-30 NOTE — Progress Notes (Signed)
Tried to call pt- NA-LMOM asking him to go to the lab for the bloodwork.

## 2016-09-30 NOTE — Telephone Encounter (Signed)
Pt is set up for EGD on 10/13/17 @ 1:30 pm. He is aware and instructions are in the mail.

## 2016-09-30 NOTE — Progress Notes (Signed)
Per [name] RN, ok to hold coumadin. See phone note.  Lab orders done

## 2016-10-01 DIAGNOSIS — D649 Anemia, unspecified: Secondary | ICD-10-CM | POA: Diagnosis not present

## 2016-10-01 LAB — CBC WITH DIFFERENTIAL/PLATELET
BASOS PCT: 0 %
Basophils Absolute: 0 cells/uL (ref 0–200)
EOS ABS: 116 {cells}/uL (ref 15–500)
Eosinophils Relative: 2 %
HEMATOCRIT: 34.5 % — AB (ref 38.5–50.0)
HEMOGLOBIN: 10.6 g/dL — AB (ref 13.2–17.1)
LYMPHS PCT: 22 %
Lymphs Abs: 1276 cells/uL (ref 850–3900)
MCH: 28.1 pg (ref 27.0–33.0)
MCHC: 30.7 g/dL — ABNORMAL LOW (ref 32.0–36.0)
MCV: 91.5 fL (ref 80.0–100.0)
MONO ABS: 754 {cells}/uL (ref 200–950)
MPV: 9.1 fL (ref 7.5–12.5)
Monocytes Relative: 13 %
Neutro Abs: 3654 cells/uL (ref 1500–7800)
Neutrophils Relative %: 63 %
Platelets: 184 10*3/uL (ref 140–400)
RBC: 3.77 MIL/uL — ABNORMAL LOW (ref 4.20–5.80)
RDW: 15.6 % — AB (ref 11.0–15.0)
WBC: 5.8 10*3/uL (ref 3.8–10.8)

## 2016-10-01 LAB — IRON AND TIBC
%SAT: 66 % — ABNORMAL HIGH (ref 15–60)
Iron: 212 ug/dL — ABNORMAL HIGH (ref 50–180)
TIBC: 323 ug/dL (ref 250–425)
UIBC: 111 ug/dL — ABNORMAL LOW (ref 125–400)

## 2016-10-02 LAB — FERRITIN: Ferritin: 27 ng/mL (ref 20–380)

## 2016-10-07 NOTE — Progress Notes (Signed)
Pt had labs done and they are in epic.

## 2016-10-07 NOTE — Progress Notes (Signed)
Hgb up to 10.6. Ferritin low, iron up some ?related to oral iron therapy.   EGD as scheduled.   Repeat CBC, iron/tibc, ferritin in four weeks.

## 2016-10-12 DIAGNOSIS — H524 Presbyopia: Secondary | ICD-10-CM | POA: Diagnosis not present

## 2016-10-13 ENCOUNTER — Ambulatory Visit (HOSPITAL_COMMUNITY)
Admission: RE | Admit: 2016-10-13 | Discharge: 2016-10-13 | Disposition: A | Discharge status: Home / Self Care | Payer: Medicare HMO | Source: Ambulatory Visit | Attending: Internal Medicine | Admitting: Internal Medicine

## 2016-10-13 ENCOUNTER — Encounter (HOSPITAL_COMMUNITY)
Admission: RE | Disposition: A | Discharge status: Home / Self Care | Payer: Self-pay | Source: Ambulatory Visit | Attending: Internal Medicine

## 2016-10-13 ENCOUNTER — Telehealth: Payer: Self-pay | Admitting: Internal Medicine

## 2016-10-13 ENCOUNTER — Encounter (HOSPITAL_COMMUNITY): Payer: Self-pay | Admitting: *Deleted

## 2016-10-13 DIAGNOSIS — K3189 Other diseases of stomach and duodenum: Secondary | ICD-10-CM | POA: Insufficient documentation

## 2016-10-13 DIAGNOSIS — K2940 Chronic atrophic gastritis without bleeding: Secondary | ICD-10-CM | POA: Diagnosis not present

## 2016-10-13 DIAGNOSIS — Q2733 Arteriovenous malformation of digestive system vessel: Secondary | ICD-10-CM | POA: Diagnosis not present

## 2016-10-13 DIAGNOSIS — R195 Other fecal abnormalities: Secondary | ICD-10-CM | POA: Diagnosis not present

## 2016-10-13 DIAGNOSIS — K31819 Angiodysplasia of stomach and duodenum without bleeding: Secondary | ICD-10-CM | POA: Insufficient documentation

## 2016-10-13 DIAGNOSIS — D649 Anemia, unspecified: Secondary | ICD-10-CM

## 2016-10-13 DIAGNOSIS — K319 Disease of stomach and duodenum, unspecified: Secondary | ICD-10-CM | POA: Diagnosis not present

## 2016-10-13 HISTORY — DX: Sleep apnea, unspecified: G47.30

## 2016-10-13 HISTORY — PX: ESOPHAGOGASTRODUODENOSCOPY: SHX5428

## 2016-10-13 SURGERY — EGD (ESOPHAGOGASTRODUODENOSCOPY)
Anesthesia: Moderate Sedation

## 2016-10-13 MED ORDER — ONDANSETRON HCL 4 MG/2ML IJ SOLN
INTRAMUSCULAR | Status: DC | PRN
  Administered 2016-10-13: 4 mg via INTRAVENOUS

## 2016-10-13 MED ORDER — ONDANSETRON HCL 4 MG/2ML IJ SOLN
INTRAMUSCULAR | Status: AC
  Filled 2016-10-13: qty 2

## 2016-10-13 MED ORDER — STERILE WATER FOR IRRIGATION IR SOLN
Status: DC | PRN
  Administered 2016-10-13: 2.5 mL

## 2016-10-13 MED ORDER — MIDAZOLAM HCL 5 MG/5ML IJ SOLN
INTRAMUSCULAR | Status: AC
  Filled 2016-10-13: qty 10

## 2016-10-13 MED ORDER — MEPERIDINE HCL 100 MG/ML IJ SOLN
INTRAMUSCULAR | Status: DC | PRN
  Administered 2016-10-13: 50 mg via INTRAVENOUS

## 2016-10-13 MED ORDER — LIDOCAINE VISCOUS 2 % MT SOLN
OROMUCOSAL | Status: AC
  Filled 2016-10-13: qty 15

## 2016-10-13 MED ORDER — LIDOCAINE VISCOUS 2 % MT SOLN
OROMUCOSAL | Status: DC | PRN
  Administered 2016-10-13: 1 via OROMUCOSAL

## 2016-10-13 MED ORDER — SODIUM CHLORIDE 0.9 % IV SOLN
INTRAVENOUS | Status: DC
  Administered 2016-10-13: 1000 mL via INTRAVENOUS

## 2016-10-13 MED ORDER — MEPERIDINE HCL 100 MG/ML IJ SOLN
INTRAMUSCULAR | Status: AC
  Filled 2016-10-13: qty 2

## 2016-10-13 MED ORDER — MIDAZOLAM HCL 5 MG/5ML IJ SOLN
INTRAMUSCULAR | Status: DC | PRN
  Administered 2016-10-13: 1 mg via INTRAVENOUS
  Administered 2016-10-13: 2 mg via INTRAVENOUS

## 2016-10-13 NOTE — H&P (View-Only) (Signed)
Hgb up to 10.6. Ferritin low, iron up some ?related to oral iron therapy.   EGD as scheduled.   Repeat CBC, iron/tibc, ferritin in four weeks.

## 2016-10-13 NOTE — Interval H&P Note (Signed)
History and Physical Interval Note:  10/13/2016 1:19 PM  Steve Gomez  has presented today for surgery, with the diagnosis of ANEMIA  The various methods of treatment have been discussed with the patient and family. After consideration of risks, benefits and other options for treatment, the patient has consented to  Procedure(s) with comments: ESOPHAGOGASTRODUODENOSCOPY (EGD) (N/A) - 130  as a surgical intervention .  The patient's history has been reviewed, patient examined, no change in status, stable for surgery.  I have reviewed the patient's chart and labs.  Questions were answered to the patient's satisfaction.     Steve Gomez  No change; diagnostic EGD per plan. ,.The risks, benefits, limitations, alternatives and imponderables have been reviewed with the patient. Potential for esophageal dilation, biopsy, etc. have also been reviewed.  Questions have been answered. All parties agreeable.

## 2016-10-13 NOTE — Discharge Instructions (Addendum)
EGD Discharge instructions Please read the instructions outlined below and refer to this sheet in the next few weeks. These discharge instructions provide you with general information on caring for yourself after you leave the hospital. Your doctor may also give you specific instructions. While your treatment has been planned according to the most current medical practices available, unavoidable complications occasionally occur. If you have any problems or questions after discharge, please call your doctor. ACTIVITY  You may resume your regular activity but move at a slower pace for the next 24 hours.   Take frequent rest periods for the next 24 hours.   Walking will help expel (get rid of) the air and reduce the bloated feeling in your abdomen.   No driving for 24 hours (because of the anesthesia (medicine) used during the test).   You may shower.   Do not sign any important legal documents or operate any machinery for 24 hours (because of the anesthesia used during the test).  NUTRITION  Drink plenty of fluids.   You may resume your normal diet.   Begin with a light meal and progress to your normal diet.   Avoid alcoholic beverages for 24 hours or as instructed by your caregiver.  MEDICATIONS  You may resume your normal medications unless your caregiver tells you otherwise.  WHAT YOU CAN EXPECT TODAY  You may experience abdominal discomfort such as a feeling of fullness or gas pains.  FOLLOW-UP  Your doctor will discuss the results of your test with you.  SEEK IMMEDIATE MEDICAL ATTENTION IF ANY OF THE FOLLOWING OCCUR:  Excessive nausea (feeling sick to your stomach) and/or vomiting.   Severe abdominal pain and distention (swelling).   Trouble swallowing.   Temperature over 101 F (37.8 C).   Rectal bleeding or vomiting of blood.    Resume Coumadin tomorrow  Further recommendations to follow pending review of pathology report.  CBC and office visit with [place] in 6  weeks  No future  MRI until clips gone

## 2016-10-13 NOTE — Op Note (Signed)
[facility] Patient Name: Steve Gomez Procedure Date: 10/13/2016 1:20 PM MRN: [id number] Date of Birth: [birth year] Attending MD: Norvel Richards , MD CSN: [id number] Age: 76 Admit Type: Outpatient Procedure:                Upper GI endoscopy ith gastric biopsy with AVM                            ablatio / clip placement Indications:              Occult blood in stool Providers:                Norvel Richards, MD, Lurline Del, RN, Purcell Nails.                            [place], [employer] Referring MD:              Medicines:                Midazolam 3 mg IV, Meperidine 100 mg IV,                            Ondansetron 4 mg IV Complications:            No immediate complications. Estimated Blood Loss:     Estimated blood loss was minimal. Procedure:                Pre-Anesthesia Assessment:                           - Prior to the procedure, a History and Physical                            was performed, and patient medications and                            allergies were reviewed. The patient's tolerance of                            previous anesthesia was also reviewed. The risks                            and benefits of the procedure and the sedation                            options and risks were discussed with the patient.                            All questions were answered, and informed consent                            was obtained. Prior Anticoagulants: The patient                            last took Coumadin (warfarin) 4 days prior to the  procedure. ASA Grade Assessment: III - A patient                            with severe systemic disease. After reviewing the                            risks and benefits, the patient was deemed in                            satisfactory condition to undergo the procedure.                           After obtaining informed consent, the endoscope was                            passed under  direct vision. Throughout the                            procedure, the patient's blood pressure, pulse, and                            oxygen saturations were monitored continuously. The                            [id number]) scope was introduced through the                            mouth, and advanced to the second part of duodenum.                            The upper GI endoscopy was accomplished without                            difficulty. The patient tolerated the procedure                            well. Scope In: 1:29:10 PM Scope Out: 1:42:24 PM Total Procedure Duration: 0 hours 13 minutes 14 seconds  Findings:      Esophagus appeared normal.      Congested mucosa was found diffusely. Stomach looked much better than       seen previously. There was an 8 mm AVM on posterior gastric wall. Small       hiatal hernia. Patent pylorus.      The duodenal bulb and second portion of the duodenum were normal. This       was biopsied with a cold forceps for histology. Estimated blood loss was       minimal. AVM ablated with the multiple applications of the APC at 20 J       each. Circumferential probe. 2 hemostasis clips placed. Impression:               - Congestive gastropathy. AVM?"ablated. Hemostasis                            clips placed. Gastric biopsy.                           -  Normal duodenal bulb and second portion of the                            duodenum. . Moderate Sedation:      Moderate (conscious) sedation was administered by the endoscopy nurse       and supervised by the endoscopist. The following parameters were       monitored: oxygen saturation, heart rate, blood pressure, respiratory       rate, EKG, adequacy of pulmonary ventilation, and response to care.       Total physician intraservice time was 17 minutes. Recommendation:           - Patient has a contact number available for                            emergencies. The signs and symptoms of  potential                            delayed complications were discussed with the                            patient. Return to normal activities tomorrow.                            Written discharge instructions were provided to the                            patient.                           - Resume previous diet.                           - Continue present medications.                           - Resume Coumadin (warfarin) at prior dose tomorrow.                           - Return to GI office in 6 weeks. No future MRI                            until clips gone. Procedure Code(s):        --- Professional ---                           [phone], Esophagogastroduodenoscopy, flexible,                            transoral; with biopsy, single or multiple Diagnosis Code(s):        --- Professional ---                           K31.89, Other diseases of stomach and duodenum                           R19.5,  Other fecal abnormalities CPT copyright 2016 American Medical Association. All rights reserved. The codes documented in this report are preliminary and upon coder review may  be revised to meet current compliance requirements. [name]. [name], MD Norvel Richards, MD 10/13/2016 1:54:03 PM This report has been signed electronically. Number of Addenda: 0

## 2016-10-13 NOTE — Telephone Encounter (Signed)
Pt is scheduled to follow up with [place] in 6 weeks and needs a CBC

## 2016-10-14 ENCOUNTER — Other Ambulatory Visit: Payer: Self-pay | Admitting: Gastroenterology

## 2016-10-14 DIAGNOSIS — D649 Anemia, unspecified: Secondary | ICD-10-CM

## 2016-10-14 NOTE — Telephone Encounter (Signed)
Per [facility] orders- pt needs cbc, iron, tibc and ferritin. Lab orders on file.

## 2016-10-15 ENCOUNTER — Encounter: Payer: Self-pay | Admitting: Internal Medicine

## 2016-10-18 ENCOUNTER — Telehealth: Payer: Self-pay | Admitting: *Deleted

## 2016-10-18 NOTE — Telephone Encounter (Signed)
Please give pt's wife a call @ [phone]

## 2016-10-18 NOTE — Telephone Encounter (Signed)
Pt has colonoscopy.  Took last dose of coumadin 2/9 and restarted on 2/15.  Has INR appt on 2/21.  Wife wants to know if pt should keep appt.  Told her to have pt take coumadin 3mg  tonight and tomorrow night and keep INR appt.  She verbalized understanding.

## 2016-10-20 ENCOUNTER — Ambulatory Visit (INDEPENDENT_AMBULATORY_CARE_PROVIDER_SITE_OTHER): Payer: Medicare HMO | Admitting: *Deleted

## 2016-10-20 DIAGNOSIS — Z5181 Encounter for therapeutic drug level monitoring: Secondary | ICD-10-CM | POA: Diagnosis not present

## 2016-10-20 DIAGNOSIS — I4891 Unspecified atrial fibrillation: Secondary | ICD-10-CM

## 2016-10-20 LAB — POCT INR: INR: 1.4

## 2016-10-27 DIAGNOSIS — G4733 Obstructive sleep apnea (adult) (pediatric): Secondary | ICD-10-CM | POA: Diagnosis not present

## 2016-11-01 ENCOUNTER — Other Ambulatory Visit: Payer: Self-pay

## 2016-11-01 DIAGNOSIS — D649 Anemia, unspecified: Secondary | ICD-10-CM

## 2016-11-03 ENCOUNTER — Ambulatory Visit (INDEPENDENT_AMBULATORY_CARE_PROVIDER_SITE_OTHER): Payer: Medicare HMO | Admitting: *Deleted

## 2016-11-03 DIAGNOSIS — I4891 Unspecified atrial fibrillation: Secondary | ICD-10-CM

## 2016-11-03 DIAGNOSIS — Z5181 Encounter for therapeutic drug level monitoring: Secondary | ICD-10-CM | POA: Diagnosis not present

## 2016-11-03 LAB — POCT INR: INR: 2.8

## 2016-11-11 ENCOUNTER — Encounter (HOSPITAL_COMMUNITY): Payer: Self-pay | Admitting: Internal Medicine

## 2016-11-11 DIAGNOSIS — D649 Anemia, unspecified: Secondary | ICD-10-CM | POA: Diagnosis not present

## 2016-11-11 LAB — CBC WITH DIFFERENTIAL/PLATELET
Basophils Absolute: 0 cells/uL (ref 0–200)
Basophils Relative: 0 %
EOS PCT: 2 %
Eosinophils Absolute: 124 cells/uL (ref 15–500)
HEMATOCRIT: 37.2 % — AB (ref 38.5–50.0)
Hemoglobin: 11.9 g/dL — ABNORMAL LOW (ref 13.2–17.1)
LYMPHS ABS: 1426 {cells}/uL (ref 850–3900)
LYMPHS PCT: 23 %
MCH: 29 pg (ref 27.0–33.0)
MCHC: 32 g/dL (ref 32.0–36.0)
MCV: 90.7 fL (ref 80.0–100.0)
MONO ABS: 868 {cells}/uL (ref 200–950)
MPV: 10 fL (ref 7.5–12.5)
Monocytes Relative: 14 %
NEUTROS PCT: 61 %
Neutro Abs: 3782 cells/uL (ref 1500–7800)
Platelets: 191 10*3/uL (ref 140–400)
RBC: 4.1 MIL/uL — AB (ref 4.20–5.80)
RDW: 14.1 % (ref 11.0–15.0)
WBC: 6.2 10*3/uL (ref 3.8–10.8)

## 2016-11-11 LAB — IRON AND TIBC
%SAT: 39 % (ref 15–60)
IRON: 132 ug/dL (ref 50–180)
TIBC: 342 ug/dL (ref 250–425)
UIBC: 210 ug/dL (ref 125–400)

## 2016-11-11 LAB — FERRITIN: Ferritin: 36 ng/mL (ref 20–380)

## 2016-11-17 NOTE — Progress Notes (Signed)
Please let pt know his H/H is improving but not quite normal. Iron stores are low normal.  Continue oral iron.  Repeat cbc, ferritin in 3 months.  Keep ov as planned.

## 2016-11-23 ENCOUNTER — Other Ambulatory Visit: Payer: Self-pay | Admitting: Gastroenterology

## 2016-11-23 DIAGNOSIS — D649 Anemia, unspecified: Secondary | ICD-10-CM

## 2016-11-24 ENCOUNTER — Encounter: Payer: Self-pay | Admitting: Gastroenterology

## 2016-11-24 ENCOUNTER — Ambulatory Visit (INDEPENDENT_AMBULATORY_CARE_PROVIDER_SITE_OTHER): Payer: Medicare HMO | Admitting: Gastroenterology

## 2016-11-24 VITALS — BP 115/65 | HR 73 | Temp 98.0°F | Ht 64.0 in | Wt 150.6 lb

## 2016-11-24 DIAGNOSIS — I4891 Unspecified atrial fibrillation: Secondary | ICD-10-CM | POA: Diagnosis not present

## 2016-11-24 DIAGNOSIS — D500 Iron deficiency anemia secondary to blood loss (chronic): Secondary | ICD-10-CM | POA: Diagnosis not present

## 2016-11-24 DIAGNOSIS — I10 Essential (primary) hypertension: Secondary | ICD-10-CM | POA: Diagnosis not present

## 2016-11-24 DIAGNOSIS — K559 Vascular disorder of intestine, unspecified: Secondary | ICD-10-CM | POA: Diagnosis not present

## 2016-11-24 DIAGNOSIS — J449 Chronic obstructive pulmonary disease, unspecified: Secondary | ICD-10-CM | POA: Diagnosis not present

## 2016-11-24 DIAGNOSIS — I25119 Atherosclerotic heart disease of native coronary artery with unspecified angina pectoris: Secondary | ICD-10-CM | POA: Diagnosis not present

## 2016-11-24 NOTE — Progress Notes (Addendum)
Primary Care Physician: Alonza Bogus, MD  Primary Gastroenterologist:  Garfield Cornea, MD   Chief Complaint  Patient presents with  . Anemia    f/u, doing ok    HPI: Steve Gomez is a 76 y.o. male here for follow-up. He has a history of GI bleeding, mesenteric ischemia, anemia. He is chronically anticoagulated due to A. fib. Ultimately he was determined that he had significant mesenteric ischemia. EGD and colonoscopy during his workup last year showed markedly abnormal stomach and duodenum and marked inflammatory changes with ulceration involving most of the proximal colon extending up into the terminal ileum all which-concern for gut ischemia. Patient ultimately transferred to [facility] and underwent aorta mesenteric bypass by Dr. Curt Jews.   He was seen in follow-up in January. Clinically doing well. Postprandial abdominal pain resolved after surgery. No nausea or vomiting. Bowel movements regular. Denies melena rectal bleeding. Denies heartburn. Gained 50 pounds since his surgery. Weight now stable.  Recent EGD done because of anemia, heme positive stool. Congested mucosa found diffusely, stomach looked better than previously however. 47mm AVM on the posterior gastric wall status post ablation, small hiatal hernia.   Gastric biopsies with no evidence of H. pylori. Recent labs 2 weeks ago showed hemoglobin improved at 11.9, iron normal at 132, iron saturations 39%, TIBC 342, ferritin 36. Patient advised to continue oral iron once daily. We will repeat his labs in 3 months.  Current Outpatient Prescriptions  Medication Sig Dispense Refill  . acetaminophen (TYLENOL) 500 MG tablet Take 500 mg by mouth every 6 (six) hours as needed for mild pain or moderate pain.    Marland Kitchen amiodarone (PACERONE) 200 MG tablet Take 1 tablet (200 mg total) by mouth daily. 90 tablet 3  . atorvastatin (LIPITOR) 20 MG tablet Take 20 mg by mouth at bedtime.     . chlorthalidone (HYGROTON) 25 MG tablet Take  25 mg by mouth daily.    . famotidine (PEPCID) 20 MG tablet Take 1 tablet (20 mg total) by mouth daily. 30 tablet 0  . ferrous sulfate 325 (65 FE) MG tablet Take 325 mg by mouth daily with breakfast.    . Multiple Vitamin (MULTIVITAMIN) capsule Take 1 capsule by mouth daily.    . OXYGEN Inhale 2 L into the lungs at bedtime.    . tamsulosin (FLOMAX) 0.4 MG CAPS capsule Take 1 capsule (0.4 mg total) by mouth daily. 30 capsule 0  . warfarin (COUMADIN) 3 MG tablet Take 1 tablet daily except 1/2 tablet on Sundays and Thursdays (Patient taking differently: Take 1 tablet daily except 1/2 tablet on Sundays,Tuesdays,and Thursdays) 90 tablet 1   No current facility-administered medications for this visit.     Allergies as of 11/24/2016  . (No Known Allergies)    ROS:  General: Negative for anorexia, weight loss, fever, chills, fatigue, weakness. ENT: Negative for hoarseness, difficulty swallowing , nasal congestion. CV: Negative for chest pain, angina, palpitations, dyspnea on exertion, peripheral edema.  Respiratory: Negative for dyspnea at rest, dyspnea on exertion, cough, sputum, wheezing.  GI: See history of present illness. GU:  Negative for dysuria, hematuria, urinary incontinence, urinary frequency, nocturnal urination.  Endo: Negative for unusual weight change.    Physical Examination:   BP 115/65   Pulse 73   Temp 98 F (36.7 C) (Oral)   Ht 5\' 4"  (1.626 m)   Wt 150 lb 9.6 oz (68.3 kg)   BMI 25.85 kg/m   General: Well-nourished, well-developed in no acute  distress.  Eyes: No icterus. Mouth: Oropharyngeal mucosa moist and pink , no lesions erythema or exudate. Lungs: Clear to auscultation bilaterally.  Heart: Regular rate and rhythm, no murmurs rubs or gallops.  Abdomen: Bowel sounds are normal, nontender, nondistended, no hepatosplenomegaly or masses, no abdominal bruits or hernia , no rebound or guarding.   Extremities: No lower extremity edema. No clubbing or  deformities. Neuro: Alert and oriented x 4   Skin: Warm and dry, no jaundice.   Psych: Alert and cooperative, normal mood and affect.  Labs:  Lab Results  Component Value Date   WBC 6.2 11/11/2016   HGB 11.9 (L) 11/11/2016   HCT 37.2 (L) 11/11/2016   MCV 90.7 11/11/2016   PLT 191 11/11/2016   Lab Results  Component Value Date   IRON 132 11/11/2016   TIBC 342 11/11/2016   FERRITIN 36 11/11/2016    Imaging Studies: No results found.

## 2016-11-24 NOTE — Patient Instructions (Signed)
1. Continue iron once daily. We will recheck your labs in a couple of months.  2. Call if you notice black tarry stools, fresh blood in your stools, abdominal pain, weight loss, lightheadedness, fatigue.

## 2016-11-24 NOTE — Progress Notes (Signed)
cc'ed to pcp °

## 2016-11-24 NOTE — Assessment & Plan Note (Signed)
Clinically doing well. Hemoglobin near normal. Recent EGD with diffuse gastric congestion, mild inflammation on biopsy but no H. pylori. Gastric AVM ablated. Follows up with his vascular surgeon in May. Plans to recheck hemoglobin, ferritin in 3 months. Warning signs for overt GI bleeding, progressive anemia explained to patient. He'll let us know if he develops any abdominal pain, melena, rectal bleeding, lightheadedness, fatigue.  Await labs in 3 months, further recommendations to follow.

## 2016-11-26 DIAGNOSIS — G4733 Obstructive sleep apnea (adult) (pediatric): Secondary | ICD-10-CM | POA: Diagnosis not present

## 2016-12-01 ENCOUNTER — Ambulatory Visit (INDEPENDENT_AMBULATORY_CARE_PROVIDER_SITE_OTHER): Payer: Medicare HMO | Admitting: *Deleted

## 2016-12-01 DIAGNOSIS — Z5181 Encounter for therapeutic drug level monitoring: Secondary | ICD-10-CM

## 2016-12-01 DIAGNOSIS — I4891 Unspecified atrial fibrillation: Secondary | ICD-10-CM | POA: Diagnosis not present

## 2016-12-01 LAB — POCT INR: INR: 3.4

## 2016-12-14 DIAGNOSIS — G4733 Obstructive sleep apnea (adult) (pediatric): Secondary | ICD-10-CM | POA: Diagnosis not present

## 2016-12-27 ENCOUNTER — Ambulatory Visit (INDEPENDENT_AMBULATORY_CARE_PROVIDER_SITE_OTHER): Payer: Medicare HMO | Admitting: *Deleted

## 2016-12-27 DIAGNOSIS — Z5181 Encounter for therapeutic drug level monitoring: Secondary | ICD-10-CM

## 2016-12-27 DIAGNOSIS — G4733 Obstructive sleep apnea (adult) (pediatric): Secondary | ICD-10-CM | POA: Diagnosis not present

## 2016-12-27 DIAGNOSIS — I4891 Unspecified atrial fibrillation: Secondary | ICD-10-CM

## 2016-12-27 LAB — POCT INR: INR: 3.6

## 2017-01-12 ENCOUNTER — Other Ambulatory Visit: Payer: Self-pay | Admitting: Adult Health

## 2017-01-14 ENCOUNTER — Encounter: Payer: Self-pay | Admitting: Vascular Surgery

## 2017-01-17 ENCOUNTER — Ambulatory Visit (INDEPENDENT_AMBULATORY_CARE_PROVIDER_SITE_OTHER): Payer: Medicare HMO | Admitting: *Deleted

## 2017-01-17 ENCOUNTER — Other Ambulatory Visit: Payer: Self-pay

## 2017-01-17 DIAGNOSIS — I4891 Unspecified atrial fibrillation: Secondary | ICD-10-CM | POA: Diagnosis not present

## 2017-01-17 DIAGNOSIS — Z5181 Encounter for therapeutic drug level monitoring: Secondary | ICD-10-CM | POA: Diagnosis not present

## 2017-01-17 DIAGNOSIS — D649 Anemia, unspecified: Secondary | ICD-10-CM

## 2017-01-17 LAB — POCT INR: INR: 2

## 2017-01-25 ENCOUNTER — Ambulatory Visit (INDEPENDENT_AMBULATORY_CARE_PROVIDER_SITE_OTHER): Payer: Medicare HMO | Admitting: Vascular Surgery

## 2017-01-25 ENCOUNTER — Ambulatory Visit (HOSPITAL_COMMUNITY)
Admission: RE | Admit: 2017-01-25 | Discharge: 2017-01-25 | Disposition: A | Discharge status: Home / Self Care | Payer: Medicare HMO | Source: Ambulatory Visit | Attending: Vascular Surgery | Admitting: Vascular Surgery

## 2017-01-25 ENCOUNTER — Encounter: Payer: Self-pay | Admitting: Vascular Surgery

## 2017-01-25 VITALS — BP 144/78 | HR 62 | Temp 97.5°F | Resp 16 | Ht 64.0 in | Wt 150.0 lb

## 2017-01-25 DIAGNOSIS — K551 Chronic vascular disorders of intestine: Secondary | ICD-10-CM | POA: Insufficient documentation

## 2017-01-25 DIAGNOSIS — R109 Unspecified abdominal pain: Secondary | ICD-10-CM | POA: Insufficient documentation

## 2017-01-25 NOTE — Progress Notes (Signed)
Vascular and Vein Specialist of [facility]  Patient name: Steve Gomez MRN: [id number] DOB: 08/22/1941 Sex: male  REASON FOR VISIT: Follow-up aorta mesenteric bypass for mesenteric ischemia.  HPI: Steve Gomez is a 76 y.o. male here today for follow-up of urgent aorta mesenteric bypass June 2017. He had progressive chronic mesenteric ischemia noted lost weight down to 110 pounds. He reports that even if he saw food on TV made him sick to his stomach. He had uneventful recovery from his surgery one year ago. He has having no difficulty with eating and has actually gained weight back to 150 pounds.  Past Medical History:  Diagnosis Date  . Atherosclerosis   . Bladder outlet obstruction 01/14/2016  . COPD (chronic obstructive pulmonary disease) ([place])   . High cholesterol   . Hypertension   . New onset atrial fibrillation ([place])   . Renal calculi 01/14/2016  . S/P AAA repair   . Sleep apnea   . Tobacco abuse     Family History  Problem Relation Age of Onset  . Suicidality Father        Deceased   . Diabetes Brother   . Diabetes Brother   . Diabetes Brother     SOCIAL HISTORY: Social History  Substance Use Topics  . Smoking status: Former Smoker    Packs/day: 1.00    Types: Cigarettes    Start date: 08/30/1960    Quit date: 01/28/2016  . Smokeless tobacco: Never Used  . Alcohol use 0.0 oz/week     Comment: "just a couple of swallows of wine every night"    No Known Allergies  Current Outpatient Prescriptions  Medication Sig Dispense Refill  . acetaminophen (TYLENOL) 500 MG tablet Take 500 mg by mouth every 6 (six) hours as needed for mild pain or moderate pain.    Marland Kitchen amiodarone (PACERONE) 200 MG tablet TAKE 1 TABLET EVERY DAY 90 tablet 3  . atorvastatin (LIPITOR) 20 MG tablet Take 20 mg by mouth at bedtime.     . chlorthalidone (HYGROTON) 25 MG tablet Take 25 mg by mouth daily.    . famotidine (PEPCID) 20 MG tablet Take 1 tablet (20  mg total) by mouth daily. 30 tablet 0  . ferrous sulfate 325 (65 FE) MG tablet Take 325 mg by mouth daily with breakfast.    . Multiple Vitamin (MULTIVITAMIN) capsule Take 1 capsule by mouth daily.    . OXYGEN Inhale 2 L into the lungs at bedtime.    . tamsulosin (FLOMAX) 0.4 MG CAPS capsule Take 1 capsule (0.4 mg total) by mouth daily. 30 capsule 0  . warfarin (COUMADIN) 3 MG tablet Take 1 tablet daily except 1/2 tablet on Sundays and Thursdays (Patient taking differently: 1.5 mg daily. 1.2 tab daily, 1 whole tab (3mg ) on Monday and Friday.) 90 tablet 1   No current facility-administered medications for this visit.     REVIEW OF SYSTEMS:  [X]  denotes positive finding, [ ]  denotes negative finding Cardiac  Comments:  Chest pain or chest pressure:    Shortness of breath upon exertion:    Short of breath when lying flat:    Irregular heart rhythm:        Vascular    Pain in calf, thigh, or hip brought on by ambulation:    Pain in feet at night that wakes you up from your sleep:     Blood clot in your veins:    Leg swelling:  PHYSICAL EXAM: Vitals:   01/25/17 0912  BP: (!) 144/78  Pulse: 62  Resp: 16  Temp: 97.5 F (36.4 C)  TempSrc: Oral  SpO2: 93%  Weight: 150 lb (68 kg)  Height: 5\' 4"  (1.626 m)    GENERAL: The patient is a well-nourished male, in no acute distress. The vital signs are documented above. CARDIOVASCULAR: 2+ radial 2+ dorsalis pedis pulses bilaterally Abdomen soft. Well-healed midline incision with no evidence of hernia PULMONARY: There is good air exchange  MUSCULOSKELETAL: There are no major deformities or cyanosis. NEUROLOGIC: No focal weakness or paresthesias are detected. SKIN: There are no ulcers or rashes noted. PSYCHIATRIC: The patient has a normal affect.  DATA:  Duplex today shows patency of aorta mesenteric bypass. Full visualization is somewhat limited due to overlying bowel gas.  MEDICAL ISSUES: Stable status post urgent aorta  mesenteric bypass for mesenteric ischemia 1 year ago. He is gained from 110 up to 150 pounds. He is eating without difficulty. He will continue his usual activities. He will notify should he develop any new difficulties otherwise will see [place] on an as-needed basis    [name], MD [facility] Vascular and Vein Specialists of [facility] Tel [phone] Pager [phone]

## 2017-01-26 DIAGNOSIS — G4733 Obstructive sleep apnea (adult) (pediatric): Secondary | ICD-10-CM | POA: Diagnosis not present

## 2017-01-27 ENCOUNTER — Encounter: Payer: Self-pay | Admitting: *Deleted

## 2017-02-23 ENCOUNTER — Ambulatory Visit (INDEPENDENT_AMBULATORY_CARE_PROVIDER_SITE_OTHER): Payer: Medicare HMO | Admitting: *Deleted

## 2017-02-23 ENCOUNTER — Other Ambulatory Visit: Payer: Self-pay | Admitting: Gastroenterology

## 2017-02-23 DIAGNOSIS — I4891 Unspecified atrial fibrillation: Secondary | ICD-10-CM | POA: Diagnosis not present

## 2017-02-23 DIAGNOSIS — Z5181 Encounter for therapeutic drug level monitoring: Secondary | ICD-10-CM

## 2017-02-23 DIAGNOSIS — D649 Anemia, unspecified: Secondary | ICD-10-CM | POA: Diagnosis not present

## 2017-02-23 LAB — POCT INR: INR: 2.3

## 2017-02-24 ENCOUNTER — Telehealth: Payer: Self-pay

## 2017-02-24 DIAGNOSIS — I10 Essential (primary) hypertension: Secondary | ICD-10-CM | POA: Diagnosis not present

## 2017-02-24 DIAGNOSIS — J449 Chronic obstructive pulmonary disease, unspecified: Secondary | ICD-10-CM | POA: Diagnosis not present

## 2017-02-24 DIAGNOSIS — I4891 Unspecified atrial fibrillation: Secondary | ICD-10-CM | POA: Diagnosis not present

## 2017-02-24 DIAGNOSIS — I25119 Atherosclerotic heart disease of native coronary artery with unspecified angina pectoris: Secondary | ICD-10-CM | POA: Diagnosis not present

## 2017-02-24 LAB — CBC/DIFF AMBIGUOUS DEFAULT
BASOS ABS: 0 10*3/uL (ref 0.0–0.2)
Basos: 0 %
EOS (ABSOLUTE): 0.1 10*3/uL (ref 0.0–0.4)
EOS: 2 %
HEMATOCRIT: 44.8 % (ref 37.5–51.0)
HEMOGLOBIN: 14.5 g/dL (ref 13.0–17.7)
IMMATURE GRANULOCYTES: 0 %
Immature Grans (Abs): 0 10*3/uL (ref 0.0–0.1)
Lymphocytes Absolute: 1.6 10*3/uL (ref 0.7–3.1)
Lymphs: 25 %
MCH: 30 pg (ref 26.6–33.0)
MCHC: 32.4 g/dL (ref 31.5–35.7)
MCV: 93 fL (ref 79–97)
MONOCYTES: 12 %
MONOS ABS: 0.8 10*3/uL (ref 0.1–0.9)
NEUTROS PCT: 61 %
Neutrophils Absolute: 3.8 10*3/uL (ref 1.4–7.0)
Platelets: 160 10*3/uL (ref 150–379)
RBC: 4.83 x10E6/uL (ref 4.14–5.80)
RDW: 13.4 % (ref 12.3–15.4)
WBC: 6.2 10*3/uL (ref 3.4–10.8)

## 2017-02-24 LAB — FERRITIN: FERRITIN: 67 ng/mL (ref 30–400)

## 2017-02-24 NOTE — Telephone Encounter (Addendum)
Labs are back from [facility] and are in [facility]SL box 

## 2017-02-25 NOTE — Telephone Encounter (Signed)
Noted  

## 2017-02-25 NOTE — Progress Notes (Signed)
Please let patient know his Hgb and ferritin are NORMAL.  I would suggest staying on iron at least 3 times per week.  Recheck CBC, ferritin in six months. Please have ov with rmr to follow.

## 2017-02-26 DIAGNOSIS — G4733 Obstructive sleep apnea (adult) (pediatric): Secondary | ICD-10-CM | POA: Diagnosis not present

## 2017-02-28 ENCOUNTER — Other Ambulatory Visit: Payer: Self-pay | Admitting: Gastroenterology

## 2017-02-28 DIAGNOSIS — D500 Iron deficiency anemia secondary to blood loss (chronic): Secondary | ICD-10-CM

## 2017-02-28 NOTE — Progress Notes (Signed)
ON RECALL  °

## 2017-03-14 ENCOUNTER — Telehealth: Payer: Self-pay | Admitting: *Deleted

## 2017-03-14 NOTE — Telephone Encounter (Signed)
Pt forgot to take his meds Saturday night and his wife didn't realize it till Sunday morning, please give her a call as to what she needs to do [phone]

## 2017-03-14 NOTE — Telephone Encounter (Signed)
Spoke with wife.  Told her to have pt take coumadin 4.5mg  tonight then resume 1.5mg  daily except 3mg  on Mondays and Fridays.  She verbalized understanding.  Pt has INR appt. Scheduled for 03/21/17.

## 2017-03-21 ENCOUNTER — Ambulatory Visit (INDEPENDENT_AMBULATORY_CARE_PROVIDER_SITE_OTHER): Payer: Medicare HMO | Admitting: *Deleted

## 2017-03-21 DIAGNOSIS — I4891 Unspecified atrial fibrillation: Secondary | ICD-10-CM

## 2017-03-21 DIAGNOSIS — Z5181 Encounter for therapeutic drug level monitoring: Secondary | ICD-10-CM | POA: Diagnosis not present

## 2017-03-21 LAB — POCT INR: INR: 2.2

## 2017-03-28 DIAGNOSIS — G4733 Obstructive sleep apnea (adult) (pediatric): Secondary | ICD-10-CM | POA: Diagnosis not present

## 2017-04-19 DIAGNOSIS — G4733 Obstructive sleep apnea (adult) (pediatric): Secondary | ICD-10-CM | POA: Diagnosis not present

## 2017-04-25 ENCOUNTER — Ambulatory Visit (INDEPENDENT_AMBULATORY_CARE_PROVIDER_SITE_OTHER): Payer: Medicare HMO | Admitting: *Deleted

## 2017-04-25 DIAGNOSIS — I4891 Unspecified atrial fibrillation: Secondary | ICD-10-CM | POA: Diagnosis not present

## 2017-04-25 DIAGNOSIS — Z5181 Encounter for therapeutic drug level monitoring: Secondary | ICD-10-CM

## 2017-04-25 LAB — POCT INR: INR: 2

## 2017-04-28 DIAGNOSIS — G4733 Obstructive sleep apnea (adult) (pediatric): Secondary | ICD-10-CM | POA: Diagnosis not present

## 2017-05-29 DIAGNOSIS — G4733 Obstructive sleep apnea (adult) (pediatric): Secondary | ICD-10-CM | POA: Diagnosis not present

## 2017-05-30 ENCOUNTER — Telehealth: Payer: Self-pay | Admitting: Cardiology

## 2017-05-30 ENCOUNTER — Ambulatory Visit (INDEPENDENT_AMBULATORY_CARE_PROVIDER_SITE_OTHER): Payer: Medicare HMO | Admitting: *Deleted

## 2017-05-30 DIAGNOSIS — Z5181 Encounter for therapeutic drug level monitoring: Secondary | ICD-10-CM | POA: Diagnosis not present

## 2017-05-30 DIAGNOSIS — I4891 Unspecified atrial fibrillation: Secondary | ICD-10-CM

## 2017-05-30 LAB — POCT INR: INR: 2.1

## 2017-06-09 DIAGNOSIS — F17200 Nicotine dependence, unspecified, uncomplicated: Secondary | ICD-10-CM | POA: Diagnosis not present

## 2017-06-09 DIAGNOSIS — R739 Hyperglycemia, unspecified: Secondary | ICD-10-CM | POA: Diagnosis not present

## 2017-06-09 DIAGNOSIS — I10 Essential (primary) hypertension: Secondary | ICD-10-CM | POA: Diagnosis not present

## 2017-06-09 DIAGNOSIS — R634 Abnormal weight loss: Secondary | ICD-10-CM | POA: Diagnosis not present

## 2017-06-09 DIAGNOSIS — I4891 Unspecified atrial fibrillation: Secondary | ICD-10-CM | POA: Diagnosis not present

## 2017-06-09 DIAGNOSIS — Z0000 Encounter for general adult medical examination without abnormal findings: Secondary | ICD-10-CM | POA: Diagnosis not present

## 2017-06-09 DIAGNOSIS — R911 Solitary pulmonary nodule: Secondary | ICD-10-CM | POA: Diagnosis not present

## 2017-06-09 DIAGNOSIS — G4733 Obstructive sleep apnea (adult) (pediatric): Secondary | ICD-10-CM | POA: Diagnosis not present

## 2017-06-09 DIAGNOSIS — I25119 Atherosclerotic heart disease of native coronary artery with unspecified angina pectoris: Secondary | ICD-10-CM | POA: Diagnosis not present

## 2017-06-09 DIAGNOSIS — J449 Chronic obstructive pulmonary disease, unspecified: Secondary | ICD-10-CM | POA: Diagnosis not present

## 2017-06-09 DIAGNOSIS — Z23 Encounter for immunization: Secondary | ICD-10-CM | POA: Diagnosis not present

## 2017-06-28 DIAGNOSIS — G4733 Obstructive sleep apnea (adult) (pediatric): Secondary | ICD-10-CM | POA: Diagnosis not present

## 2017-07-11 ENCOUNTER — Ambulatory Visit (INDEPENDENT_AMBULATORY_CARE_PROVIDER_SITE_OTHER): Payer: Medicare HMO | Admitting: *Deleted

## 2017-07-11 DIAGNOSIS — Z5181 Encounter for therapeutic drug level monitoring: Secondary | ICD-10-CM

## 2017-07-11 DIAGNOSIS — I4891 Unspecified atrial fibrillation: Secondary | ICD-10-CM

## 2017-07-11 LAB — POCT INR: INR: 2

## 2017-07-11 MED ORDER — WARFARIN SODIUM 3 MG PO TABS: ORAL_TABLET | ORAL | 1 refills | Status: DC

## 2017-07-15 ENCOUNTER — Encounter: Payer: Self-pay | Admitting: Internal Medicine

## 2017-07-29 DIAGNOSIS — G4733 Obstructive sleep apnea (adult) (pediatric): Secondary | ICD-10-CM | POA: Diagnosis not present

## 2017-08-04 ENCOUNTER — Other Ambulatory Visit: Payer: Self-pay

## 2017-08-04 DIAGNOSIS — D500 Iron deficiency anemia secondary to blood loss (chronic): Secondary | ICD-10-CM

## 2017-08-05 ENCOUNTER — Telehealth: Payer: Self-pay | Admitting: Internal Medicine

## 2017-08-05 NOTE — Telephone Encounter (Signed)
Pt is aware of OV in Feb and wants [place] to send the lab orders to [facility]

## 2017-08-05 NOTE — Telephone Encounter (Signed)
Pt's lab orders are for Steve Gomez and informed his wife.

## 2017-08-16 ENCOUNTER — Telehealth: Payer: Self-pay | Admitting: Internal Medicine

## 2017-08-16 NOTE — Telephone Encounter (Signed)
Called spoke with pt and he is aware orders were mailed end of last week. Aware if he does not receive them by the end of week to call us back. Also made aware he is not due for the labs until Jan 2.

## 2017-08-16 NOTE — Telephone Encounter (Signed)
PLEASE SEND LAB PAPERWORK TO THE LAB FOR THE PATIENT, CALLED TODAY AND STATED THEY STILL HAVE NOT GOTTEN THEM IN THE MAIL

## 2017-08-24 ENCOUNTER — Ambulatory Visit (INDEPENDENT_AMBULATORY_CARE_PROVIDER_SITE_OTHER): Payer: Medicare HMO | Admitting: *Deleted

## 2017-08-24 DIAGNOSIS — I4891 Unspecified atrial fibrillation: Secondary | ICD-10-CM | POA: Diagnosis not present

## 2017-08-24 DIAGNOSIS — Z5181 Encounter for therapeutic drug level monitoring: Secondary | ICD-10-CM

## 2017-08-24 DIAGNOSIS — D500 Iron deficiency anemia secondary to blood loss (chronic): Secondary | ICD-10-CM | POA: Diagnosis not present

## 2017-08-24 LAB — POCT INR: INR: 2.8

## 2017-08-25 LAB — CBC WITH DIFFERENTIAL/PLATELET
BASOS ABS: 0 10*3/uL (ref 0.0–0.2)
Basos: 0 %
EOS (ABSOLUTE): 0 10*3/uL (ref 0.0–0.4)
EOS: 0 %
HEMATOCRIT: 45 % (ref 37.5–51.0)
HEMOGLOBIN: 14.5 g/dL (ref 13.0–17.7)
IMMATURE GRANS (ABS): 0 10*3/uL (ref 0.0–0.1)
Immature Granulocytes: 0 %
LYMPHS ABS: 1.4 10*3/uL (ref 0.7–3.1)
LYMPHS: 18 %
MCH: 30.1 pg (ref 26.6–33.0)
MCHC: 32.2 g/dL (ref 31.5–35.7)
MCV: 93 fL (ref 79–97)
Monocytes Absolute: 0.9 10*3/uL (ref 0.1–0.9)
Monocytes: 11 %
NEUTROS ABS: 5.4 10*3/uL (ref 1.4–7.0)
Neutrophils: 71 %
Platelets: 177 10*3/uL (ref 150–379)
RBC: 4.82 x10E6/uL (ref 4.14–5.80)
RDW: 14.1 % (ref 12.3–15.4)
WBC: 7.8 10*3/uL (ref 3.4–10.8)

## 2017-08-25 LAB — FERRITIN: FERRITIN: 85 ng/mL (ref 30–400)

## 2017-08-28 DIAGNOSIS — G4733 Obstructive sleep apnea (adult) (pediatric): Secondary | ICD-10-CM | POA: Diagnosis not present

## 2017-09-03 NOTE — Progress Notes (Signed)
Please let patient know his iron, H/H are normal. Continue iron at least three times weekly. Repeat cbc and ferritin in six months.   Make appt with rmr only. He is currently on my schedule but is overdue visit with RMR so please change this.

## 2017-09-05 ENCOUNTER — Other Ambulatory Visit: Payer: Self-pay

## 2017-09-05 DIAGNOSIS — D509 Iron deficiency anemia, unspecified: Secondary | ICD-10-CM

## 2017-09-05 NOTE — Progress Notes (Signed)
RESCHEDULED TO AN APPT WITH RMR AND PATIENT IS AWARE

## 2017-09-27 DIAGNOSIS — G4733 Obstructive sleep apnea (adult) (pediatric): Secondary | ICD-10-CM | POA: Diagnosis not present

## 2017-09-28 DIAGNOSIS — G4733 Obstructive sleep apnea (adult) (pediatric): Secondary | ICD-10-CM | POA: Diagnosis not present

## 2017-09-30 ENCOUNTER — Ambulatory Visit: Payer: Medicare HMO | Admitting: Gastroenterology

## 2017-10-05 ENCOUNTER — Ambulatory Visit (INDEPENDENT_AMBULATORY_CARE_PROVIDER_SITE_OTHER): Payer: Medicare HMO | Admitting: *Deleted

## 2017-10-05 DIAGNOSIS — I4891 Unspecified atrial fibrillation: Secondary | ICD-10-CM | POA: Diagnosis not present

## 2017-10-05 DIAGNOSIS — Z5181 Encounter for therapeutic drug level monitoring: Secondary | ICD-10-CM | POA: Diagnosis not present

## 2017-10-05 DIAGNOSIS — Z7901 Long term (current) use of anticoagulants: Secondary | ICD-10-CM

## 2017-10-05 LAB — POCT INR: INR: 2.1

## 2017-10-05 NOTE — Patient Instructions (Signed)
Continue coumadin 1/2 tablet daily except 1 tablet on Mondays, Wednesdays and Fridays. Continue 2 servings of salad/slaw per week and 1 can V8 only 3 days a week,  1/2 can V8 on Saturdays and Sundays (may substitute green peas, lima beans or snap beans) Recheck INR 6 wks

## 2017-10-07 ENCOUNTER — Encounter: Payer: Self-pay | Admitting: Internal Medicine

## 2017-10-07 ENCOUNTER — Ambulatory Visit: Payer: Medicare HMO | Admitting: Internal Medicine

## 2017-10-07 VITALS — BP 125/69 | HR 63 | Temp 97.8°F | Ht 64.0 in | Wt 149.0 lb

## 2017-10-07 DIAGNOSIS — D500 Iron deficiency anemia secondary to blood loss (chronic): Secondary | ICD-10-CM | POA: Diagnosis not present

## 2017-10-07 DIAGNOSIS — I739 Peripheral vascular disease, unspecified: Secondary | ICD-10-CM

## 2017-10-07 NOTE — Patient Instructions (Signed)
CBC, ferritin in July of this year  OV in September this year  Call with any problems that may arise

## 2017-10-07 NOTE — Progress Notes (Signed)
Primary Care Physician:  Sinda Du, MD Primary Gastroenterologist:  Dr. Gala Romney  Pre-Procedure History & Physical: HPI:  Steve Gomez is a 77 y.o. male here for returns for follow-up. History of iron deficiency anemia in part related to AVM status post gastric AVM ablation previously.  History of peripheral arterial disease with mesenteric arterial stenosis status post surgical bypass by Dr. Donnetta Hutching.  Clinically, patient is doing well he has no abdominal pain he is moving his bowels regularly. Stools intermittently dark and correlation with the iron supplement  - taken 3 times weekly. Ferritin and CBC recently normal; due for repeat labs in July. He is not having any abdominal pain whatsoever. Weight is stable appetite is good. Patient is very pleased with his progress. He continues to be anticoagulated.  Past Medical History:  Diagnosis Date  . Atherosclerosis   . Bladder outlet obstruction 01/14/2016  . COPD (chronic obstructive pulmonary disease) ([place])   . High cholesterol   . Hypertension   . New onset atrial fibrillation ([place])   . Renal calculi 01/14/2016  . S/P AAA repair   . Sleep apnea   . Tobacco abuse     Past Surgical History:  Procedure Laterality Date  . ABDOMINAL AORTIC ANEURYSM REPAIR    . CHOLECYSTECTOMY    . COLONOSCOPY N/A 01/27/2016   Procedure: COLONOSCOPY;  Surgeon: [name], MD;  [facility];  Service: Endoscopy;  Laterality: N/A;  . ESOPHAGOGASTRODUODENOSCOPY N/A 01/27/2016   Procedure: ESOPHAGOGASTRODUODENOSCOPY (EGD);  Surgeon: [name], MD;  [facility];  Service: Endoscopy;  Laterality: N/A;  . ESOPHAGOGASTRODUODENOSCOPY N/A 10/13/2016   Procedure: ESOPHAGOGASTRODUODENOSCOPY (EGD);  Surgeon: [name], MD;  [facility];  Service: Endoscopy;  Laterality: N/A;  130   . MESENTERIC ARTERY BYPASS N/A 02/04/2016   Procedure: AORTO-SUPERIOR MESENTERIC ARTERY BYPASS GRAFT USING 6MM X 30 CM HEMASHIELD GOLD  GRAFT;  Surgeon: [name], MD;  Location: [facility] OR;  Service: Vascular;  Laterality: N/A;    Prior to Admission medications   Medication Sig Start Date End Date Taking? Authorizing Provider  acetaminophen (TYLENOL) 500 MG tablet Take 500 mg by mouth every 6 (six) hours as needed for mild pain or moderate pain.   Yes [provider]  amiodarone (PACERONE) 200 MG tablet TAKE 1 TABLET EVERY DAY 01/12/17  Yes Josue Hector, MD  atorvastatin (LIPITOR) 20 MG tablet Take 20 mg by mouth at bedtime.  05/05/16  Yes Sinda Du, MD  chlorthalidone (HYGROTON) 25 MG tablet Take 25 mg by mouth daily.   Yes [provider]  famotidine (PEPCID) 20 MG tablet Take 1 tablet (20 mg total) by mouth daily. 02/09/16  Yes Robbie Lis, MD  ferrous sulfate 325 (65 FE) MG tablet Take 325 mg by mouth 3 (three) times a week.    Yes [provider]  Multiple Vitamin (MULTIVITAMIN) capsule Take 1 capsule by mouth daily.   Yes [provider]  OXYGEN Inhale 2 L into the lungs at bedtime.   Yes [provider]  tamsulosin (FLOMAX) 0.4 MG CAPS capsule Take 1 capsule (0.4 mg total) by mouth daily. 02/09/16  Yes Robbie Lis, MD  warfarin (COUMADIN) 3 MG tablet Take 1 tablet daily except 1/2 tablet on Mondays, Wednesdays and Fridays Patient taking differently: Take 1/2 tablet daily except 1 tablet on Mondays, Wednesdays and Fridays 07/11/17  Yes Josue Hector, MD    Allergies as of 10/07/2017  . (  No Known Allergies)    Family History  Problem Relation Age of Onset  . Suicidality Father        Deceased   . Diabetes Brother   . Diabetes Brother   . Diabetes Brother     Social History   Socioeconomic History  . Marital status: Married    Spouse name: Not on file  . Number of children: Not on file  . Years of education: Not on file  . Highest education level: Not on file  Social Needs  . Financial resource strain: Not on file  . Food insecurity - worry: Not on  file  . Food insecurity - inability: Not on file  . Transportation needs - medical: Not on file  . Transportation needs - non-medical: Not on file  Occupational History  . Not on file  Tobacco Use  . Smoking status: Former Smoker    Packs/day: 1.00    Types: Cigarettes    Start date: 08/30/1960    Last attempt to quit: 01/28/2016    Years since quitting: 1.6  . Smokeless tobacco: Never Used  Substance and Sexual Activity  . Alcohol use: Yes    Alcohol/week: 0.0 oz    Comment: "just a couple of swallows of wine every night"  . Drug use: No  . Sexual activity: Not on file  Other Topics Concern  . Not on file  Social History Narrative  . Not on file    Review of Systems: See HPI, otherwise negative ROS  Physical Exam: BP 125/69   Pulse 63   Temp 97.8 F (36.6 C) (Oral)   Ht 5\' 4"  (1.626 m)   Wt 149 lb (67.6 kg)   BMI 25.58 kg/m  General:   Alert,   pleasant and cooperative in NAD SLungs:  Clear throughout to auscultation.   No wheezes, crackles, or rhonchi. No acute distress. Heart:  Regular rate and rhythm; no murmurs, clicks, rubs,  or gallops. Abdomen: Non-distended, normal bowel sounds.  Soft and nontender without appreciable mass or hepatosplenomegaly.  Pulses:  Normal pulses noted. Extremities:  Without clubbing or edema.  Impression: Pleasant 77 year old gentleman with PAD  - status post mesenteric arterial bypass. Clinically, doing very well. History of iron deficiency anemia in part secondary to GI blood loss also seems to be doing very well at this time.  Recommendations: Aside from following up serial labs, no further GI evaluation warranted at this time. Of course, should symptoms recur, then management plans would change.  Plan see this pleasant gentleman back in September and repeat labs in July of this year.        Notice: This dictation was prepared with Dragon dictation along with smaller phrase technology. Any transcriptional errors that result  from this process are unintentional and may not be corrected upon review.

## 2017-10-10 DIAGNOSIS — J449 Chronic obstructive pulmonary disease, unspecified: Secondary | ICD-10-CM | POA: Diagnosis not present

## 2017-10-10 DIAGNOSIS — I10 Essential (primary) hypertension: Secondary | ICD-10-CM | POA: Diagnosis not present

## 2017-10-10 DIAGNOSIS — I25119 Atherosclerotic heart disease of native coronary artery with unspecified angina pectoris: Secondary | ICD-10-CM | POA: Diagnosis not present

## 2017-10-10 DIAGNOSIS — I4891 Unspecified atrial fibrillation: Secondary | ICD-10-CM | POA: Diagnosis not present

## 2017-10-14 DIAGNOSIS — H524 Presbyopia: Secondary | ICD-10-CM | POA: Diagnosis not present

## 2017-10-14 DIAGNOSIS — H25013 Cortical age-related cataract, bilateral: Secondary | ICD-10-CM | POA: Diagnosis not present

## 2017-10-27 DIAGNOSIS — G4733 Obstructive sleep apnea (adult) (pediatric): Secondary | ICD-10-CM | POA: Diagnosis not present

## 2017-11-05 ENCOUNTER — Other Ambulatory Visit: Payer: Self-pay | Admitting: Cardiovascular Disease

## 2017-11-08 ENCOUNTER — Other Ambulatory Visit: Payer: Self-pay | Admitting: Adult Health

## 2017-11-16 ENCOUNTER — Ambulatory Visit (INDEPENDENT_AMBULATORY_CARE_PROVIDER_SITE_OTHER): Payer: Medicare HMO | Admitting: *Deleted

## 2017-11-16 DIAGNOSIS — I4891 Unspecified atrial fibrillation: Secondary | ICD-10-CM

## 2017-11-16 DIAGNOSIS — Z5181 Encounter for therapeutic drug level monitoring: Secondary | ICD-10-CM | POA: Diagnosis not present

## 2017-11-16 LAB — POCT INR: INR: 2.1

## 2017-11-16 NOTE — Patient Instructions (Signed)
Continue coumadin 1/2 tablet daily except 1 tablet on Mondays, Wednesdays and Fridays. Continue 2 servings of salad/slaw per week and 1 can V8 only 3 days a week,  1/2 can V8 on Saturdays and Sundays (may substitute green peas, lima beans or snap beans) Recheck INR 6 wks

## 2017-11-26 DIAGNOSIS — G4733 Obstructive sleep apnea (adult) (pediatric): Secondary | ICD-10-CM | POA: Diagnosis not present

## 2017-12-27 DIAGNOSIS — G4733 Obstructive sleep apnea (adult) (pediatric): Secondary | ICD-10-CM | POA: Diagnosis not present

## 2017-12-28 ENCOUNTER — Ambulatory Visit (INDEPENDENT_AMBULATORY_CARE_PROVIDER_SITE_OTHER): Payer: Medicare HMO | Admitting: *Deleted

## 2017-12-28 DIAGNOSIS — Z5181 Encounter for therapeutic drug level monitoring: Secondary | ICD-10-CM

## 2017-12-28 DIAGNOSIS — I4891 Unspecified atrial fibrillation: Secondary | ICD-10-CM | POA: Diagnosis not present

## 2017-12-28 LAB — POCT INR: INR: 2

## 2017-12-28 NOTE — Patient Instructions (Signed)
Continue coumadin 1/2 tablet daily except 1 tablet on Mondays, Wednesdays and Fridays. Continue 2 servings of salad/slaw per week and 1 can V8 only 3 days a week,  1/2 can V8 on Saturdays and Sundays (may substitute green peas, lima beans or snap beans) Recheck INR 6 wks

## 2018-01-17 NOTE — Progress Notes (Signed)
Cardiology Office Note   Date:  01/18/2018   ID:  Steve Gomez, DOB 1941-04-16, MRN [id number]  PCP:  Sinda Du, MD  Cardiologist: Oswaldo Conroy, MD   No chief complaint on file.     History of Present Illness: Steve Gomez is a 77 y.o. male who presents for ongoing assessment and management of atrial fibrillation, mesenteric stenosis, with bypass, s/p AAA, hypertension, hypercholesterolemia. I have not seen in over 2 years INR;s checked in our office and ok   Echocardiogram 01/14/2016  Left ventricle: The cavity size was normal. Wall thickness was  normal. Systolic function was normal. The estimated ejection  fraction was in the range of 55% to 60%. Wall motion was normal;  there were no regional wall motion abnormalities. - Mitral valve: Calcified annulus. There was moderate  regurgitation. - Tricuspid valve: There was moderate regurgitation.  He is here today without any cardiac complaints. He denies rapid heart rhythm, breathing issues, chest pain, he has no excessive bleeding or bruising. He is medically compliant.  Past Medical History:  Diagnosis Date  . Atherosclerosis   . Bladder outlet obstruction 01/14/2016  . COPD (chronic obstructive pulmonary disease) ([place])   . High cholesterol   . Hypertension   . New onset atrial fibrillation ([place])   . Renal calculi 01/14/2016  . S/P AAA repair   . Sleep apnea   . Tobacco abuse     Past Surgical History:  Procedure Laterality Date  . ABDOMINAL AORTIC ANEURYSM REPAIR    . CHOLECYSTECTOMY    . COLONOSCOPY N/A 01/27/2016   Procedure: COLONOSCOPY;  Surgeon: [name], MD;  [facility];  Service: Endoscopy;  Laterality: N/A;  . ESOPHAGOGASTRODUODENOSCOPY N/A 01/27/2016   Procedure: ESOPHAGOGASTRODUODENOSCOPY (EGD);  Surgeon: [name], MD;  [facility];  Service: Endoscopy;  Laterality: N/A;  . ESOPHAGOGASTRODUODENOSCOPY N/A 10/13/2016   Procedure: ESOPHAGOGASTRODUODENOSCOPY  (EGD);  Surgeon: [name], MD;  [facility];  Service: Endoscopy;  Laterality: N/A;  130   . MESENTERIC ARTERY BYPASS N/A 02/04/2016   Procedure: AORTO-SUPERIOR MESENTERIC ARTERY BYPASS GRAFT USING 6MM X 30 CM HEMASHIELD GOLD GRAFT;  Surgeon: [name], MD;  Location: [facility] OR;  Service: Vascular;  Laterality: N/A;     Current Outpatient Medications  Medication Sig Dispense Refill  . acetaminophen (TYLENOL) 500 MG tablet Take 500 mg by mouth every 6 (six) hours as needed for mild pain or moderate pain.    Marland Kitchen atorvastatin (LIPITOR) 20 MG tablet Take 20 mg by mouth at bedtime.     . chlorthalidone (HYGROTON) 25 MG tablet Take 25 mg by mouth daily.    . famotidine (PEPCID) 20 MG tablet Take 1 tablet (20 mg total) by mouth daily. 30 tablet 0  . ferrous sulfate 325 (65 FE) MG tablet Take 325 mg by mouth 3 (three) times a week.     . Multiple Vitamin (MULTIVITAMIN) capsule Take 1 capsule by mouth daily.    . OXYGEN Inhale 2 L into the lungs at bedtime.    . tamsulosin (FLOMAX) 0.4 MG CAPS capsule Take 1 capsule (0.4 mg total) by mouth daily. 30 capsule 0  . warfarin (COUMADIN) 3 MG tablet Take 1/2 tablet daily except 1 tablet on Mondays, Wednesdays and Fridays 78 tablet 1   No current facility-administered medications for this visit.     Allergies:   Patient has no known allergies.    Social History:  The patient  reports that  he quit smoking about 1 years ago. His smoking use included cigarettes. He started smoking about 57 years ago. He smoked 1.00 pack per day. He has never used smokeless tobacco. He reports that he drank alcohol. He reports that he does not use drugs.   Family History:  The patient's family history includes Diabetes in his brother, brother, and brother; Suicidality in his father.    ROS: All other systems are reviewed and negative. Unless otherwise mentioned in H&P    PHYSICAL EXAM: VS:  BP 126/74 (BP Location: Right Arm)   Pulse 75   Ht 5\' 4"  (1.626  m)   Wt 142 lb (64.4 kg)   SpO2 93%   BMI 24.37 kg/m  , BMI Body mass index is 24.37 kg/m. Affect appropriate Healthy:  appears stated age 12: normal Neck supple with no adenopathy JVP normal no bruits no thyromegaly Lungs clear with no wheezing and good diaphragmatic motion Heart:  S1/S2 no murmur, no rub, gallop or click PMI normal Abdomen: benighn, BS positve, no tenderness, no AAA no bruit.  No HSM or HJR Distal pulses intact with no bruits No edema Neuro non-focal Skin warm and dry No muscular weakness    EKG:  Normal sinus rhythm with right bundle branch block, heart rate is 79 bpm.  Recent Labs: 08/24/2017: Hemoglobin 14.5; Platelets 177    Lipid Panel    Component Value Date/Time   CHOL 77 01/15/2016 0223   TRIG 107 01/15/2016 0223   HDL 20 (L) 01/15/2016 0223   CHOLHDL 3.9 01/15/2016 0223   VLDL 21 01/15/2016 0223   LDLCALC 36 01/15/2016 0223      Wt Readings from Last 3 Encounters:  01/18/18 142 lb (64.4 kg)  10/07/17 149 lb (67.6 kg)  01/25/17 150 lb (68 kg)      ASSESSMENT AND PLAN:  1. Atrial fibrillation: maintained NSR for long time d/c amiodarone given oxygen dependent COPD  I cannot see that PFTls with DLCO have been done recently   2. Hypercholesterolemia: Continue on statin therapy. Follow-up labs this week.  3. Hypertension: Well controlled.  Continue current medications and low sodium Dash type diet.     Current medicines are reviewed at length with the patient today.    Labs/ tests ordered today include:  Orders Placed This Encounter  Procedures  . EKG 12-Lead     Disposition:   FU with One year unless symptomatic.  Signed, Jenkins Rouge, MD  01/18/2018 3:43 PM    [facility] [place], [place] [address] Phone: [phone]; Fax: [phone]

## 2018-01-18 ENCOUNTER — Encounter: Payer: Self-pay | Admitting: Cardiovascular Disease

## 2018-01-18 ENCOUNTER — Ambulatory Visit: Payer: Medicare HMO | Admitting: Cardiovascular Disease

## 2018-01-18 VITALS — BP 126/74 | HR 75 | Ht 64.0 in | Wt 142.0 lb

## 2018-01-18 DIAGNOSIS — I482 Chronic atrial fibrillation, unspecified: Secondary | ICD-10-CM

## 2018-01-18 NOTE — Patient Instructions (Signed)
Medication Instructions:  STOP AMIODARONE   Labwork: NONE  Testing/Procedures: NONE  Follow-Up: Your physician wants you to follow-up in: 1 YEAR.  You will receive a reminder letter in the mail two months in advance. If you don't receive a letter, please call our office to schedule the follow-up appointment.   Any Other Special Instructions Will Be Listed Below (If Applicable).     If you need a refill on your cardiac medications before your next appointment, please call your pharmacy.

## 2018-01-26 DIAGNOSIS — G4733 Obstructive sleep apnea (adult) (pediatric): Secondary | ICD-10-CM | POA: Diagnosis not present

## 2018-01-31 ENCOUNTER — Encounter: Payer: Self-pay | Admitting: *Deleted

## 2018-01-31 ENCOUNTER — Other Ambulatory Visit: Payer: Self-pay | Admitting: *Deleted

## 2018-01-31 DIAGNOSIS — I25119 Atherosclerotic heart disease of native coronary artery with unspecified angina pectoris: Secondary | ICD-10-CM | POA: Diagnosis not present

## 2018-01-31 DIAGNOSIS — I4891 Unspecified atrial fibrillation: Secondary | ICD-10-CM | POA: Diagnosis not present

## 2018-01-31 DIAGNOSIS — D509 Iron deficiency anemia, unspecified: Secondary | ICD-10-CM

## 2018-01-31 DIAGNOSIS — J449 Chronic obstructive pulmonary disease, unspecified: Secondary | ICD-10-CM | POA: Diagnosis not present

## 2018-01-31 DIAGNOSIS — I10 Essential (primary) hypertension: Secondary | ICD-10-CM | POA: Diagnosis not present

## 2018-02-08 ENCOUNTER — Ambulatory Visit (INDEPENDENT_AMBULATORY_CARE_PROVIDER_SITE_OTHER): Payer: Medicare HMO | Admitting: *Deleted

## 2018-02-08 ENCOUNTER — Other Ambulatory Visit (HOSPITAL_COMMUNITY)
Admission: RE | Admit: 2018-02-08 | Discharge: 2018-02-08 | Disposition: A | Discharge status: Home / Self Care | Payer: Medicare HMO | Source: Ambulatory Visit | Attending: Gastroenterology | Admitting: Gastroenterology

## 2018-02-08 DIAGNOSIS — D509 Iron deficiency anemia, unspecified: Secondary | ICD-10-CM | POA: Insufficient documentation

## 2018-02-08 DIAGNOSIS — Z5181 Encounter for therapeutic drug level monitoring: Secondary | ICD-10-CM

## 2018-02-08 DIAGNOSIS — I4891 Unspecified atrial fibrillation: Secondary | ICD-10-CM | POA: Diagnosis not present

## 2018-02-08 LAB — CBC WITH DIFFERENTIAL/PLATELET
Basophils Absolute: 0 10*3/uL (ref 0.0–0.1)
Basophils Relative: 0 %
EOS ABS: 0.1 10*3/uL (ref 0.0–0.7)
Eosinophils Relative: 1 %
HEMATOCRIT: 44 % (ref 39.0–52.0)
HEMOGLOBIN: 14.2 g/dL (ref 13.0–17.0)
Lymphocytes Relative: 24 %
Lymphs Abs: 1.6 10*3/uL (ref 0.7–4.0)
MCH: 30.5 pg (ref 26.0–34.0)
MCHC: 32.3 g/dL (ref 30.0–36.0)
MCV: 94.6 fL (ref 78.0–100.0)
MONOS PCT: 12 %
Monocytes Absolute: 0.8 10*3/uL (ref 0.1–1.0)
NEUTROS ABS: 4.1 10*3/uL (ref 1.7–7.7)
NEUTROS PCT: 63 %
Platelets: 149 10*3/uL — ABNORMAL LOW (ref 150–400)
RBC: 4.65 MIL/uL (ref 4.22–5.81)
RDW: 14 % (ref 11.5–15.5)
WBC: 6.7 10*3/uL (ref 4.0–10.5)

## 2018-02-08 LAB — FERRITIN: Ferritin: 48 ng/mL (ref 24–336)

## 2018-02-08 LAB — POCT INR: INR: 1.7 — AB (ref 2.0–3.0)

## 2018-02-08 NOTE — Patient Instructions (Signed)
Take coumadin 1 1/2 tablets tonight then increase dose to 1 tablet daily except 1/2 tablet on Sundays, Tuesdays and Thursdays Continue 2 servings of salad/slaw per week and 1 can V8 only 3 days a week,  1/2 can V8 on Saturdays and Sundays (may substitute green peas, lima beans or snap beans) Recheck INR 3 wks

## 2018-02-14 ENCOUNTER — Other Ambulatory Visit: Payer: Self-pay

## 2018-02-14 DIAGNOSIS — D509 Iron deficiency anemia, unspecified: Secondary | ICD-10-CM

## 2018-02-26 DIAGNOSIS — G4733 Obstructive sleep apnea (adult) (pediatric): Secondary | ICD-10-CM | POA: Diagnosis not present

## 2018-03-01 ENCOUNTER — Ambulatory Visit (INDEPENDENT_AMBULATORY_CARE_PROVIDER_SITE_OTHER): Payer: Medicare HMO | Admitting: *Deleted

## 2018-03-01 DIAGNOSIS — Z5181 Encounter for therapeutic drug level monitoring: Secondary | ICD-10-CM

## 2018-03-01 DIAGNOSIS — I4891 Unspecified atrial fibrillation: Secondary | ICD-10-CM | POA: Diagnosis not present

## 2018-03-01 LAB — POCT INR: INR: 2.5 (ref 2.0–3.0)

## 2018-03-01 NOTE — Patient Instructions (Signed)
Continue coumadin 1 tablet daily except 1/2 tablet on Sundays, Tuesdays and Thursdays Continue 2 servings of salad/slaw per week and 1 can V8 only 3 days a week,  1/2 can V8 on Saturdays and Sundays (may substitute green peas, lima beans or snap beans) Recheck INR 4 wks

## 2018-03-07 ENCOUNTER — Encounter: Payer: Self-pay | Admitting: Internal Medicine

## 2018-03-22 ENCOUNTER — Other Ambulatory Visit: Payer: Self-pay | Admitting: Cardiovascular Disease

## 2018-03-28 DIAGNOSIS — G4733 Obstructive sleep apnea (adult) (pediatric): Secondary | ICD-10-CM | POA: Diagnosis not present

## 2018-03-29 ENCOUNTER — Ambulatory Visit (INDEPENDENT_AMBULATORY_CARE_PROVIDER_SITE_OTHER): Payer: Medicare HMO | Admitting: *Deleted

## 2018-03-29 DIAGNOSIS — Z5181 Encounter for therapeutic drug level monitoring: Secondary | ICD-10-CM

## 2018-03-29 DIAGNOSIS — I4891 Unspecified atrial fibrillation: Secondary | ICD-10-CM

## 2018-03-29 LAB — POCT INR: INR: 2.1 (ref 2.0–3.0)

## 2018-03-29 NOTE — Patient Instructions (Signed)
Continue coumadin 1 tablet daily except 1/2 tablet on Sundays, Tuesdays and Thursdays Continue 2 servings of salad/slaw per week and 1 can V8 only 3 days a week,  1/2 can V8 on Saturdays and Sundays (may substitute green peas, lima beans or snap beans) Recheck INR 4 wks

## 2018-04-26 ENCOUNTER — Ambulatory Visit (INDEPENDENT_AMBULATORY_CARE_PROVIDER_SITE_OTHER): Payer: Medicare HMO | Admitting: *Deleted

## 2018-04-26 DIAGNOSIS — I4891 Unspecified atrial fibrillation: Secondary | ICD-10-CM

## 2018-04-26 DIAGNOSIS — Z5181 Encounter for therapeutic drug level monitoring: Secondary | ICD-10-CM | POA: Diagnosis not present

## 2018-04-26 LAB — POCT INR: INR: 1.6 — AB (ref 2.0–3.0)

## 2018-04-26 NOTE — Patient Instructions (Signed)
Take coumadin 1 1/2 tablets tonight, take 1 tablet tomorrow night then resume 1 tablet daily except 1/2 tablet on Sundays, Tuesdays and Thursdays Continue 2 servings of salad/slaw per week and 1 can V8 only 3 days a week,  1/2 can V8 on Saturdays and Sundays (may substitute green peas, lima beans or snap beans) Recheck INR 2.5 wks

## 2018-04-28 DIAGNOSIS — G4733 Obstructive sleep apnea (adult) (pediatric): Secondary | ICD-10-CM | POA: Diagnosis not present

## 2018-05-15 ENCOUNTER — Other Ambulatory Visit: Payer: Self-pay

## 2018-05-15 ENCOUNTER — Ambulatory Visit (INDEPENDENT_AMBULATORY_CARE_PROVIDER_SITE_OTHER): Payer: Medicare HMO | Admitting: *Deleted

## 2018-05-15 DIAGNOSIS — Z5181 Encounter for therapeutic drug level monitoring: Secondary | ICD-10-CM

## 2018-05-15 DIAGNOSIS — I4891 Unspecified atrial fibrillation: Secondary | ICD-10-CM | POA: Diagnosis not present

## 2018-05-15 DIAGNOSIS — D509 Iron deficiency anemia, unspecified: Secondary | ICD-10-CM

## 2018-05-15 LAB — POCT INR: INR: 1.8 — AB (ref 2.0–3.0)

## 2018-05-15 NOTE — Patient Instructions (Signed)
Increase coumadin to 1 tablet daily except 1/2 tablet on Sundays and Thursdays Continue 2 servings of salad/slaw per week and 1 can V8 only 3 days a week,  1/2 can V8 on Saturdays and Sundays (may substitute green peas, lima beans or snap beans) Recheck INR 3 wks

## 2018-05-26 ENCOUNTER — Ambulatory Visit: Payer: Medicare HMO | Admitting: Internal Medicine

## 2018-05-29 DIAGNOSIS — G4733 Obstructive sleep apnea (adult) (pediatric): Secondary | ICD-10-CM | POA: Diagnosis not present

## 2018-06-05 ENCOUNTER — Ambulatory Visit (INDEPENDENT_AMBULATORY_CARE_PROVIDER_SITE_OTHER): Payer: Medicare HMO | Admitting: *Deleted

## 2018-06-05 DIAGNOSIS — Z5181 Encounter for therapeutic drug level monitoring: Secondary | ICD-10-CM

## 2018-06-05 DIAGNOSIS — I4891 Unspecified atrial fibrillation: Secondary | ICD-10-CM

## 2018-06-05 LAB — POCT INR: INR: 1.9 — AB (ref 2.0–3.0)

## 2018-06-05 NOTE — Patient Instructions (Signed)
Increase coumadin to 1 tablet daily except 1/2 tablet on Sundays Continue 2 servings of salad/slaw per week and 1 can V8 only 3 days a week,  1/2 can V8 on Saturdays and Sundays (may substitute green peas, lima beans or snap beans) Recheck INR 3 wks

## 2018-06-08 DIAGNOSIS — Z23 Encounter for immunization: Secondary | ICD-10-CM | POA: Diagnosis not present

## 2018-06-08 DIAGNOSIS — Z0000 Encounter for general adult medical examination without abnormal findings: Secondary | ICD-10-CM | POA: Diagnosis not present

## 2018-06-15 ENCOUNTER — Other Ambulatory Visit: Payer: Self-pay | Admitting: Gastroenterology

## 2018-06-15 DIAGNOSIS — G4733 Obstructive sleep apnea (adult) (pediatric): Secondary | ICD-10-CM | POA: Diagnosis not present

## 2018-06-15 DIAGNOSIS — F17200 Nicotine dependence, unspecified, uncomplicated: Secondary | ICD-10-CM | POA: Diagnosis not present

## 2018-06-15 DIAGNOSIS — D509 Iron deficiency anemia, unspecified: Secondary | ICD-10-CM | POA: Diagnosis not present

## 2018-06-15 DIAGNOSIS — Z0000 Encounter for general adult medical examination without abnormal findings: Secondary | ICD-10-CM | POA: Diagnosis not present

## 2018-06-15 DIAGNOSIS — Z125 Encounter for screening for malignant neoplasm of prostate: Secondary | ICD-10-CM | POA: Diagnosis not present

## 2018-06-15 DIAGNOSIS — I4891 Unspecified atrial fibrillation: Secondary | ICD-10-CM | POA: Diagnosis not present

## 2018-06-15 DIAGNOSIS — R634 Abnormal weight loss: Secondary | ICD-10-CM | POA: Diagnosis not present

## 2018-06-15 DIAGNOSIS — I10 Essential (primary) hypertension: Secondary | ICD-10-CM | POA: Diagnosis not present

## 2018-06-15 DIAGNOSIS — J449 Chronic obstructive pulmonary disease, unspecified: Secondary | ICD-10-CM | POA: Diagnosis not present

## 2018-06-15 DIAGNOSIS — I25119 Atherosclerotic heart disease of native coronary artery with unspecified angina pectoris: Secondary | ICD-10-CM | POA: Diagnosis not present

## 2018-06-16 LAB — FERRITIN: Ferritin: 109 ng/mL (ref 30–400)

## 2018-06-19 ENCOUNTER — Telehealth: Payer: Self-pay

## 2018-06-19 NOTE — Telephone Encounter (Signed)
[facility] results collected on 06/15/2018 ordered by Dr. Luan Pulling and faxed copies here for Neil Crouch, PA . Placed in her box for review.

## 2018-06-19 NOTE — Telephone Encounter (Signed)
Noted  

## 2018-06-26 ENCOUNTER — Ambulatory Visit (INDEPENDENT_AMBULATORY_CARE_PROVIDER_SITE_OTHER): Payer: Medicare HMO | Admitting: *Deleted

## 2018-06-26 DIAGNOSIS — Z5181 Encounter for therapeutic drug level monitoring: Secondary | ICD-10-CM | POA: Diagnosis not present

## 2018-06-26 DIAGNOSIS — I4891 Unspecified atrial fibrillation: Secondary | ICD-10-CM | POA: Diagnosis not present

## 2018-06-26 LAB — POCT INR: INR: 2.2 (ref 2.0–3.0)

## 2018-06-26 NOTE — Patient Instructions (Signed)
Continue coumadin 1 tablet daily except 1/2 tablet on Sundays Continue 2 servings of salad/slaw per week and 1 can V8 only 3 days a week,  1/2 can V8 on Saturdays and Sundays (may substitute green peas, lima beans or snap beans) Recheck INR 4 wks

## 2018-06-27 ENCOUNTER — Encounter: Payer: Self-pay | Admitting: Internal Medicine

## 2018-06-27 ENCOUNTER — Ambulatory Visit: Payer: Medicare HMO | Admitting: Internal Medicine

## 2018-06-27 VITALS — BP 123/68 | HR 87 | Temp 97.6°F | Ht 65.0 in | Wt 148.2 lb

## 2018-06-27 DIAGNOSIS — K274 Chronic or unspecified peptic ulcer, site unspecified, with hemorrhage: Secondary | ICD-10-CM | POA: Diagnosis not present

## 2018-06-27 DIAGNOSIS — R634 Abnormal weight loss: Secondary | ICD-10-CM

## 2018-06-27 NOTE — Patient Instructions (Signed)
Stop Iron for now  Serum ferritin and CBC in 3 months  GERD information  May continue Famotidine for GERD  Office visit in 6 months

## 2018-06-27 NOTE — Progress Notes (Signed)
Primary Care Physician:  Sinda Du, MD Primary Gastroenterologist:  Dr. Gala Romney  Pre-Procedure History & Physical: HPI:  Steve Gomez is a 77 y.o. male here for follow-up of iron deficiency anemia secondary to AVMs and ischemic ulceration of the GI tract.. Status post mesenteric vascular bypass previously.  Doing very well.  He is gained weight weight  - now remains stable at 148 pounds -  he is not having any postprandial abdominal pain.  No melena or hematochezia.  Bowel function described as being normal.  Takes famotidine daily for reflux.  No dysphagia.  Recent labs indicate a normal white count H&H 15.4 and 46.2 serum ferritin 109.  PSA elevated in the 9 range for which Dr. Luan Pulling is referring him to a urologist.  Past Medical History:  Diagnosis Date  . Atherosclerosis   . Bladder outlet obstruction 01/14/2016  . COPD (chronic obstructive pulmonary disease) ([place])   . High cholesterol   . Hypertension   . New onset atrial fibrillation ([place])   . Renal calculi 01/14/2016  . S/P AAA repair   . Sleep apnea   . Tobacco abuse     Past Surgical History:  Procedure Laterality Date  . ABDOMINAL AORTIC ANEURYSM REPAIR    . CHOLECYSTECTOMY    . COLONOSCOPY N/A 01/27/2016   Procedure: COLONOSCOPY;  Surgeon: [name], MD;  [facility];  Service: Endoscopy;  Laterality: N/A;  . ESOPHAGOGASTRODUODENOSCOPY N/A 01/27/2016   Procedure: ESOPHAGOGASTRODUODENOSCOPY (EGD);  Surgeon: [name], MD;  [facility];  Service: Endoscopy;  Laterality: N/A;  . ESOPHAGOGASTRODUODENOSCOPY N/A 10/13/2016   Procedure: ESOPHAGOGASTRODUODENOSCOPY (EGD);  Surgeon: [name], MD;  [facility];  Service: Endoscopy;  Laterality: N/A;  130   . MESENTERIC ARTERY BYPASS N/A 02/04/2016   Procedure: AORTO-SUPERIOR MESENTERIC ARTERY BYPASS GRAFT USING 6MM X 30 CM HEMASHIELD GOLD GRAFT;  Surgeon: [name], MD;  Location: [facility] OR;  Service: Vascular;  Laterality:  N/A;    Prior to Admission medications   Medication Sig Start Date End Date Taking? Authorizing Provider  acetaminophen (TYLENOL) 500 MG tablet Take 500 mg by mouth every 6 (six) hours as needed for mild pain or moderate pain.   Yes [provider]  atorvastatin (LIPITOR) 20 MG tablet Take 20 mg by mouth at bedtime.  05/05/16  Yes Sinda Du, MD  chlorthalidone (HYGROTON) 25 MG tablet Take 25 mg by mouth daily.   Yes [provider]  famotidine (PEPCID) 20 MG tablet Take 1 tablet (20 mg total) by mouth daily. 02/09/16  Yes Robbie Lis, MD  ferrous sulfate 325 (65 FE) MG tablet Take 325 mg by mouth 3 (three) times a week.    Yes [provider]  Multiple Vitamin (MULTIVITAMIN) capsule Take 1 capsule by mouth daily.   Yes [provider]  OXYGEN Inhale 2 L into the lungs at bedtime.   Yes [provider]  tamsulosin (FLOMAX) 0.4 MG CAPS capsule Take 1 capsule (0.4 mg total) by mouth daily. 02/09/16  Yes Robbie Lis, MD  warfarin (COUMADIN) 3 MG tablet Take 1 tablet daily except 1/2 tablet on Sundays, Tuesdays and Thursdays 03/22/18  Yes Branch, Alphonse Guild, MD    Allergies as of 06/27/2018  . (No Known Allergies)    Family History  Problem Relation Age of Onset  . Suicidality Father        Deceased   . Diabetes Brother   . Diabetes Brother   .  Diabetes Brother     Social History   Socioeconomic History  . Marital status: Married    Spouse name: Not on file  . Number of children: Not on file  . Years of education: Not on file  . Highest education level: Not on file  Occupational History  . Not on file  Social Needs  . Financial resource strain: Not on file  . Food insecurity:    Worry: Not on file    Inability: Not on file  . Transportation needs:    Medical: Not on file    Non-medical: Not on file  Tobacco Use  . Smoking status: Former Smoker    Packs/day: 1.00    Types: Cigarettes    Start date: 08/30/1960    Last  attempt to quit: 01/28/2016    Years since quitting: 2.4  . Smokeless tobacco: Never Used  Substance and Sexual Activity  . Alcohol use: Not Currently    Alcohol/week: 0.0 standard drinks    Comment: "just a couple of swallows of wine every night"  . Drug use: No  . Sexual activity: Not on file  Lifestyle  . Physical activity:    Days per week: Not on file    Minutes per session: Not on file  . Stress: Not on file  Relationships  . Social connections:    Talks on phone: Not on file    Gets together: Not on file    Attends religious service: Not on file    Active member of club or organization: Not on file    Attends meetings of clubs or organizations: Not on file    Relationship status: Not on file  . Intimate partner violence:    Fear of current or ex partner: Not on file    Emotionally abused: Not on file    Physically abused: Not on file    Forced sexual activity: Not on file  Other Topics Concern  . Not on file  Social History Narrative  . Not on file    Review of Systems: See HPI, otherwise negative ROS  Physical Exam: BP 123/68   Pulse 87   Temp 97.6 F (36.4 C) (Oral)   Ht 5\' 5"  (1.651 m)   Wt 148 lb 3.2 oz (67.2 kg)   BMI 24.66 kg/m  General:   Alert,  Well-developed, well-nourished, pleasant and cooperative in NAD pathy. Lungs:  Clear throughout to auscultation.   No wheezes, crackles, or rhonchi. No acute distress. Heart:  Regular rate and rhythm; no murmurs, clicks, rubs,  or gallops. Abdomen: Non-distended, normal bowel sounds.  Soft and nontender without appreciable mass or hepatosplenomegaly.  Pulses:  Normal pulses noted. Extremities:  Without clubbing or edema.  Impression/Plan: 77 year old gentleman with a history of GI bleeding secondary AVMs and ischemic ulceration of his GI tract.  Clinically, doing very well at this time.  He has GERD  -  well-controlled on H2 blocker therapy. He may no longer need iron supplementation.  Ferritin  normal.  Recommendations:  Stop Iron for now  Serum ferritin and CBC in 3 months  GERD information  May continue Famotidine for GERD  Office visit in 6 months  Notice: This dictation was prepared with Dragon dictation along with smaller phrase technology. Any transcriptional errors that result from this process are unintentional and may not be corrected upon review.

## 2018-06-28 ENCOUNTER — Other Ambulatory Visit: Payer: Self-pay

## 2018-06-28 DIAGNOSIS — G4733 Obstructive sleep apnea (adult) (pediatric): Secondary | ICD-10-CM | POA: Diagnosis not present

## 2018-06-28 DIAGNOSIS — D649 Anemia, unspecified: Secondary | ICD-10-CM

## 2018-06-29 ENCOUNTER — Other Ambulatory Visit: Payer: Self-pay

## 2018-06-29 DIAGNOSIS — D649 Anemia, unspecified: Secondary | ICD-10-CM

## 2018-06-29 NOTE — Progress Notes (Signed)
Thanks Magda Paganini. I have added the ferritin.

## 2018-07-24 ENCOUNTER — Ambulatory Visit (INDEPENDENT_AMBULATORY_CARE_PROVIDER_SITE_OTHER): Payer: Medicare HMO | Admitting: *Deleted

## 2018-07-24 DIAGNOSIS — I4891 Unspecified atrial fibrillation: Secondary | ICD-10-CM

## 2018-07-24 DIAGNOSIS — Z5181 Encounter for therapeutic drug level monitoring: Secondary | ICD-10-CM

## 2018-07-24 LAB — POCT INR: INR: 2 (ref 2.0–3.0)

## 2018-07-24 NOTE — Patient Instructions (Signed)
Take coumadin 1 1/2 tablets tonight then increase dose to 1 tablet daily Continue 2 servings of salad/slaw per week and 1 can V8 only 3 days a week,  1/2 can V8 on Saturdays and Sundays (may substitute green peas, lima beans or snap beans) Recheck INR 5 wks

## 2018-07-29 DIAGNOSIS — G4733 Obstructive sleep apnea (adult) (pediatric): Secondary | ICD-10-CM | POA: Diagnosis not present

## 2018-07-31 ENCOUNTER — Other Ambulatory Visit: Payer: Self-pay

## 2018-07-31 DIAGNOSIS — D649 Anemia, unspecified: Secondary | ICD-10-CM

## 2018-08-28 ENCOUNTER — Ambulatory Visit (INDEPENDENT_AMBULATORY_CARE_PROVIDER_SITE_OTHER): Payer: Medicare HMO | Admitting: *Deleted

## 2018-08-28 DIAGNOSIS — G4733 Obstructive sleep apnea (adult) (pediatric): Secondary | ICD-10-CM | POA: Diagnosis not present

## 2018-08-28 DIAGNOSIS — I4891 Unspecified atrial fibrillation: Secondary | ICD-10-CM

## 2018-08-28 DIAGNOSIS — Z5181 Encounter for therapeutic drug level monitoring: Secondary | ICD-10-CM | POA: Diagnosis not present

## 2018-08-28 LAB — POCT INR: INR: 1.8 — AB (ref 2.0–3.0)

## 2018-08-28 NOTE — Patient Instructions (Signed)
Take coumadin 1 1/2 tablets tonight then increase dose to 1 tablet daily except 1 1/2 tablets on Wednesdays Stop V8 juice Recheck INR 3 wks

## 2018-09-06 ENCOUNTER — Ambulatory Visit: Payer: Medicare HMO | Admitting: Urology

## 2018-09-06 ENCOUNTER — Other Ambulatory Visit: Payer: Self-pay | Admitting: Urology

## 2018-09-06 ENCOUNTER — Other Ambulatory Visit: Payer: Self-pay | Admitting: Internal Medicine

## 2018-09-06 ENCOUNTER — Other Ambulatory Visit (HOSPITAL_COMMUNITY): Payer: Self-pay | Admitting: Urology

## 2018-09-06 DIAGNOSIS — R972 Elevated prostate specific antigen [PSA]: Secondary | ICD-10-CM

## 2018-09-06 DIAGNOSIS — D649 Anemia, unspecified: Secondary | ICD-10-CM | POA: Diagnosis not present

## 2018-09-07 LAB — CBC/DIFF AMBIGUOUS DEFAULT
Basophils Absolute: 0 10*3/uL (ref 0.0–0.2)
Basos: 1 %
EOS (ABSOLUTE): 0.1 10*3/uL (ref 0.0–0.4)
EOS: 1 %
HEMATOCRIT: 44.8 % (ref 37.5–51.0)
HEMOGLOBIN: 15.2 g/dL (ref 13.0–17.7)
Immature Grans (Abs): 0 10*3/uL (ref 0.0–0.1)
Immature Granulocytes: 0 %
LYMPHS ABS: 1.7 10*3/uL (ref 0.7–3.1)
Lymphs: 24 %
MCH: 30.1 pg (ref 26.6–33.0)
MCHC: 33.9 g/dL (ref 31.5–35.7)
MCV: 89 fL (ref 79–97)
MONOCYTES: 10 %
Monocytes Absolute: 0.7 10*3/uL (ref 0.1–0.9)
NEUTROS ABS: 4.7 10*3/uL (ref 1.4–7.0)
Neutrophils: 64 %
Platelets: 159 10*3/uL (ref 150–450)
RBC: 5.05 x10E6/uL (ref 4.14–5.80)
RDW: 12.7 % (ref 11.6–15.4)
WBC: 7.3 10*3/uL (ref 3.4–10.8)

## 2018-09-07 LAB — FERRITIN: Ferritin: 78 ng/mL (ref 30–400)

## 2018-09-20 ENCOUNTER — Ambulatory Visit (INDEPENDENT_AMBULATORY_CARE_PROVIDER_SITE_OTHER): Payer: Medicare HMO | Admitting: Pharmacist

## 2018-09-20 DIAGNOSIS — Z5181 Encounter for therapeutic drug level monitoring: Secondary | ICD-10-CM

## 2018-09-20 DIAGNOSIS — I4891 Unspecified atrial fibrillation: Secondary | ICD-10-CM | POA: Diagnosis not present

## 2018-09-20 LAB — POCT INR: INR: 1.8 — AB (ref 2.0–3.0)

## 2018-09-20 NOTE — Patient Instructions (Signed)
Description   Take 2 tablets today then increase dose to 1 tablet daily except 1 1/2 tablets on Wednesdays and Sundays  Stop V8 juice Recheck INR 2 wks

## 2018-09-28 DIAGNOSIS — G4733 Obstructive sleep apnea (adult) (pediatric): Secondary | ICD-10-CM | POA: Diagnosis not present

## 2018-10-04 ENCOUNTER — Ambulatory Visit (INDEPENDENT_AMBULATORY_CARE_PROVIDER_SITE_OTHER): Payer: Medicare HMO | Admitting: Pharmacist

## 2018-10-04 DIAGNOSIS — Z5181 Encounter for therapeutic drug level monitoring: Secondary | ICD-10-CM

## 2018-10-04 DIAGNOSIS — I4891 Unspecified atrial fibrillation: Secondary | ICD-10-CM | POA: Diagnosis not present

## 2018-10-04 LAB — POCT INR: INR: 2.5 (ref 2.0–3.0)

## 2018-10-04 NOTE — Patient Instructions (Signed)
Description   Continue 1 tablet daily except 1 1/2 tablets on Wednesdays and Sundays  Recheck INR 3 wks

## 2018-10-11 ENCOUNTER — Ambulatory Visit (HOSPITAL_COMMUNITY)
Admission: RE | Admit: 2018-10-11 | Discharge: 2018-10-11 | Disposition: A | Discharge status: Home / Self Care | Payer: Medicare HMO | Source: Ambulatory Visit | Attending: Urology | Admitting: Urology

## 2018-10-11 ENCOUNTER — Encounter (HOSPITAL_COMMUNITY): Payer: Self-pay

## 2018-10-11 ENCOUNTER — Other Ambulatory Visit: Payer: Self-pay | Admitting: Urology

## 2018-10-11 DIAGNOSIS — R972 Elevated prostate specific antigen [PSA]: Secondary | ICD-10-CM | POA: Diagnosis not present

## 2018-10-11 DIAGNOSIS — C61 Malignant neoplasm of prostate: Secondary | ICD-10-CM | POA: Diagnosis not present

## 2018-10-11 MED ORDER — LIDOCAINE HCL (PF) 2 % IJ SOLN
INTRAMUSCULAR | Status: AC
  Administered 2018-10-11: 12:00:00
  Filled 2018-10-11: qty 10

## 2018-10-11 MED ORDER — GENTAMICIN SULFATE 40 MG/ML IJ SOLN
INTRAMUSCULAR | Status: AC
  Administered 2018-10-11: 80 mg via INTRAMUSCULAR
  Filled 2018-10-11: qty 2

## 2018-10-11 MED ORDER — GENTAMICIN SULFATE 40 MG/ML IJ SOLN
80.0000 mg | Freq: Once | INTRAMUSCULAR | Status: AC
  Administered 2018-10-11: 80 mg via INTRAMUSCULAR

## 2018-10-25 ENCOUNTER — Ambulatory Visit: Payer: Medicare HMO | Admitting: Urology

## 2018-10-25 ENCOUNTER — Ambulatory Visit (INDEPENDENT_AMBULATORY_CARE_PROVIDER_SITE_OTHER): Payer: Medicare HMO | Admitting: *Deleted

## 2018-10-25 DIAGNOSIS — C61 Malignant neoplasm of prostate: Secondary | ICD-10-CM

## 2018-10-25 DIAGNOSIS — Z5181 Encounter for therapeutic drug level monitoring: Secondary | ICD-10-CM | POA: Diagnosis not present

## 2018-10-25 DIAGNOSIS — I4891 Unspecified atrial fibrillation: Secondary | ICD-10-CM | POA: Diagnosis not present

## 2018-10-25 LAB — POCT INR: INR: 2 (ref 2.0–3.0)

## 2018-10-25 NOTE — Patient Instructions (Signed)
Continue 1 tablet daily except 1 1/2 tablets on Wednesdays and Sundays  Recheck INR 4 wks

## 2018-10-27 ENCOUNTER — Other Ambulatory Visit: Payer: Self-pay

## 2018-10-27 MED ORDER — WARFARIN SODIUM 3 MG PO TABS: ORAL_TABLET | ORAL | 1 refills | Status: DC

## 2018-10-28 DIAGNOSIS — G4733 Obstructive sleep apnea (adult) (pediatric): Secondary | ICD-10-CM | POA: Diagnosis not present

## 2018-10-31 ENCOUNTER — Other Ambulatory Visit: Payer: Self-pay | Admitting: *Deleted

## 2018-10-31 MED ORDER — WARFARIN SODIUM 3 MG PO TABS: ORAL_TABLET | ORAL | 2 refills | Status: AC

## 2018-11-21 ENCOUNTER — Telehealth: Payer: Self-pay | Admitting: Pharmacist

## 2018-11-21 NOTE — Telephone Encounter (Signed)
1. Do you currently have a fever? no (yes = cancel and refer to pcp for e-visit) 2. Have you recently travelled on a cruise, internationally, or to [place], [place], [place], [place], [place], or [place], [place] [employer]) ? no (yes = cancel, stay home, monitor symptoms, and contact pcp or initiate e-visit if symptoms develop) 3. Have you been in contact with someone that is currently pending confirmation of Covid19 testing or has been confirmed to have the [place] virus?  no (yes = cancel, stay home, away from tested individual, monitor symptoms, and contact pcp or initiate e-visit if symptoms develop) 4. Are you currently experiencing fatigue or cough? no (yes = pt should be prepared to have a mask placed at the time of their visit).  Pt. Advised that we are restricting visitors at this time and anyone present in the vehicle should meet the above criteria as well. Advised that visit will be at curbside for finger stick ONLY and will receive call with instructions. Pt also advised to please bring own pen for signature of arrival document.   Wife will be coming. No to all questions above

## 2018-11-22 ENCOUNTER — Ambulatory Visit (INDEPENDENT_AMBULATORY_CARE_PROVIDER_SITE_OTHER): Payer: Medicare HMO | Admitting: *Deleted

## 2018-11-22 DIAGNOSIS — Z5181 Encounter for therapeutic drug level monitoring: Secondary | ICD-10-CM | POA: Diagnosis not present

## 2018-11-22 DIAGNOSIS — I4891 Unspecified atrial fibrillation: Secondary | ICD-10-CM

## 2018-11-22 LAB — POCT INR: INR: 3 (ref 2.0–3.0)

## 2018-11-22 NOTE — Patient Instructions (Signed)
Continue 1 tablet daily except 1 1/2 tablets on Wednesdays and Sundays  Recheck INR 4 wks

## 2018-11-27 DIAGNOSIS — G4733 Obstructive sleep apnea (adult) (pediatric): Secondary | ICD-10-CM | POA: Diagnosis not present

## 2018-12-04 ENCOUNTER — Encounter: Payer: Self-pay | Admitting: Radiation Oncology

## 2018-12-04 NOTE — Progress Notes (Addendum)
GU Location of Tumor / Histology: prostatic adenocarcinoma  If Prostate Cancer, Gleason Score is (3 + 4) and PSA is (9.4). Prostate volume: 45g.  Steve Gomez was referred to Dr. Alyson Ingles by Dr. Sinda Du in January 2020 for further evaluation of an elevated PSA. However, notes indicate initial prostate biopsy was done in 1995 at [facility]. Patient denies having pathology report. Patient reports he understands the biopsy done in 1995 was negative.  Biopsies of prostate (if applicable) revealed:    Past/Anticipated interventions by urology, if any: prostate biopsy, referral for consideration of radiotherapy  Past/Anticipated interventions by medical oncology, if any: no  Weight changes, if any: no  Bowel/Bladder complaints, if any:  IPSS 9. SHIM 14. Denies dysuria or hematuria. Denies urinary leakage or incontinence. Denies any bowel complaints.  Nausea/Vomiting, if any: no  Pain issues, if any:  Back pain related to scoliosis.   SAFETY ISSUES:  Prior radiation? no  Pacemaker/ICD? no  Possible current pregnancy? no, male patient  Is the patient on methotrexate? no  Current Complaints / other details:  78 year old male. Married. Retired. Taking Coumadin.   Would prefer treatment in [place].

## 2018-12-05 ENCOUNTER — Encounter: Payer: Self-pay | Admitting: Radiation Oncology

## 2018-12-05 ENCOUNTER — Ambulatory Visit
Admission: RE | Admit: 2018-12-05 | Discharge: 2018-12-05 | Disposition: A | Discharge status: Home / Self Care | Payer: 59 | Source: Ambulatory Visit | Attending: Radiation Oncology | Admitting: Radiation Oncology

## 2018-12-05 ENCOUNTER — Other Ambulatory Visit: Payer: Self-pay

## 2018-12-05 VITALS — Ht 64.0 in | Wt 155.0 lb

## 2018-12-05 DIAGNOSIS — C61 Malignant neoplasm of prostate: Secondary | ICD-10-CM

## 2018-12-05 DIAGNOSIS — R972 Elevated prostate specific antigen [PSA]: Secondary | ICD-10-CM | POA: Diagnosis not present

## 2018-12-05 DIAGNOSIS — Z8042 Family history of malignant neoplasm of prostate: Secondary | ICD-10-CM | POA: Diagnosis not present

## 2018-12-05 HISTORY — DX: Malignant neoplasm of prostate: C61

## 2018-12-05 NOTE — Progress Notes (Signed)
See progress note under physician encounter. 

## 2018-12-05 NOTE — Progress Notes (Signed)
Radiation Oncology         [phone] ________________________________  Initial Outpatient Consultation - Conducted via telephone due to current COVID-19 concerns for limiting patient exposure  Name: Steve Gomez MRN: [id number]  Date: 12/05/2018  DOB: 04/05/1941  [id number], Percell Miller, MD  McKenzie, Candee Furbish, MD   REFERRING PHYSICIAN: Cleon Gustin, MD  DIAGNOSIS: 78 y.o. gentleman with Stage T2a adenocarcinoma of the prostate with Gleason score of 3+4, and PSA of 9.4.    ICD-10-CM   1. Malignant neoplasm of prostate [facility]) C61     HISTORY OF PRESENT ILLNESS: Steve Gomez is a 78 y.o. male with a diagnosis of prostate cancer. He was noted to have an elevated PSA of 9.4 in October 2019 by his primary care physician, Dr. Sinda Du.  He has a history of elevated PSA, and notes indicate he had a prior prostate biopsy performed in 1995 at [facility]. The patient does not have the pathology report but understands this biopsy was "negative". Accordingly, he was referred for evaluation in urology by Dr. Alyson Ingles on 09/06/2018, and a digital rectal examination was performed at that time revealing a hard right lobe with a 15 mm prostate nodule in the right apex.  The patient proceeded to transrectal ultrasound with 12 biopsies of the prostate on 10/11/2018.  The prostate volume measured 45 cc.  Out of 12 core biopsies, 1 was positive.  The maximum Gleason score was 3+4, and this was seen in the right base.  Biopsies of prostate revealed:    The patient reviewed the biopsy results with his urologist and he has kindly been referred today for discussion of potential radiation treatment options. The patient is on chronic Coumadin for a history of mesenteric ischemia and reports he is not interested in any surgical procedures such as prostatectomy or brachytherapy. There is a family history of prostate cancer in his brother.   PREVIOUS RADIATION THERAPY: No  PAST MEDICAL HISTORY:   Past Medical History:  Diagnosis Date  . Atherosclerosis   . Bladder outlet obstruction 01/14/2016  . COPD (chronic obstructive pulmonary disease) ([place])   . High cholesterol   . Hypertension   . New onset atrial fibrillation ([place])   . Prostate cancer ([place])   . Renal calculi 01/14/2016  . S/P AAA repair   . Sleep apnea   . Tobacco abuse       PAST SURGICAL HISTORY: Past Surgical History:  Procedure Laterality Date  . ABDOMINAL AORTIC ANEURYSM REPAIR    . CHOLECYSTECTOMY    . COLONOSCOPY N/A 01/27/2016   Procedure: COLONOSCOPY;  Surgeon: [name], MD;  [facility];  Service: Endoscopy;  Laterality: N/A;  . ESOPHAGOGASTRODUODENOSCOPY N/A 01/27/2016   Procedure: ESOPHAGOGASTRODUODENOSCOPY (EGD);  Surgeon: [name], MD;  [facility];  Service: Endoscopy;  Laterality: N/A;  . ESOPHAGOGASTRODUODENOSCOPY N/A 10/13/2016   Procedure: ESOPHAGOGASTRODUODENOSCOPY (EGD);  Surgeon: [name], MD;  [facility];  Service: Endoscopy;  Laterality: N/A;  130   . MESENTERIC ARTERY BYPASS N/A 02/04/2016   Procedure: AORTO-SUPERIOR MESENTERIC ARTERY BYPASS GRAFT USING 6MM X 30 CM HEMASHIELD GOLD GRAFT;  Surgeon: [name], MD;  Location: [facility] OR;  Service: Vascular;  Laterality: N/A;  . PROSTATE BIOPSY      FAMILY HISTORY:  Family History  Problem Relation Age of Onset  . Suicidality Father        Deceased   . Diabetes Brother   . Prostate cancer Brother   .  Diabetes Brother   . Prostate cancer Brother   . Diabetes Brother   . Breast cancer Neg Hx   . Colon cancer Neg Hx   . Pancreatic cancer Neg Hx     SOCIAL HISTORY:  Social History   Socioeconomic History  . Marital status: Married    Spouse name: Not on file  . Number of children: Not on file  . Years of education: Not on file  . Highest education level: Not on file  Occupational History    Comment: retired  Scientific laboratory technician  . Financial resource strain: Not on file  . Food  insecurity:    Worry: Not on file    Inability: Not on file  . Transportation needs:    Medical: Not on file    Non-medical: Not on file  Tobacco Use  . Smoking status: Former Smoker    Packs/day: 2.00    Years: 50.00    Pack years: 100.00    Types: Cigarettes    Start date: 08/30/1960    Last attempt to quit: 08/30/2013    Years since quitting: 5.2  . Smokeless tobacco: Never Used  Substance and Sexual Activity  . Alcohol use: Not Currently    Alcohol/week: 0.0 standard drinks    Comment: "just a couple of swallows of wine every night"  . Drug use: No  . Sexual activity: Not Currently  Lifestyle  . Physical activity:    Days per week: Not on file    Minutes per session: Not on file  . Stress: Not on file  Relationships  . Social connections:    Talks on phone: Not on file    Gets together: Not on file    Attends religious service: Not on file    Active member of club or organization: Not on file    Attends meetings of clubs or organizations: Not on file    Relationship status: Not on file  . Intimate partner violence:    Fear of current or ex partner: Not on file    Emotionally abused: Not on file    Physically abused: Not on file    Forced sexual activity: Not on file  Other Topics Concern  . Not on file  Social History Narrative  . Not on file  Resides in [place], near [place].  ALLERGIES: Patient has no known allergies.  MEDICATIONS:  Current Outpatient Medications  Medication Sig Dispense Refill  . acetaminophen (TYLENOL) 500 MG tablet Take 500 mg by mouth every 6 (six) hours as needed for mild pain or moderate pain.    Marland Kitchen atorvastatin (LIPITOR) 20 MG tablet Take 20 mg by mouth at bedtime.     . chlorthalidone (HYGROTON) 25 MG tablet Take 25 mg by mouth daily.    . famotidine (PEPCID) 20 MG tablet Take 1 tablet (20 mg total) by mouth daily. 30 tablet 0  . Multiple Vitamin (MULTIVITAMIN) capsule Take 1 capsule by mouth daily.    . OXYGEN Inhale 2 L into the  lungs at bedtime.    . tamsulosin (FLOMAX) 0.4 MG CAPS capsule Take 1 capsule (0.4 mg total) by mouth daily. 30 capsule 0  . warfarin (COUMADIN) 3 MG tablet Take 1 tablet daily except 1 1/2 tablets on Sundays and Wednesdays or as directed 100 tablet 2   No current facility-administered medications for this encounter.     REVIEW OF SYSTEMS:  On review of systems, the patient reports that he is doing well overall. He denies any  chest pain, shortness of breath, cough, fevers, chills, night sweats, or unintended weight changes. He denies any bowel disturbances, and denies abdominal pain, nausea or vomiting. He reports back pain related to scoliosis, but denies any new musculoskeletal or joint aches or pains. His IPSS was 9, indicating moderate urinary symptoms with frequency, urgency and nocturia x0-1 despite taking Flomax daily. He denies dysuria, hematuria, leakage or incontinence. His SHIM was 14, indicating he does have mild-moderate erectile dysfunction. A complete review of systems is obtained and is otherwise negative.    PHYSICAL EXAM: Not performed in light of teleconference consult platform.  Wt Readings from Last 3 Encounters:  12/05/18 155 lb (70.3 kg)  06/27/18 148 lb 3.2 oz (67.2 kg)  01/18/18 142 lb (64.4 kg)   Temp Readings from Last 3 Encounters:  10/11/18 98.6 F (37 C) (Oral)  06/27/18 97.6 F (36.4 C) (Oral)  10/07/17 97.8 F (36.6 C) (Oral)   BP Readings from Last 3 Encounters:  10/11/18 140/87  06/27/18 123/68  01/18/18 126/74   Pulse Readings from Last 3 Encounters:  10/11/18 72  06/27/18 87  01/18/18 75   Pain Assessment Pain Score: 2 (related to scoliosis of the spine) Pain Frequency: Intermittent Pain Loc: Back/10   LABORATORY DATA:  Lab Results  Component Value Date   WBC 7.3 09/06/2018   HGB 15.2 09/06/2018   HCT 44.8 09/06/2018   MCV 89 09/06/2018   PLT 159 09/06/2018   Lab Results  Component Value Date   NA 137 02/09/2016   K 4.2  02/09/2016   CL 101 02/09/2016   CO2 31 02/09/2016   Lab Results  Component Value Date   ALT 12 (L) 02/05/2016   AST 16 02/05/2016   ALKPHOS 91 02/05/2016   BILITOT 0.6 02/05/2016     RADIOGRAPHY: No results found.    IMPRESSION/PLAN: This visit was conducted via telephone to spare the patient unnecessary potential exposure in the healthcare setting during the current COVID-19 pandemic.  1. 78 y.o. gentleman with Stage T2a adenocarcinoma of the prostate with Gleason Score of 3+4, and PSA of 9.4. We discussed the patient's workup and outlined the nature of prostate cancer in this setting. The patient's T stage, Gleason's score, and PSA put him into the favorable-intermediate risk group. Accordingly, he is eligible for a variety of potential treatment options including brachytherapy or 5.5 weeks of external radiation. We discussed the available radiation techniques, and focused on the details and logistics and delivery. We discussed and outlined the risks, benefits, short and long-term effects associated with radiotherapy and compared and contrasted these with prostatectomy. We also discussed the role of SpaceOAR in reducing the rectal toxicity associated with radiotherapy.  At the end of the conversation the patient is interested in moving forward with 5.5 weeks of external beam therapy. The patient would like to receive radiation treatment closer to home in [place]. We will make the referral to Dr. Francesca Jewett in Radiation Oncology at [facility]. We will also contact [facility] to make arrangements for fiducial marker placement, with or without SpaceOAR gel, depending on Dr. Ezzard Standing recommendations/preference. The patient was encouraged to call us if he has any questions or issues in the interim.  Given current concerns for patient exposure during the COVID-19 pandemic, this encounter was conducted via telephone. The patient was notified in advance and was offered a [place] meeting to  allow for face to face communication but unfortunately reported that he did not have the appropriate resources/technology to support such  a visit and instead preferred to proceed with telephone consult. The patient has given verbal consent for this type of encounter. The time spent during this encounter was 50 minutes. The attendants for this meeting include Tyler Pita MD, Ashlyn Bruning PA-C, Rae Lips- scribe, patient Isidoro Santillana and his wife. During the encounter, Tyler Pita MD, Ashlyn Bruning PA-C, and scribe, Rae Lips were located at [facility] Radiation Oncology Department.  Patient Kemani Demarais and wife were located at home.    Nicholos Johns, PA-C    Tyler Pita, MD  [facility] Direct Dial: [phone]  Fax: [phone] [email or web address]  Skype  [email or web address]  This document serves as a record of services personally performed by Tyler Pita, MD and Freeman Caldron, PA-C. It was created on their behalf by Rae Lips, a trained medical scribe. The creation of this record is based on the scribe's personal observations and the providers' statements to them. This document has been checked and approved by the attending providers.

## 2018-12-06 DIAGNOSIS — C61 Malignant neoplasm of prostate: Secondary | ICD-10-CM | POA: Insufficient documentation

## 2018-12-07 ENCOUNTER — Telehealth: Payer: Self-pay | Admitting: Medical Oncology

## 2018-12-07 NOTE — Telephone Encounter (Signed)
Called Steve Gomez, spoke with [name] to introduce myself as the prostate nurse navigator and my role. I was unable to meet them 4/7 because consult was conducted via telephone  due to current COVID-19 concerns for limiting patient exposure. She states the visit went well but she did have questions about gold markers. I discussed fiducial markers/Spaceoar placement and why we use them with treatment. She confirmed appointment for [facility] 4/22 and follow up appointment with Dr. Alyson Ingles 4/24. I informed her that I am working remotely and asked her to call my office and leave me a voicemail with questions or concerns. She thanked me following up and voiced understanding of the above,

## 2018-12-19 ENCOUNTER — Telehealth: Payer: Self-pay | Admitting: *Deleted

## 2018-12-19 NOTE — Telephone Encounter (Signed)

## 2018-12-20 ENCOUNTER — Ambulatory Visit (INDEPENDENT_AMBULATORY_CARE_PROVIDER_SITE_OTHER): Payer: 59 | Admitting: *Deleted

## 2018-12-20 DIAGNOSIS — I4891 Unspecified atrial fibrillation: Secondary | ICD-10-CM

## 2018-12-20 DIAGNOSIS — Z87891 Personal history of nicotine dependence: Secondary | ICD-10-CM | POA: Diagnosis not present

## 2018-12-20 DIAGNOSIS — Z5181 Encounter for therapeutic drug level monitoring: Secondary | ICD-10-CM

## 2018-12-20 DIAGNOSIS — C61 Malignant neoplasm of prostate: Secondary | ICD-10-CM | POA: Diagnosis not present

## 2018-12-20 LAB — POCT INR: INR: 4.1 — AB (ref 2.0–3.0)

## 2018-12-20 NOTE — Patient Instructions (Signed)
INR 4.4 not 4.1 Hold coumadin tonight then decrease dose to 1 tablet daily Recheck INR 3 wks

## 2018-12-28 DIAGNOSIS — G4733 Obstructive sleep apnea (adult) (pediatric): Secondary | ICD-10-CM | POA: Diagnosis not present

## 2019-01-03 ENCOUNTER — Ambulatory Visit (INDEPENDENT_AMBULATORY_CARE_PROVIDER_SITE_OTHER): Payer: Medicare HMO | Admitting: Urology

## 2019-01-03 DIAGNOSIS — C61 Malignant neoplasm of prostate: Secondary | ICD-10-CM | POA: Diagnosis not present

## 2019-01-09 ENCOUNTER — Telehealth: Payer: Self-pay | Admitting: *Deleted

## 2019-01-09 NOTE — Telephone Encounter (Signed)

## 2019-01-10 ENCOUNTER — Ambulatory Visit (INDEPENDENT_AMBULATORY_CARE_PROVIDER_SITE_OTHER): Payer: Medicare HMO | Admitting: *Deleted

## 2019-01-10 DIAGNOSIS — Z5181 Encounter for therapeutic drug level monitoring: Secondary | ICD-10-CM

## 2019-01-10 DIAGNOSIS — I4891 Unspecified atrial fibrillation: Secondary | ICD-10-CM

## 2019-01-10 LAB — POCT INR: INR: 2.5 (ref 2.0–3.0)

## 2019-01-10 NOTE — Patient Instructions (Signed)
Continue 1 tablet daily Recheck INR 4 wks

## 2019-01-11 ENCOUNTER — Telehealth: Payer: Self-pay | Admitting: Cardiovascular Disease

## 2019-01-11 NOTE — Telephone Encounter (Signed)
Virtual Visit Pre-Appointment Phone Call  "(Name), I am calling you today to discuss your upcoming appointment. We are currently trying to limit exposure to the virus that causes COVID-19 by seeing patients at home rather than in the office."  1. "What is the BEST phone number to call the day of the visit?" - include this in appointment notes  2. Do you have or have access to (through a family member/friend) a smartphone with video capability that we can use for your visit?" a. If yes - list this number in appt notes as cell (if different from BEST phone #) and list the appointment type as a VIDEO visit in appointment notes b. If no - list the appointment type as a PHONE visit in appointment notes  3. Confirm consent - "In the setting of the current Covid19 crisis, you are scheduled for a (phone or video) visit with your provider on (date) at (time).  Just as we do with many in-office visits, in order for you to participate in this visit, we must obtain consent.  If you'd like, I can send this to your mychart (if signed up) or email for you to review.  Otherwise, I can obtain your verbal consent now.  All virtual visits are billed to your insurance company just like a normal visit would be.  By agreeing to a virtual visit, we'd like you to understand that the technology does not allow for your provider to perform an examination, and thus may limit your provider's ability to fully assess your condition. If your provider identifies any concerns that need to be evaluated in person, we will make arrangements to do so.  Finally, though the technology is pretty good, we cannot assure that it will always work on either your or our end, and in the setting of a video visit, we may have to convert it to a phone-only visit.  In either situation, we cannot ensure that we have a secure connection.  Are you willing to proceed?" STAFF: Did the patient verbally acknowledge consent to telehealth visit? Document  YES/NO here: Yes  4. Advise patient to be prepared - "Two hours prior to your appointment, go ahead and check your blood pressure, pulse, oxygen saturation, and your weight (if you have the equipment to check those) and write them all down. When your visit starts, your provider will ask you for this information. If you have an Apple Watch or Kardia device, please plan to have heart rate information ready on the day of your appointment. Please have a pen and paper handy nearby the day of the visit as well."  5. Give patient instructions for MyChart download to smartphone OR [email or web address] as below if video visit (depending on what platform provider is using)  6. Inform patient they will receive a phone call 15 minutes prior to their appointment time (may be from unknown caller ID) so they should be prepared to answer    TELEPHONE CALL NOTE  Steve Gomez has been deemed a candidate for a follow-up tele-health visit to limit community exposure during the Covid-19 pandemic. I spoke with the patient via phone to ensure availability of phone/video source, confirm preferred email & phone number, and discuss instructions and expectations.  I reminded Steve Gomez to be prepared with any vital sign and/or heart rhythm information that could potentially be obtained via home monitoring, at the time of his visit. I reminded Steve Gomez to expect a phone call prior to his visit.  Bertram Gala Goins 01/11/2019 1:34 PM

## 2019-01-17 NOTE — Progress Notes (Signed)
Virtual Visit via Telephone Note   This visit type was conducted due to national recommendations for restrictions regarding the COVID-19 Pandemic (e.g. social distancing) in an effort to limit this patient's exposure and mitigate transmission in our community.  Due to his co-morbid illnesses, this patient is at least at moderate risk for complications without adequate follow up.  This format is felt to be most appropriate for this patient at this time.  The patient did not have access to video technology/had technical difficulties with video requiring transitioning to audio format only (telephone).  All issues noted in this document were discussed and addressed.  No physical exam could be performed with this format.  Please refer to the patient's chart for his  consent to telehealth for [facility]   Date:  01/17/2019   ID:  Steve Gomez, DOB 05-13-1941, MRN [id number]  Patient Location: Home Provider Location: Office  PCP:  Sinda Du, MD  Cardiologist:  Jenkins Rouge, MD   Electrophysiologist:  None   Evaluation Performed:  Follow-Up Visit  Chief Complaint:   Afib  History of Present Illness:    Steve Gomez is a 78 y.o. male who presents for ongoing assessment and management of atrial fibrillation, mesenteric stenosis, with bypass, s/p AAA, hypertension, hypercholesterolemia. I have not seen in over 2 years INR;s checked in our office and ok   Echocardiogram 01/14/2016  Left ventricle: The cavity size was normal. Wall thickness was  normal. Systolic function was normal. The estimated ejection  fraction was in the range of 55% to 60%. Wall motion was normal;  there were no regional wall motion abnormalities. - Mitral valve: Calcified annulus. There was moderate  regurgitation. - Tricuspid valve: There was moderate regurgitation.  He is here today without any cardiac complaints. He denies rapid heart rhythm, breathing issues, chest pain, he has no excessive  bleeding or bruising. He is medically compliant.  He has oxygen dependent COPD Was on amiodarone now d/c   Having seed implants for prostate 6/9 Discussed holding coumadin 5 days before   The patient does not have symptoms concerning for COVID-19 infection (fever, chills, cough, or new shortness of breath).    Past Medical History:  Diagnosis Date  . Atherosclerosis   . Bladder outlet obstruction 01/14/2016  . COPD (chronic obstructive pulmonary disease) ([place])   . High cholesterol   . Hypertension   . New onset atrial fibrillation ([place])   . Prostate cancer ([place])   . Renal calculi 01/14/2016  . S/P AAA repair   . Sleep apnea   . Tobacco abuse    Past Surgical History:  Procedure Laterality Date  . ABDOMINAL AORTIC ANEURYSM REPAIR    . CHOLECYSTECTOMY    . COLONOSCOPY N/A 01/27/2016   Procedure: COLONOSCOPY;  Surgeon: [name], MD;  [facility];  Service: Endoscopy;  Laterality: N/A;  . ESOPHAGOGASTRODUODENOSCOPY N/A 01/27/2016   Procedure: ESOPHAGOGASTRODUODENOSCOPY (EGD);  Surgeon: [name], MD;  [facility];  Service: Endoscopy;  Laterality: N/A;  . ESOPHAGOGASTRODUODENOSCOPY N/A 10/13/2016   Procedure: ESOPHAGOGASTRODUODENOSCOPY (EGD);  Surgeon: [name], MD;  [facility];  Service: Endoscopy;  Laterality: N/A;  130   . MESENTERIC ARTERY BYPASS N/A 02/04/2016   Procedure: AORTO-SUPERIOR MESENTERIC ARTERY BYPASS GRAFT USING 6MM X 30 CM HEMASHIELD GOLD GRAFT;  Surgeon: [name], MD;  Location: [facility] OR;  Service: Vascular;  Laterality: N/A;  . PROSTATE BIOPSY       No outpatient medications  have been marked as taking for the 01/19/19 encounter (Appointment) with Josue Hector, MD.     Allergies:   Patient has no known allergies.   Social History   Tobacco Use  . Smoking status: Former Smoker    Packs/day: 2.00    Years: 50.00    Pack years: 100.00    Types: Cigarettes    Start date: 08/30/1960    Last attempt to quit:  08/30/2013    Years since quitting: 5.3  . Smokeless tobacco: Never Used  Substance Use Topics  . Alcohol use: Not Currently    Alcohol/week: 0.0 standard drinks    Comment: "just a couple of swallows of wine every night"  . Drug use: No     Family Hx: The patient's family history includes Diabetes in his brother, brother, and brother; Prostate cancer in his brother and brother; Suicidality in his father. There is no history of Breast cancer, Colon cancer, or Pancreatic cancer.  ROS:   Please see the history of present illness.     All other systems reviewed and are negative.   Prior CV studies:   The following studies were reviewed today:  Echo 01/14/16  Labs/Other Tests and Data Reviewed:    EKG: 01/18/18 SR LAFB LVH   Recent Labs: 09/06/2018: Hemoglobin 15.2; Platelets 159   Recent Lipid Panel Lab Results  Component Value Date/Time   CHOL 77 01/15/2016 02:23 AM   TRIG 107 01/15/2016 02:23 AM   HDL 20 (L) 01/15/2016 02:23 AM   CHOLHDL 3.9 01/15/2016 02:23 AM   LDLCALC 36 01/15/2016 02:23 AM    Wt Readings from Last 3 Encounters:  12/05/18 70.3 kg  06/27/18 67.2 kg  01/18/18 64.4 kg     Objective:    Vital Signs:  There were no vitals taken for this visit.   No exam phone interview   ASSESSMENT & PLAN:    1. PAF:  Maintaining NSR off amiodarone stable 2. HTN:  Well controlled.  Continue current medications and low sodium Dash type diet.   3. HLD:  Continue statin labs with primary 4. COPD:  F/u with Dr Luan Pulling consider f/u PFT;s DLCO with previous amiodarone Rx 4. Prostate Cancer :  Follow up with urology PSA elevated seed implants 02/06/19 Will hold coumadin 5 days before Check INR 02/05/19 day before in [place] and week after coumadin resumed  5. AAA:  Post repair  F/u VVS he has significant SMA/IMA disease and chronic mesenteric ischemia with gut being supplied mostly by collaterals  COVID-19 Education: The signs and symptoms of COVID-19 were discussed with the  patient and how to seek care for testing (follow up with PCP or arrange E-visit).  The importance of social distancing was discussed today.  Time:   Today, I have spent 30 minutes with the patient with telehealth technology discussing the above problems.     Medication Adjustments/Labs and Tests Ordered: Current medicines are reviewed at length with the patient today.  Concerns regarding medicines are outlined above.   Tests Ordered: No orders of the defined types were placed in this encounter.   Medication Changes: No orders of the defined types were placed in this encounter.   Disposition:  Follow up in 6 months   Signed, Jenkins Rouge, MD  01/17/2019 7:55 AM    [place]

## 2019-01-18 ENCOUNTER — Telehealth: Payer: Self-pay | Admitting: *Deleted

## 2019-01-18 NOTE — Telephone Encounter (Signed)
Spoke with pt's wife.  He is scheduled to have seed implants placed on 02/06/19.  Will be off coumadin 5-6 days before procedure.  INR appt for 6/10 moved to 6/18.  Wife verbalized understanding.

## 2019-01-18 NOTE — Telephone Encounter (Signed)
Patient has question about coming off medication for procedure

## 2019-01-19 ENCOUNTER — Telehealth (INDEPENDENT_AMBULATORY_CARE_PROVIDER_SITE_OTHER): Payer: Medicare HMO | Admitting: Cardiovascular Disease

## 2019-01-19 ENCOUNTER — Encounter: Payer: Self-pay | Admitting: Cardiovascular Disease

## 2019-01-19 VITALS — BP 132/71 | HR 90 | Temp 98.0°F | Ht 64.0 in | Wt 150.0 lb

## 2019-01-19 DIAGNOSIS — I480 Paroxysmal atrial fibrillation: Secondary | ICD-10-CM | POA: Diagnosis not present

## 2019-01-19 NOTE — Progress Notes (Signed)
Medication Instructions:  Your physician recommends that you continue on your current medications as directed. Please refer to the Current Medication list given to you today.    Labwork: NONE  Testing/Procedures: NONE  Follow-Up: Your physician recommends that you schedule a follow-up appointment in: June 8TH FOR INR   Your physician wants you to follow-up in: [place] will receive a reminder letter in the mail two months in advance. If you don't receive a letter, please call our office to schedule the follow-up appointment.   Any Other Special Instructions Will Be Listed Below (If Applicable).     If you need a refill on your cardiac medications before your next appointment, please call your pharmacy.

## 2019-02-06 DIAGNOSIS — C61 Malignant neoplasm of prostate: Secondary | ICD-10-CM | POA: Diagnosis not present

## 2019-02-09 DIAGNOSIS — Z510 Encounter for antineoplastic radiation therapy: Secondary | ICD-10-CM | POA: Diagnosis not present

## 2019-02-09 DIAGNOSIS — C61 Malignant neoplasm of prostate: Secondary | ICD-10-CM | POA: Diagnosis not present

## 2019-02-14 ENCOUNTER — Encounter: Payer: Self-pay | Admitting: *Deleted

## 2019-02-14 ENCOUNTER — Telehealth: Payer: Self-pay | Admitting: *Deleted

## 2019-02-14 NOTE — Telephone Encounter (Signed)
° ° °  COVID-19 Pre-Screening Questions:   In the past 7 to 10 days have you had a cough,  shortness of breath, headache, congestion, fever (100 or greater) body aches, chills, sore throat, or sudden loss of taste or sense of smell?NO  Have you been around anyone with known Covid 19.NO    Have you been around anyone who is awaiting Covid 19 test results in the past 7 to 10 days? No    Have you been around anyone who has been exposed to Covid 19, or has mentioned symptoms of Covid 19 within the past 7 to 10 days?No    If you have any concerns/questions about symptoms patients report during screening (either on the phone or at threshold). Contact the provider seeing the patient or DOD for further guidance.  If neither are available contact a member of the leadership team.

## 2019-02-15 ENCOUNTER — Ambulatory Visit (INDEPENDENT_AMBULATORY_CARE_PROVIDER_SITE_OTHER): Payer: Medicare HMO | Admitting: *Deleted

## 2019-02-15 DIAGNOSIS — Z5181 Encounter for therapeutic drug level monitoring: Secondary | ICD-10-CM

## 2019-02-15 DIAGNOSIS — I4891 Unspecified atrial fibrillation: Secondary | ICD-10-CM

## 2019-02-15 LAB — POCT INR: INR: 1.4 — AB (ref 2.0–3.0)

## 2019-02-15 NOTE — Patient Instructions (Signed)
Take coumadin 1 1/2 tablets tonight and tomorrow night then resume 1 tablet daily Recheck INR 3 wks

## 2019-02-19 DIAGNOSIS — C61 Malignant neoplasm of prostate: Secondary | ICD-10-CM | POA: Diagnosis not present

## 2019-02-19 DIAGNOSIS — Z510 Encounter for antineoplastic radiation therapy: Secondary | ICD-10-CM | POA: Diagnosis not present

## 2019-02-21 DIAGNOSIS — Z510 Encounter for antineoplastic radiation therapy: Secondary | ICD-10-CM | POA: Diagnosis not present

## 2019-02-21 DIAGNOSIS — C61 Malignant neoplasm of prostate: Secondary | ICD-10-CM | POA: Diagnosis not present

## 2019-02-26 DIAGNOSIS — C61 Malignant neoplasm of prostate: Secondary | ICD-10-CM | POA: Diagnosis not present

## 2019-02-26 DIAGNOSIS — Z510 Encounter for antineoplastic radiation therapy: Secondary | ICD-10-CM | POA: Diagnosis not present

## 2019-02-27 DIAGNOSIS — Z510 Encounter for antineoplastic radiation therapy: Secondary | ICD-10-CM | POA: Diagnosis not present

## 2019-02-27 DIAGNOSIS — C61 Malignant neoplasm of prostate: Secondary | ICD-10-CM | POA: Diagnosis not present

## 2019-02-28 DIAGNOSIS — Z510 Encounter for antineoplastic radiation therapy: Secondary | ICD-10-CM | POA: Diagnosis not present

## 2019-02-28 DIAGNOSIS — C61 Malignant neoplasm of prostate: Secondary | ICD-10-CM | POA: Diagnosis not present

## 2019-03-01 DIAGNOSIS — Z510 Encounter for antineoplastic radiation therapy: Secondary | ICD-10-CM | POA: Diagnosis not present

## 2019-03-01 DIAGNOSIS — C61 Malignant neoplasm of prostate: Secondary | ICD-10-CM | POA: Diagnosis not present

## 2019-03-05 DIAGNOSIS — C61 Malignant neoplasm of prostate: Secondary | ICD-10-CM | POA: Diagnosis not present

## 2019-03-05 DIAGNOSIS — Z510 Encounter for antineoplastic radiation therapy: Secondary | ICD-10-CM | POA: Diagnosis not present

## 2019-03-06 DIAGNOSIS — I4891 Unspecified atrial fibrillation: Secondary | ICD-10-CM | POA: Diagnosis not present

## 2019-03-06 DIAGNOSIS — Z510 Encounter for antineoplastic radiation therapy: Secondary | ICD-10-CM | POA: Diagnosis not present

## 2019-03-06 DIAGNOSIS — C61 Malignant neoplasm of prostate: Secondary | ICD-10-CM | POA: Diagnosis not present

## 2019-03-06 DIAGNOSIS — Z5181 Encounter for therapeutic drug level monitoring: Secondary | ICD-10-CM | POA: Diagnosis not present

## 2019-03-07 ENCOUNTER — Ambulatory Visit (INDEPENDENT_AMBULATORY_CARE_PROVIDER_SITE_OTHER): Payer: Medicare HMO | Admitting: *Deleted

## 2019-03-07 DIAGNOSIS — Z5181 Encounter for therapeutic drug level monitoring: Secondary | ICD-10-CM

## 2019-03-07 DIAGNOSIS — C61 Malignant neoplasm of prostate: Secondary | ICD-10-CM | POA: Diagnosis not present

## 2019-03-07 DIAGNOSIS — I4891 Unspecified atrial fibrillation: Secondary | ICD-10-CM

## 2019-03-07 DIAGNOSIS — Z510 Encounter for antineoplastic radiation therapy: Secondary | ICD-10-CM | POA: Diagnosis not present

## 2019-03-07 LAB — POCT INR: INR: 2.1 (ref 2.0–3.0)

## 2019-03-07 NOTE — Patient Instructions (Signed)
Continue coumadin 1 tablet daily Recheck INR 4 wks

## 2019-03-08 DIAGNOSIS — Z510 Encounter for antineoplastic radiation therapy: Secondary | ICD-10-CM | POA: Diagnosis not present

## 2019-03-08 DIAGNOSIS — C61 Malignant neoplasm of prostate: Secondary | ICD-10-CM | POA: Diagnosis not present

## 2019-03-09 DIAGNOSIS — C61 Malignant neoplasm of prostate: Secondary | ICD-10-CM | POA: Diagnosis not present

## 2019-03-09 DIAGNOSIS — Z510 Encounter for antineoplastic radiation therapy: Secondary | ICD-10-CM | POA: Diagnosis not present

## 2019-03-13 DIAGNOSIS — C61 Malignant neoplasm of prostate: Secondary | ICD-10-CM | POA: Diagnosis not present

## 2019-03-13 DIAGNOSIS — Z510 Encounter for antineoplastic radiation therapy: Secondary | ICD-10-CM | POA: Diagnosis not present

## 2019-03-14 DIAGNOSIS — Z510 Encounter for antineoplastic radiation therapy: Secondary | ICD-10-CM | POA: Diagnosis not present

## 2019-03-14 DIAGNOSIS — C61 Malignant neoplasm of prostate: Secondary | ICD-10-CM | POA: Diagnosis not present

## 2019-03-15 DIAGNOSIS — C61 Malignant neoplasm of prostate: Secondary | ICD-10-CM | POA: Diagnosis not present

## 2019-03-15 DIAGNOSIS — Z510 Encounter for antineoplastic radiation therapy: Secondary | ICD-10-CM | POA: Diagnosis not present

## 2019-03-16 DIAGNOSIS — C61 Malignant neoplasm of prostate: Secondary | ICD-10-CM | POA: Diagnosis not present

## 2019-03-16 DIAGNOSIS — Z510 Encounter for antineoplastic radiation therapy: Secondary | ICD-10-CM | POA: Diagnosis not present

## 2019-03-19 DIAGNOSIS — R300 Dysuria: Secondary | ICD-10-CM | POA: Diagnosis not present

## 2019-03-19 DIAGNOSIS — C61 Malignant neoplasm of prostate: Secondary | ICD-10-CM | POA: Diagnosis not present

## 2019-03-19 DIAGNOSIS — Z510 Encounter for antineoplastic radiation therapy: Secondary | ICD-10-CM | POA: Diagnosis not present

## 2019-03-20 DIAGNOSIS — C61 Malignant neoplasm of prostate: Secondary | ICD-10-CM | POA: Diagnosis not present

## 2019-03-20 DIAGNOSIS — Z510 Encounter for antineoplastic radiation therapy: Secondary | ICD-10-CM | POA: Diagnosis not present

## 2019-03-21 DIAGNOSIS — C61 Malignant neoplasm of prostate: Secondary | ICD-10-CM | POA: Diagnosis not present

## 2019-03-21 DIAGNOSIS — Z510 Encounter for antineoplastic radiation therapy: Secondary | ICD-10-CM | POA: Diagnosis not present

## 2019-03-22 DIAGNOSIS — Z510 Encounter for antineoplastic radiation therapy: Secondary | ICD-10-CM | POA: Diagnosis not present

## 2019-03-22 DIAGNOSIS — C61 Malignant neoplasm of prostate: Secondary | ICD-10-CM | POA: Diagnosis not present

## 2019-03-23 DIAGNOSIS — Z510 Encounter for antineoplastic radiation therapy: Secondary | ICD-10-CM | POA: Diagnosis not present

## 2019-03-23 DIAGNOSIS — C61 Malignant neoplasm of prostate: Secondary | ICD-10-CM | POA: Diagnosis not present

## 2019-03-26 DIAGNOSIS — C61 Malignant neoplasm of prostate: Secondary | ICD-10-CM | POA: Diagnosis not present

## 2019-03-26 DIAGNOSIS — Z510 Encounter for antineoplastic radiation therapy: Secondary | ICD-10-CM | POA: Diagnosis not present

## 2019-03-27 DIAGNOSIS — Z510 Encounter for antineoplastic radiation therapy: Secondary | ICD-10-CM | POA: Diagnosis not present

## 2019-03-27 DIAGNOSIS — C61 Malignant neoplasm of prostate: Secondary | ICD-10-CM | POA: Diagnosis not present

## 2019-03-28 DIAGNOSIS — C61 Malignant neoplasm of prostate: Secondary | ICD-10-CM | POA: Diagnosis not present

## 2019-03-28 DIAGNOSIS — Z510 Encounter for antineoplastic radiation therapy: Secondary | ICD-10-CM | POA: Diagnosis not present

## 2019-03-29 DIAGNOSIS — C61 Malignant neoplasm of prostate: Secondary | ICD-10-CM | POA: Diagnosis not present

## 2019-03-29 DIAGNOSIS — Z510 Encounter for antineoplastic radiation therapy: Secondary | ICD-10-CM | POA: Diagnosis not present

## 2019-03-30 DIAGNOSIS — Z510 Encounter for antineoplastic radiation therapy: Secondary | ICD-10-CM | POA: Diagnosis not present

## 2019-03-30 DIAGNOSIS — C61 Malignant neoplasm of prostate: Secondary | ICD-10-CM | POA: Diagnosis not present

## 2019-04-02 DIAGNOSIS — Z510 Encounter for antineoplastic radiation therapy: Secondary | ICD-10-CM | POA: Diagnosis not present

## 2019-04-02 DIAGNOSIS — C61 Malignant neoplasm of prostate: Secondary | ICD-10-CM | POA: Diagnosis not present

## 2019-04-03 DIAGNOSIS — Z510 Encounter for antineoplastic radiation therapy: Secondary | ICD-10-CM | POA: Diagnosis not present

## 2019-04-03 DIAGNOSIS — C61 Malignant neoplasm of prostate: Secondary | ICD-10-CM | POA: Diagnosis not present

## 2019-04-04 DIAGNOSIS — I4891 Unspecified atrial fibrillation: Secondary | ICD-10-CM | POA: Diagnosis not present

## 2019-04-04 DIAGNOSIS — Z5181 Encounter for therapeutic drug level monitoring: Secondary | ICD-10-CM | POA: Diagnosis not present

## 2019-04-04 DIAGNOSIS — C61 Malignant neoplasm of prostate: Secondary | ICD-10-CM | POA: Diagnosis not present

## 2019-04-04 DIAGNOSIS — Z510 Encounter for antineoplastic radiation therapy: Secondary | ICD-10-CM | POA: Diagnosis not present

## 2019-04-05 ENCOUNTER — Ambulatory Visit (INDEPENDENT_AMBULATORY_CARE_PROVIDER_SITE_OTHER): Payer: Medicare HMO | Admitting: *Deleted

## 2019-04-05 ENCOUNTER — Other Ambulatory Visit: Payer: Self-pay

## 2019-04-05 DIAGNOSIS — C61 Malignant neoplasm of prostate: Secondary | ICD-10-CM | POA: Diagnosis not present

## 2019-04-05 DIAGNOSIS — Z5181 Encounter for therapeutic drug level monitoring: Secondary | ICD-10-CM

## 2019-04-05 DIAGNOSIS — Z510 Encounter for antineoplastic radiation therapy: Secondary | ICD-10-CM | POA: Diagnosis not present

## 2019-04-05 DIAGNOSIS — I4891 Unspecified atrial fibrillation: Secondary | ICD-10-CM

## 2019-04-05 LAB — POCT INR: INR: 2 (ref 2.0–3.0)

## 2019-04-05 NOTE — Patient Instructions (Signed)
Take coumadin 1 1/2 tablets tonight then resume 1 tablet daily Recheck INR 4 wks

## 2019-04-06 DIAGNOSIS — C61 Malignant neoplasm of prostate: Secondary | ICD-10-CM | POA: Diagnosis not present

## 2019-04-06 DIAGNOSIS — Z510 Encounter for antineoplastic radiation therapy: Secondary | ICD-10-CM | POA: Diagnosis not present

## 2019-04-09 DIAGNOSIS — C61 Malignant neoplasm of prostate: Secondary | ICD-10-CM | POA: Diagnosis not present

## 2019-04-09 DIAGNOSIS — Z510 Encounter for antineoplastic radiation therapy: Secondary | ICD-10-CM | POA: Diagnosis not present

## 2019-04-10 DIAGNOSIS — C61 Malignant neoplasm of prostate: Secondary | ICD-10-CM | POA: Diagnosis not present

## 2019-04-10 DIAGNOSIS — Z510 Encounter for antineoplastic radiation therapy: Secondary | ICD-10-CM | POA: Diagnosis not present

## 2019-04-11 DIAGNOSIS — C61 Malignant neoplasm of prostate: Secondary | ICD-10-CM | POA: Diagnosis not present

## 2019-04-11 DIAGNOSIS — Z510 Encounter for antineoplastic radiation therapy: Secondary | ICD-10-CM | POA: Diagnosis not present

## 2019-04-12 DIAGNOSIS — Z510 Encounter for antineoplastic radiation therapy: Secondary | ICD-10-CM | POA: Diagnosis not present

## 2019-04-12 DIAGNOSIS — C61 Malignant neoplasm of prostate: Secondary | ICD-10-CM | POA: Diagnosis not present

## 2019-04-13 DIAGNOSIS — Z510 Encounter for antineoplastic radiation therapy: Secondary | ICD-10-CM | POA: Diagnosis not present

## 2019-04-13 DIAGNOSIS — C61 Malignant neoplasm of prostate: Secondary | ICD-10-CM | POA: Diagnosis not present

## 2019-04-16 DIAGNOSIS — Z510 Encounter for antineoplastic radiation therapy: Secondary | ICD-10-CM | POA: Diagnosis not present

## 2019-04-16 DIAGNOSIS — C61 Malignant neoplasm of prostate: Secondary | ICD-10-CM | POA: Diagnosis not present

## 2019-04-17 DIAGNOSIS — C61 Malignant neoplasm of prostate: Secondary | ICD-10-CM | POA: Diagnosis not present

## 2019-04-17 DIAGNOSIS — Z510 Encounter for antineoplastic radiation therapy: Secondary | ICD-10-CM | POA: Diagnosis not present

## 2019-04-18 DIAGNOSIS — C61 Malignant neoplasm of prostate: Secondary | ICD-10-CM | POA: Diagnosis not present

## 2019-04-18 DIAGNOSIS — Z510 Encounter for antineoplastic radiation therapy: Secondary | ICD-10-CM | POA: Diagnosis not present

## 2019-04-19 DIAGNOSIS — C61 Malignant neoplasm of prostate: Secondary | ICD-10-CM | POA: Diagnosis not present

## 2019-04-19 DIAGNOSIS — Z510 Encounter for antineoplastic radiation therapy: Secondary | ICD-10-CM | POA: Diagnosis not present

## 2019-04-20 DIAGNOSIS — C61 Malignant neoplasm of prostate: Secondary | ICD-10-CM | POA: Diagnosis not present

## 2019-04-20 DIAGNOSIS — Z510 Encounter for antineoplastic radiation therapy: Secondary | ICD-10-CM | POA: Diagnosis not present

## 2019-04-23 DIAGNOSIS — C61 Malignant neoplasm of prostate: Secondary | ICD-10-CM | POA: Diagnosis not present

## 2019-04-23 DIAGNOSIS — Z510 Encounter for antineoplastic radiation therapy: Secondary | ICD-10-CM | POA: Diagnosis not present

## 2019-04-24 DIAGNOSIS — G4733 Obstructive sleep apnea (adult) (pediatric): Secondary | ICD-10-CM | POA: Diagnosis not present

## 2019-04-24 DIAGNOSIS — C61 Malignant neoplasm of prostate: Secondary | ICD-10-CM | POA: Diagnosis not present

## 2019-04-24 DIAGNOSIS — Z510 Encounter for antineoplastic radiation therapy: Secondary | ICD-10-CM | POA: Diagnosis not present

## 2019-04-25 DIAGNOSIS — C61 Malignant neoplasm of prostate: Secondary | ICD-10-CM | POA: Diagnosis not present

## 2019-04-25 DIAGNOSIS — Z510 Encounter for antineoplastic radiation therapy: Secondary | ICD-10-CM | POA: Diagnosis not present

## 2019-04-26 DIAGNOSIS — Z510 Encounter for antineoplastic radiation therapy: Secondary | ICD-10-CM | POA: Diagnosis not present

## 2019-04-26 DIAGNOSIS — C61 Malignant neoplasm of prostate: Secondary | ICD-10-CM | POA: Diagnosis not present

## 2019-04-27 DIAGNOSIS — Z510 Encounter for antineoplastic radiation therapy: Secondary | ICD-10-CM | POA: Diagnosis not present

## 2019-04-27 DIAGNOSIS — C61 Malignant neoplasm of prostate: Secondary | ICD-10-CM | POA: Diagnosis not present

## 2019-04-30 ENCOUNTER — Ambulatory Visit (INDEPENDENT_AMBULATORY_CARE_PROVIDER_SITE_OTHER): Payer: Medicare HMO | Admitting: *Deleted

## 2019-04-30 ENCOUNTER — Other Ambulatory Visit: Payer: Self-pay

## 2019-04-30 DIAGNOSIS — Z5181 Encounter for therapeutic drug level monitoring: Secondary | ICD-10-CM

## 2019-04-30 DIAGNOSIS — I4891 Unspecified atrial fibrillation: Secondary | ICD-10-CM

## 2019-04-30 DIAGNOSIS — Z510 Encounter for antineoplastic radiation therapy: Secondary | ICD-10-CM | POA: Diagnosis not present

## 2019-04-30 DIAGNOSIS — C61 Malignant neoplasm of prostate: Secondary | ICD-10-CM | POA: Diagnosis not present

## 2019-04-30 LAB — POCT INR: INR: 2.6 (ref 2.0–3.0)

## 2019-04-30 NOTE — Patient Instructions (Signed)
Continue coumadin 1 tablet daily Recheck INR 4 wks  

## 2019-05-09 ENCOUNTER — Ambulatory Visit (INDEPENDENT_AMBULATORY_CARE_PROVIDER_SITE_OTHER): Payer: Medicare HMO | Admitting: Urology

## 2019-05-09 DIAGNOSIS — C61 Malignant neoplasm of prostate: Secondary | ICD-10-CM | POA: Diagnosis not present

## 2019-05-09 DIAGNOSIS — R351 Nocturia: Secondary | ICD-10-CM

## 2019-05-16 NOTE — Telephone Encounter (Signed)
Error

## 2019-05-30 ENCOUNTER — Ambulatory Visit (INDEPENDENT_AMBULATORY_CARE_PROVIDER_SITE_OTHER): Payer: Medicare HMO | Admitting: *Deleted

## 2019-05-30 ENCOUNTER — Other Ambulatory Visit: Payer: Self-pay

## 2019-05-30 DIAGNOSIS — K55059 Acute (reversible) ischemia of intestine, part and extent unspecified: Secondary | ICD-10-CM | POA: Diagnosis not present

## 2019-05-30 DIAGNOSIS — I4891 Unspecified atrial fibrillation: Secondary | ICD-10-CM

## 2019-05-30 DIAGNOSIS — Z5181 Encounter for therapeutic drug level monitoring: Secondary | ICD-10-CM

## 2019-05-30 DIAGNOSIS — I25119 Atherosclerotic heart disease of native coronary artery with unspecified angina pectoris: Secondary | ICD-10-CM | POA: Diagnosis not present

## 2019-05-30 DIAGNOSIS — Z23 Encounter for immunization: Secondary | ICD-10-CM | POA: Diagnosis not present

## 2019-05-30 DIAGNOSIS — C61 Malignant neoplasm of prostate: Secondary | ICD-10-CM | POA: Diagnosis not present

## 2019-05-30 DIAGNOSIS — I10 Essential (primary) hypertension: Secondary | ICD-10-CM | POA: Diagnosis not present

## 2019-05-30 DIAGNOSIS — J449 Chronic obstructive pulmonary disease, unspecified: Secondary | ICD-10-CM | POA: Diagnosis not present

## 2019-05-30 LAB — POCT INR: INR: 3.4 — AB (ref 2.0–3.0)

## 2019-05-30 NOTE — Patient Instructions (Signed)
Hold coumadin tonight then resume 1 tablet daily Recheck INR 4 wks

## 2019-06-02 NOTE — Progress Notes (Signed)
Date:  06/02/2019   ID:  [name], DOB 09-13-1940, MRN [id number]  Provider Location: Office  PCP:  Sinda Du, MD  Cardiologist:  Jenkins Rouge, MD   Electrophysiologist:  None   Evaluation Performed:  Follow-Up Visit  Chief Complaint:   Afib and HTN   History of Present Illness:    Steve Gomez is a 78 y.o. male who presents for ongoing assessment and management of atrial fibrillation, mesenteric stenosis, with bypass, s/p AAA, hypertension, hypercholesterolemia. INR;s checked in our office and ok   Echocardiogram 01/14/2016  Left ventricle: The cavity size was normal. Wall thickness was  normal. Systolic function was normal. The estimated ejection  fraction was in the range of 55% to 60%. Wall motion was normal;  there were no regional wall motion abnormalities. - Mitral valve: Calcified annulus. There was moderate  regurgitation. - Tricuspid valve: There was moderate regurgitation..  He has oxygen dependent COPD Was on amiodarone now d/c  Prostate CA with seed implants BP has been elevated recently Only on a diuretic   Was in rapid afib rate of 97 in office today Unaware Discussed need for med changes and possible DCC In 3 weeks Also discussed need for further w/u HTN   The patient does not have symptoms concerning for COVID-19 infection (fever, chills, cough, or new shortness of breath).    Past Medical History:  Diagnosis Date  . Atherosclerosis   . Bladder outlet obstruction 01/14/2016  . COPD (chronic obstructive pulmonary disease) ([place])   . High cholesterol   . Hypertension   . New onset atrial fibrillation ([place])   . Prostate cancer ([place])   . Renal calculi 01/14/2016  . S/P AAA repair   . Sleep apnea   . Tobacco abuse    Past Surgical History:  Procedure Laterality Date  . ABDOMINAL AORTIC ANEURYSM REPAIR    . CHOLECYSTECTOMY    . COLONOSCOPY N/A 01/27/2016   Procedure: COLONOSCOPY;  Surgeon: [name], MD;  [facility];  Service: Endoscopy;  Laterality: N/A;  . ESOPHAGOGASTRODUODENOSCOPY N/A 01/27/2016   Procedure: ESOPHAGOGASTRODUODENOSCOPY (EGD);  Surgeon: [name], MD;  [facility];  Service: Endoscopy;  Laterality: N/A;  . ESOPHAGOGASTRODUODENOSCOPY N/A 10/13/2016   Procedure: ESOPHAGOGASTRODUODENOSCOPY (EGD);  Surgeon: [name], MD;  [facility];  Service: Endoscopy;  Laterality: N/A;  130   . MESENTERIC ARTERY BYPASS N/A 02/04/2016   Procedure: AORTO-SUPERIOR MESENTERIC ARTERY BYPASS GRAFT USING 6MM X 30 CM HEMASHIELD GOLD GRAFT;  Surgeon: [name], MD;  Location: [facility] OR;  Service: Vascular;  Laterality: N/A;  . PROSTATE BIOPSY       No outpatient medications have been marked as taking for the 06/05/19 encounter (Appointment) with Josue Hector, MD.     Allergies:   Patient has no known allergies.   Social History   Tobacco Use  . Smoking status: Former Smoker    Packs/day: 2.00    Years: 50.00    Pack years: 100.00    Types: Cigarettes    Start date: 08/30/1960    Quit date: 08/30/2013    Years since quitting: 5.7  . Smokeless tobacco: Never Used  Substance Use Topics  . Alcohol use: Not Currently    Alcohol/week: 0.0 standard drinks    Comment: "just a couple of swallows of wine every night"  . Drug use: No     Family Hx: The patient's family history includes Diabetes in his brother, brother,  and brother; Prostate cancer in his brother and brother; Suicidality in his father. There is no history of Breast cancer, Colon cancer, or Pancreatic cancer.  ROS:   Please see the history of present illness.     All other systems reviewed and are negative.   Prior CV studies:   The following studies were reviewed today:  Echo 01/14/16  Labs/Other Tests and Data Reviewed:    EKG: 01/18/18 SR LAFB LVH   Recent Labs: 09/06/2018: Hemoglobin 15.2; Platelets 159   Recent Lipid Panel Lab Results  Component Value Date/Time   CHOL 77 01/15/2016 02:23  AM   TRIG 107 01/15/2016 02:23 AM   HDL 20 (L) 01/15/2016 02:23 AM   CHOLHDL 3.9 01/15/2016 02:23 AM   LDLCALC 36 01/15/2016 02:23 AM    Wt Readings from Last 3 Encounters:  01/19/19 150 lb (68 kg)  12/05/18 155 lb (70.3 kg)  06/27/18 148 lb 3.2 oz (67.2 kg)     Objective:    Vital Signs:  There were no vitals taken for this visit.   Affect appropriate Healthy:  appears stated age 78: normal Neck supple with no adenopathy JVP normal no bruits no thyromegaly Lungs clear with no wheezing and good diaphragmatic motion Heart:  S1/S2 no murmur, no rub, gallop or click PMI normal Abdomen: benighn, BS positve, no tenderness, no AAA no bruit.  No HSM or HJR Distal pulses intact with no bruits No edema Neuro non-focal Skin warm and dry No muscular weakness   ASSESSMENT & PLAN:    1. PAF:  ? Bleeding on DOAC in past check INR q week x 3 in anticipation of [facility] Start lopressor 25 bid for rate control and Multaq 400 bid as AAT f/u 3-4 weeks to see if [facility] needs to be arranged  2. HTN:  Adding lopressor see #1  US abdomen to r/o RAS see #5  3. HLD:  Continue statin labs with primary 4. COPD:  F/u with Dr Luan Pulling consider f/u PFT;s DLCO with previous amiodarone Rx 4. Prostate Cancer :  Follow up with urology PSA elevated seed implants 02/06/19 5. AAA:  Post repair  F/u VVS he has significant SMA/IMA disease and chronic mesenteric ischemia with gut being supplied mostly by collaterals Sees Dr Early VVS will refer back to him and check abdominal US for RAS And mesenteric anatomy   COVID-19 Education: The signs and symptoms of COVID-19 were discussed with the patient and how to seek care for testing (follow up with PCP or arrange E-visit).  The importance of social distancing was discussed today.  Time:   Today, I have spent 45 minutes     Medication Adjustments/Labs and Tests Ordered:  INR q week x 3 [place] RAS Lopressor 25 bid Multaq 400 bid   Tests Ordered:  Abdominal US for  RAS and mesenteric ischemia   Medication Changes: No orders of the defined types were placed in this encounter.   Disposition:  Follow up in 3-4 weeks   Signed, Jenkins Rouge, MD  06/02/2019 7:44 PM    [place]

## 2019-06-05 ENCOUNTER — Ambulatory Visit (INDEPENDENT_AMBULATORY_CARE_PROVIDER_SITE_OTHER): Payer: Medicare HMO | Admitting: Cardiovascular Disease

## 2019-06-05 ENCOUNTER — Encounter: Payer: Self-pay | Admitting: Cardiovascular Disease

## 2019-06-05 ENCOUNTER — Other Ambulatory Visit: Payer: Self-pay

## 2019-06-05 VITALS — BP 154/106 | HR 69 | Temp 97.9°F | Ht 64.0 in | Wt 160.0 lb

## 2019-06-05 DIAGNOSIS — I10 Essential (primary) hypertension: Secondary | ICD-10-CM | POA: Diagnosis not present

## 2019-06-05 DIAGNOSIS — I4891 Unspecified atrial fibrillation: Secondary | ICD-10-CM | POA: Diagnosis not present

## 2019-06-05 MED ORDER — MULTAQ 400 MG PO TABS: 400.0000 mg | ORAL_TABLET | Freq: Two times a day (BID) | ORAL | 3 refills | Status: AC

## 2019-06-05 MED ORDER — METOPROLOL TARTRATE 25 MG PO TABS: 25.0000 mg | ORAL_TABLET | Freq: Two times a day (BID) | ORAL | 3 refills | Status: AC

## 2019-06-05 NOTE — Patient Instructions (Signed)
Medication Instructions:  START MULTAQ 400 MG - TWO TIMES DAILY   START LOPRESSOR 25 MG - TWO TIMES DAILY   Labwork: NONE  Testing/Procedures: Your physician has requested that you have a renal artery duplex. During this test, an ultrasound is used to evaluate blood flow to the kidneys. Allow one hour for this exam. Do not eat after midnight the day before and avoid carbonated beverages. Take your medications as you usually do.    Follow-Up: Your physician recommends that you schedule a follow-up appointment in: 3-4 WEEKS    Any Other Special Instructions Will Be Listed Below (If Applicable).     If you need a refill on your cardiac medications before your next appointment, please call your pharmacy.

## 2019-06-06 ENCOUNTER — Telehealth: Payer: Self-pay | Admitting: Cardiovascular Disease

## 2019-06-06 DIAGNOSIS — I10 Essential (primary) hypertension: Secondary | ICD-10-CM

## 2019-06-06 DIAGNOSIS — I4891 Unspecified atrial fibrillation: Secondary | ICD-10-CM

## 2019-06-06 MED ORDER — APIXABAN 5 MG PO TABS: 5.0000 mg | ORAL_TABLET | Freq: Two times a day (BID) | ORAL | 3 refills | Status: AC

## 2019-06-06 MED ORDER — APIXABAN 5 MG PO TABS: 5.0000 mg | ORAL_TABLET | Freq: Two times a day (BID) | ORAL | 3 refills | Status: DC

## 2019-06-06 NOTE — Telephone Encounter (Signed)
Per phone call from pt's wife- -Pt would like to go ahead and start the Rx for his Eliquis and would need a 90 day Rx because it's cheaper that way.   Please send to [facility] mail order pharmacy

## 2019-06-06 NOTE — Telephone Encounter (Signed)
LVM for patient to call and schedule his renal artery ultrasound at the [facility] office.

## 2019-06-06 NOTE — Telephone Encounter (Signed)
Needs Bmet and can start eliquis 5 bid INR was Rx 9/30

## 2019-06-06 NOTE — Telephone Encounter (Signed)
Pt would like to start Eliquis. Will forward to Dr. Johnsie Cancel to advise.

## 2019-06-06 NOTE — Telephone Encounter (Signed)
Pt's wife made aware. She will come by office to get lab slip. Rx for Eliquis was sent to [facility] .

## 2019-06-07 ENCOUNTER — Other Ambulatory Visit (HOSPITAL_COMMUNITY)
Admission: RE | Admit: 2019-06-07 | Discharge: 2019-06-07 | Disposition: A | Discharge status: Home / Self Care | Payer: Medicare HMO | Source: Ambulatory Visit | Attending: Pulmonary Disease | Admitting: Pulmonary Disease

## 2019-06-07 ENCOUNTER — Other Ambulatory Visit (HOSPITAL_COMMUNITY)
Admission: RE | Admit: 2019-06-07 | Discharge: 2019-06-07 | Disposition: A | Discharge status: Home / Self Care | Payer: Medicare HMO | Source: Ambulatory Visit | Attending: Cardiovascular Disease | Admitting: Cardiovascular Disease

## 2019-06-07 DIAGNOSIS — J449 Chronic obstructive pulmonary disease, unspecified: Secondary | ICD-10-CM | POA: Insufficient documentation

## 2019-06-07 DIAGNOSIS — Z0000 Encounter for general adult medical examination without abnormal findings: Secondary | ICD-10-CM | POA: Diagnosis not present

## 2019-06-07 DIAGNOSIS — I10 Essential (primary) hypertension: Secondary | ICD-10-CM | POA: Insufficient documentation

## 2019-06-07 DIAGNOSIS — I25119 Atherosclerotic heart disease of native coronary artery with unspecified angina pectoris: Secondary | ICD-10-CM | POA: Diagnosis not present

## 2019-06-07 DIAGNOSIS — K55059 Acute (reversible) ischemia of intestine, part and extent unspecified: Secondary | ICD-10-CM | POA: Insufficient documentation

## 2019-06-07 LAB — COMPREHENSIVE METABOLIC PANEL
ALT: 41 U/L (ref 0–44)
AST: 54 U/L — ABNORMAL HIGH (ref 15–41)
Albumin: 3.9 g/dL (ref 3.5–5.0)
Alkaline Phosphatase: 61 U/L (ref 38–126)
Anion gap: 12 (ref 5–15)
BUN: 33 mg/dL — ABNORMAL HIGH (ref 8–23)
CO2: 27 mmol/L (ref 22–32)
Calcium: 9.2 mg/dL (ref 8.9–10.3)
Chloride: 98 mmol/L (ref 98–111)
Creatinine, Ser: 1.63 mg/dL — ABNORMAL HIGH (ref 0.61–1.24)
GFR calc Af Amer: 46 mL/min — ABNORMAL LOW (ref >60)
GFR calc non Af Amer: 40 mL/min — ABNORMAL LOW (ref >60)
Glucose, Bld: 130 mg/dL — ABNORMAL HIGH (ref 70–99)
Potassium: 4.1 mmol/L (ref 3.5–5.1)
Sodium: 137 mmol/L (ref 135–145)
Total Bilirubin: 0.8 mg/dL (ref 0.3–1.2)
Total Protein: 6.6 g/dL (ref 6.5–8.1)

## 2019-06-07 LAB — CBC WITH DIFFERENTIAL/PLATELET
Abs Immature Granulocytes: 0.02 10*3/uL (ref 0.00–0.07)
Basophils Absolute: 0 10*3/uL (ref 0.0–0.1)
Basophils Relative: 1 %
Eosinophils Absolute: 0 10*3/uL (ref 0.0–0.5)
Eosinophils Relative: 1 %
HCT: 47.6 % (ref 39.0–52.0)
Hemoglobin: 15 g/dL (ref 13.0–17.0)
Immature Granulocytes: 0 %
Lymphocytes Relative: 15 %
Lymphs Abs: 1 10*3/uL (ref 0.7–4.0)
MCH: 30.2 pg (ref 26.0–34.0)
MCHC: 31.5 g/dL (ref 30.0–36.0)
MCV: 95.8 fL (ref 80.0–100.0)
Monocytes Absolute: 0.7 10*3/uL (ref 0.1–1.0)
Monocytes Relative: 11 %
Neutro Abs: 4.9 10*3/uL (ref 1.7–7.7)
Neutrophils Relative %: 72 %
Platelets: 136 10*3/uL — ABNORMAL LOW (ref 150–400)
RBC: 4.97 MIL/uL (ref 4.22–5.81)
RDW: 13.4 % (ref 11.5–15.5)
WBC: 6.6 10*3/uL (ref 4.0–10.5)
nRBC: 0 % (ref 0.0–0.2)

## 2019-06-07 LAB — LIPID PANEL
Cholesterol: 92 mg/dL (ref 0–200)
HDL: 34 mg/dL — ABNORMAL LOW (ref >40)
LDL Cholesterol: 41 mg/dL (ref 0–99)
Total CHOL/HDL Ratio: 2.7 RATIO
Triglycerides: 83 mg/dL (ref <150)
VLDL: 17 mg/dL (ref 0–40)

## 2019-06-07 LAB — TSH: TSH: 1.586 u[IU]/mL (ref 0.350–4.500)

## 2019-06-08 ENCOUNTER — Telehealth: Payer: Self-pay | Admitting: Cardiovascular Disease

## 2019-06-08 NOTE — Telephone Encounter (Signed)
Wife wanted to make sure the metoprolol and chlorthalidone could be taken together. I told he her that is correct

## 2019-06-08 NOTE — Telephone Encounter (Signed)
Pt's wife would like to speak with nurse regarding pt's meds,  [phone]

## 2019-06-09 DIAGNOSIS — E7800 Pure hypercholesterolemia, unspecified: Secondary | ICD-10-CM | POA: Diagnosis not present

## 2019-06-09 DIAGNOSIS — Z86718 Personal history of other venous thrombosis and embolism: Secondary | ICD-10-CM | POA: Diagnosis not present

## 2019-06-09 DIAGNOSIS — Z79899 Other long term (current) drug therapy: Secondary | ICD-10-CM | POA: Diagnosis not present

## 2019-06-09 DIAGNOSIS — I469 Cardiac arrest, cause unspecified: Secondary | ICD-10-CM | POA: Diagnosis not present

## 2019-06-09 DIAGNOSIS — R4020 Unspecified coma: Secondary | ICD-10-CM | POA: Diagnosis not present

## 2019-06-09 DIAGNOSIS — I10 Essential (primary) hypertension: Secondary | ICD-10-CM | POA: Diagnosis not present

## 2019-06-09 DIAGNOSIS — R0902 Hypoxemia: Secondary | ICD-10-CM | POA: Diagnosis not present

## 2019-06-09 DIAGNOSIS — R0681 Apnea, not elsewhere classified: Secondary | ICD-10-CM | POA: Diagnosis not present

## 2019-06-09 DIAGNOSIS — R531 Weakness: Secondary | ICD-10-CM | POA: Diagnosis not present

## 2019-06-09 DIAGNOSIS — I499 Cardiac arrhythmia, unspecified: Secondary | ICD-10-CM | POA: Diagnosis not present

## 2019-06-09 DIAGNOSIS — R404 Transient alteration of awareness: Secondary | ICD-10-CM | POA: Diagnosis not present

## 2019-06-09 DIAGNOSIS — Z7901 Long term (current) use of anticoagulants: Secondary | ICD-10-CM | POA: Diagnosis not present

## 2019-06-09 DIAGNOSIS — Z66 Do not resuscitate: Secondary | ICD-10-CM | POA: Diagnosis not present

## 2019-06-09 DIAGNOSIS — Z8546 Personal history of malignant neoplasm of prostate: Secondary | ICD-10-CM | POA: Diagnosis not present

## 2019-06-15 ENCOUNTER — Encounter (HOSPITAL_COMMUNITY): Payer: Medicare HMO

## 2019-07-01 DEATH — deceased

## 2019-07-02 ENCOUNTER — Ambulatory Visit: Payer: Medicare HMO | Admitting: Cardiovascular Disease

## 2019-08-21 ENCOUNTER — Ambulatory Visit: Payer: Medicare HMO | Admitting: Cardiovascular Disease
